# Patient Record
Sex: Female | Born: 1975 | Race: Black or African American | Hispanic: No | Marital: Single | Smoking: Current every day smoker
Health system: Southern US, Community
[De-identification: ages and names within clinical notes are randomized; demographics above are authoritative.]

## PROBLEM LIST (undated history)

## (undated) DIAGNOSIS — L732 Hidradenitis suppurativa: Secondary | ICD-10-CM

## (undated) DIAGNOSIS — M2550 Pain in unspecified joint: Secondary | ICD-10-CM

## (undated) DIAGNOSIS — M069 Rheumatoid arthritis, unspecified: Secondary | ICD-10-CM

## (undated) DIAGNOSIS — L0292 Furuncle, unspecified: Secondary | ICD-10-CM

## (undated) DIAGNOSIS — I10 Essential (primary) hypertension: Secondary | ICD-10-CM

## (undated) HISTORY — DX: Hidradenitis suppurativa: L73.2

## (undated) HISTORY — DX: Rheumatoid arthritis, unspecified: M06.9

## (undated) HISTORY — DX: Furuncle, unspecified: L02.92

## (undated) HISTORY — PX: AXILLARY HIDRADENITIS EXCISION: SUR522

## (undated) HISTORY — PX: OTHER SURGICAL HISTORY: SHX169

## (undated) NOTE — *Deleted (*Deleted)
Medical screening examination/treatment/procedure(s) were conducted as a shared visit with non-physician practitioner(s) and myself.  I personally evaluated the patient during the encounter  

---

## 1998-03-26 ENCOUNTER — Ambulatory Visit (HOSPITAL_COMMUNITY): Admission: RE | Admit: 1998-03-26 | Discharge: 1998-03-26 | Payer: Self-pay | Admitting: Obstetrics & Gynecology

## 1998-07-09 ENCOUNTER — Ambulatory Visit (HOSPITAL_COMMUNITY): Admission: RE | Admit: 1998-07-09 | Discharge: 1998-07-09 | Payer: Self-pay | Admitting: *Deleted

## 1998-08-20 ENCOUNTER — Inpatient Hospital Stay (HOSPITAL_COMMUNITY): Admission: AD | Admit: 1998-08-20 | Discharge: 1998-08-25 | Payer: Self-pay | Admitting: Obstetrics

## 1999-01-08 ENCOUNTER — Emergency Department (HOSPITAL_COMMUNITY): Admission: EM | Admit: 1999-01-08 | Discharge: 1999-01-08 | Payer: Self-pay | Admitting: Emergency Medicine

## 1999-03-31 ENCOUNTER — Emergency Department (HOSPITAL_COMMUNITY): Admission: EM | Admit: 1999-03-31 | Discharge: 1999-04-01 | Payer: Self-pay | Admitting: Emergency Medicine

## 1999-04-01 ENCOUNTER — Emergency Department (HOSPITAL_COMMUNITY): Admission: EM | Admit: 1999-04-01 | Discharge: 1999-04-01 | Payer: Self-pay | Admitting: Emergency Medicine

## 1999-06-03 ENCOUNTER — Emergency Department (HOSPITAL_COMMUNITY): Admission: EM | Admit: 1999-06-03 | Discharge: 1999-06-03 | Payer: Self-pay | Admitting: Emergency Medicine

## 1999-06-04 ENCOUNTER — Other Ambulatory Visit: Admission: RE | Admit: 1999-06-04 | Discharge: 1999-06-04 | Payer: Self-pay | Admitting: Obstetrics

## 1999-06-06 ENCOUNTER — Encounter (INDEPENDENT_AMBULATORY_CARE_PROVIDER_SITE_OTHER): Payer: Self-pay

## 1999-06-06 ENCOUNTER — Other Ambulatory Visit: Admission: RE | Admit: 1999-06-06 | Discharge: 1999-06-06 | Payer: Self-pay | Admitting: Obstetrics

## 2000-01-18 ENCOUNTER — Emergency Department (HOSPITAL_COMMUNITY): Admission: EM | Admit: 2000-01-18 | Discharge: 2000-01-18 | Payer: Self-pay | Admitting: Emergency Medicine

## 2000-02-14 ENCOUNTER — Ambulatory Visit (HOSPITAL_BASED_OUTPATIENT_CLINIC_OR_DEPARTMENT_OTHER): Admission: RE | Admit: 2000-02-14 | Discharge: 2000-02-14 | Payer: Self-pay | Admitting: General Surgery

## 2000-02-14 ENCOUNTER — Encounter (INDEPENDENT_AMBULATORY_CARE_PROVIDER_SITE_OTHER): Payer: Self-pay | Admitting: *Deleted

## 2003-04-03 ENCOUNTER — Ambulatory Visit (HOSPITAL_BASED_OUTPATIENT_CLINIC_OR_DEPARTMENT_OTHER): Admission: RE | Admit: 2003-04-03 | Discharge: 2003-04-03 | Payer: Self-pay | Admitting: Orthopedic Surgery

## 2003-04-03 ENCOUNTER — Encounter (INDEPENDENT_AMBULATORY_CARE_PROVIDER_SITE_OTHER): Payer: Self-pay | Admitting: *Deleted

## 2003-09-30 ENCOUNTER — Encounter: Admission: RE | Admit: 2003-09-30 | Discharge: 2003-09-30 | Payer: Self-pay | Admitting: Family Medicine

## 2004-01-28 ENCOUNTER — Encounter: Admission: RE | Admit: 2004-01-28 | Discharge: 2004-01-28 | Payer: Self-pay | Admitting: Family Medicine

## 2004-01-30 ENCOUNTER — Encounter: Admission: RE | Admit: 2004-01-30 | Discharge: 2004-01-30 | Payer: Self-pay | Admitting: Family Medicine

## 2004-05-06 ENCOUNTER — Encounter: Admission: RE | Admit: 2004-05-06 | Discharge: 2004-05-06 | Payer: Self-pay | Admitting: Sports Medicine

## 2004-07-01 ENCOUNTER — Encounter: Admission: RE | Admit: 2004-07-01 | Discharge: 2004-07-01 | Payer: Self-pay | Admitting: Family Medicine

## 2004-09-22 ENCOUNTER — Encounter (INDEPENDENT_AMBULATORY_CARE_PROVIDER_SITE_OTHER): Payer: Self-pay | Admitting: *Deleted

## 2004-09-22 LAB — CONVERTED CEMR LAB

## 2004-09-23 ENCOUNTER — Other Ambulatory Visit: Admission: RE | Admit: 2004-09-23 | Discharge: 2004-09-23 | Payer: Self-pay | Admitting: Family Medicine

## 2004-09-23 ENCOUNTER — Ambulatory Visit: Payer: Self-pay | Admitting: Family Medicine

## 2004-09-30 ENCOUNTER — Ambulatory Visit: Payer: Self-pay | Admitting: Family Medicine

## 2005-04-29 ENCOUNTER — Emergency Department (HOSPITAL_COMMUNITY): Admission: EM | Admit: 2005-04-29 | Discharge: 2005-04-29 | Payer: Self-pay | Admitting: Family Medicine

## 2006-04-24 ENCOUNTER — Emergency Department (HOSPITAL_COMMUNITY): Admission: EM | Admit: 2006-04-24 | Discharge: 2006-04-24 | Payer: Self-pay | Admitting: Emergency Medicine

## 2006-05-08 ENCOUNTER — Ambulatory Visit: Payer: Self-pay | Admitting: Sports Medicine

## 2006-05-12 ENCOUNTER — Encounter: Admission: RE | Admit: 2006-05-12 | Discharge: 2006-05-12 | Payer: Self-pay | Admitting: Sports Medicine

## 2006-05-12 ENCOUNTER — Ambulatory Visit: Payer: Self-pay | Admitting: Family Medicine

## 2006-06-23 ENCOUNTER — Emergency Department (HOSPITAL_COMMUNITY): Admission: EM | Admit: 2006-06-23 | Discharge: 2006-06-23 | Payer: Self-pay | Admitting: Family Medicine

## 2006-07-10 ENCOUNTER — Ambulatory Visit (HOSPITAL_BASED_OUTPATIENT_CLINIC_OR_DEPARTMENT_OTHER): Admission: RE | Admit: 2006-07-10 | Discharge: 2006-07-11 | Payer: Self-pay | Admitting: Specialist

## 2006-07-10 ENCOUNTER — Encounter (INDEPENDENT_AMBULATORY_CARE_PROVIDER_SITE_OTHER): Payer: Self-pay | Admitting: Specialist

## 2006-07-23 ENCOUNTER — Emergency Department (HOSPITAL_COMMUNITY): Admission: EM | Admit: 2006-07-23 | Discharge: 2006-07-23 | Payer: Self-pay | Admitting: Emergency Medicine

## 2006-08-03 ENCOUNTER — Ambulatory Visit: Payer: Self-pay | Admitting: Family Medicine

## 2006-08-03 ENCOUNTER — Inpatient Hospital Stay (HOSPITAL_COMMUNITY): Admission: EM | Admit: 2006-08-03 | Discharge: 2006-08-06 | Payer: Self-pay | Admitting: Emergency Medicine

## 2006-08-03 ENCOUNTER — Encounter: Payer: Self-pay | Admitting: Vascular Surgery

## 2006-08-16 ENCOUNTER — Ambulatory Visit: Payer: Self-pay | Admitting: Family Medicine

## 2006-09-11 ENCOUNTER — Ambulatory Visit: Payer: Self-pay | Admitting: Sports Medicine

## 2007-02-15 DIAGNOSIS — L732 Hidradenitis suppurativa: Secondary | ICD-10-CM | POA: Insufficient documentation

## 2007-02-16 ENCOUNTER — Encounter (INDEPENDENT_AMBULATORY_CARE_PROVIDER_SITE_OTHER): Payer: Self-pay | Admitting: *Deleted

## 2007-03-01 ENCOUNTER — Telehealth: Payer: Self-pay | Admitting: *Deleted

## 2007-10-11 ENCOUNTER — Encounter (INDEPENDENT_AMBULATORY_CARE_PROVIDER_SITE_OTHER): Payer: Self-pay | Admitting: Family Medicine

## 2007-10-11 ENCOUNTER — Ambulatory Visit: Payer: Self-pay | Admitting: Family Medicine

## 2007-10-12 ENCOUNTER — Telehealth (INDEPENDENT_AMBULATORY_CARE_PROVIDER_SITE_OTHER): Payer: Self-pay | Admitting: Family Medicine

## 2007-10-25 ENCOUNTER — Ambulatory Visit: Payer: Self-pay | Admitting: Family Medicine

## 2007-10-25 LAB — CONVERTED CEMR LAB
Blood Glucose, Fingerstick: 108
Whiff Test: NEGATIVE

## 2007-11-28 ENCOUNTER — Encounter: Payer: Self-pay | Admitting: Family Medicine

## 2007-11-28 ENCOUNTER — Ambulatory Visit: Payer: Self-pay | Admitting: Family Medicine

## 2008-01-23 ENCOUNTER — Encounter (INDEPENDENT_AMBULATORY_CARE_PROVIDER_SITE_OTHER): Payer: Self-pay | Admitting: *Deleted

## 2008-01-23 ENCOUNTER — Ambulatory Visit: Payer: Self-pay | Admitting: Family Medicine

## 2008-01-23 ENCOUNTER — Encounter (INDEPENDENT_AMBULATORY_CARE_PROVIDER_SITE_OTHER): Payer: Self-pay | Admitting: Family Medicine

## 2008-01-28 ENCOUNTER — Telehealth: Payer: Self-pay | Admitting: *Deleted

## 2008-04-16 ENCOUNTER — Encounter (INDEPENDENT_AMBULATORY_CARE_PROVIDER_SITE_OTHER): Payer: Self-pay | Admitting: Family Medicine

## 2008-04-22 ENCOUNTER — Encounter (INDEPENDENT_AMBULATORY_CARE_PROVIDER_SITE_OTHER): Payer: Self-pay | Admitting: Family Medicine

## 2008-07-10 ENCOUNTER — Ambulatory Visit: Payer: Self-pay | Admitting: Sports Medicine

## 2008-07-10 ENCOUNTER — Encounter (INDEPENDENT_AMBULATORY_CARE_PROVIDER_SITE_OTHER): Payer: Self-pay | Admitting: Family Medicine

## 2008-07-10 LAB — CONVERTED CEMR LAB
BUN: 13 mg/dL (ref 6–23)
Chloride: 104 meq/L (ref 96–112)
Glucose, Bld: 103 mg/dL — ABNORMAL HIGH (ref 70–99)
Pap Smear: NORMAL
Potassium: 4.3 meq/L (ref 3.5–5.3)
Sodium: 137 meq/L (ref 135–145)

## 2008-07-16 ENCOUNTER — Encounter (INDEPENDENT_AMBULATORY_CARE_PROVIDER_SITE_OTHER): Payer: Self-pay | Admitting: Family Medicine

## 2008-09-18 ENCOUNTER — Encounter: Payer: Self-pay | Admitting: Family Medicine

## 2008-09-18 ENCOUNTER — Telehealth: Payer: Self-pay | Admitting: *Deleted

## 2008-09-18 ENCOUNTER — Ambulatory Visit: Payer: Self-pay | Admitting: Family Medicine

## 2008-09-18 DIAGNOSIS — F17200 Nicotine dependence, unspecified, uncomplicated: Secondary | ICD-10-CM | POA: Insufficient documentation

## 2008-09-18 LAB — CONVERTED CEMR LAB
HCT: 36.4 % (ref 36.0–46.0)
MCV: 95.5 fL (ref 78.0–100.0)
RBC: 3.81 M/uL — ABNORMAL LOW (ref 3.87–5.11)
TSH: 3.174 microintl units/mL (ref 0.350–4.50)
WBC: 9.4 10*3/uL (ref 4.0–10.5)

## 2008-10-13 ENCOUNTER — Encounter (INDEPENDENT_AMBULATORY_CARE_PROVIDER_SITE_OTHER): Payer: Self-pay | Admitting: Family Medicine

## 2008-11-25 ENCOUNTER — Ambulatory Visit: Payer: Self-pay | Admitting: Family Medicine

## 2008-11-25 LAB — CONVERTED CEMR LAB: Whiff Test: NEGATIVE

## 2009-03-27 ENCOUNTER — Ambulatory Visit: Payer: Self-pay | Admitting: Family Medicine

## 2009-03-27 LAB — CONVERTED CEMR LAB: Beta hcg, urine, semiquantitative: NEGATIVE

## 2009-05-11 ENCOUNTER — Encounter (INDEPENDENT_AMBULATORY_CARE_PROVIDER_SITE_OTHER): Payer: Self-pay | Admitting: Family Medicine

## 2009-08-07 ENCOUNTER — Encounter: Payer: Self-pay | Admitting: Family Medicine

## 2009-08-27 ENCOUNTER — Ambulatory Visit: Payer: Self-pay | Admitting: Family Medicine

## 2009-10-13 ENCOUNTER — Ambulatory Visit: Payer: Self-pay | Admitting: Family Medicine

## 2009-11-05 ENCOUNTER — Ambulatory Visit: Payer: Self-pay | Admitting: Family Medicine

## 2010-02-16 ENCOUNTER — Ambulatory Visit: Payer: Self-pay | Admitting: Family Medicine

## 2010-02-16 LAB — CONVERTED CEMR LAB: Beta hcg, urine, semiquantitative: NEGATIVE

## 2010-03-10 ENCOUNTER — Encounter: Payer: Self-pay | Admitting: *Deleted

## 2010-03-10 ENCOUNTER — Encounter: Payer: Self-pay | Admitting: Family Medicine

## 2010-03-10 ENCOUNTER — Ambulatory Visit: Payer: Self-pay | Admitting: Family Medicine

## 2010-07-02 ENCOUNTER — Emergency Department (HOSPITAL_COMMUNITY): Admission: EM | Admit: 2010-07-02 | Discharge: 2010-07-02 | Payer: Self-pay | Admitting: Family Medicine

## 2010-10-13 ENCOUNTER — Emergency Department (HOSPITAL_COMMUNITY): Admission: EM | Admit: 2010-10-13 | Discharge: 2010-10-13 | Payer: Self-pay | Admitting: Emergency Medicine

## 2010-11-05 ENCOUNTER — Ambulatory Visit: Payer: Self-pay | Admitting: Family Medicine

## 2010-11-05 ENCOUNTER — Encounter: Payer: Self-pay | Admitting: Family Medicine

## 2010-11-05 DIAGNOSIS — L0292 Furuncle, unspecified: Secondary | ICD-10-CM | POA: Insufficient documentation

## 2010-11-05 DIAGNOSIS — T887XXA Unspecified adverse effect of drug or medicament, initial encounter: Secondary | ICD-10-CM | POA: Insufficient documentation

## 2010-11-05 DIAGNOSIS — L0293 Carbuncle, unspecified: Secondary | ICD-10-CM

## 2010-11-08 ENCOUNTER — Ambulatory Visit: Payer: Self-pay | Admitting: Family Medicine

## 2010-11-08 ENCOUNTER — Inpatient Hospital Stay (HOSPITAL_COMMUNITY): Admission: EM | Admit: 2010-11-08 | Discharge: 2010-11-12 | Payer: Self-pay | Admitting: Family Medicine

## 2010-11-10 ENCOUNTER — Telehealth: Payer: Self-pay | Admitting: Family Medicine

## 2010-11-15 ENCOUNTER — Encounter: Payer: Self-pay | Admitting: Family Medicine

## 2011-01-20 NOTE — Assessment & Plan Note (Signed)
Summary: boil on vag,df   Vital Signs:  Patient profile:   35 year old female Height:      64.5 inches Weight:      253 pounds BMI:     42.91 Temp:     98.4 degrees F oral BP sitting:   146 / 86  (right arm) Cuff size:   large  Vitals Entered By: Tessie Fass CMA (November 05, 2010 4:11 PM)  Primary Care Provider:  Magnus Ivan MD  CC:  Boils.  History of Present Illness:    History of multiple boils and hydradenitis bilat axilla, s/p multiple resections of skin for recurrent infections since age 33. presents with 3 days of acute boils in pubic region, groin and buttucks. 1 lesion beneath left breast which has been draining for 1 month, lesion on buttock currently draining. Boils around vagina very pain, tried a benzocaine topical  last pm and had an allergic reaction to the medication and now has redness over entire genital area extending to thighs.  In past has been on multiple antibiotics. Last course of doxyccyline was 1 month ago  Denies fever, chill, vomiting, no dysuria, no vaginal discharge using warm compresses  Current Medications (verified): 1)  Bactrim Ds 800-160 Mg Tabs (Sulfamethoxazole-Trimethoprim) .... 2 Tabs By Mouth Two Times A Day X 14 Days 2)  Loratadine 10 Mg Tabs (Loratadine) .Marland Kitchen.. 1 By Mouth Daily As Needed Itching 3)  Flagyl 500 Mg Tabs (Metronidazole) .Marland Kitchen.. 1 By Mouth Two Times A Day X 14 Days 4)  Vicodin 5-500 Mg Tabs (Hydrocodone-Acetaminophen) .Marland Kitchen.. 1 By Mouth Q 4hrs As Needed Pain 5)  Johnsons Baby Pure Cornstarch  Powd (Corn Starch) .... Apply To Affected Areas Two Times A Day  Large Bottle Equivalant  Allergies (verified): 1)  ! 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 Appears very uncomfortable Vital signs noted  Breasts:  1cm  boil beneath left breast open with minimal drainage, TTP, mild erythema Skin:  multiple small microabscess over mons pubis, bialt groin, over labia majora TTP no draining lesions +induration over entire mons pubis open cracking lesion in groin 1.5cm draining boil over right buttock large area of erythema extending over genital areas to cover 1/3 of bilateral thighs  Inguinal Nodes:  +nodes   Impression & Recommendations:  Problem # 1:  BOILS, RECURRENT (ICD-680.9) Assessment New   Recurrent multiple microabcess wth  few draining lesions, as area very extensive, will not open many different areas in the office. Start Bactrim and Flagyl to cover anaerobes, cornstarch powder, vicodin as needed, pain RTC monday for recheck. Given red flags for weekend for systemic symptoms. If no change by monday will admit for IV antibiotics, may need CT pelvis and surgical consult  Orders: [facility]- Est Level  3 (98119)  Problem # 2:  ADVERSE DRUG REACTION (ICD-995.20) Assessment: New  d/c benzocaine Start antihistamine. No topical treatment secondary to pain and multiple abscess.  Orders: [facility]- Est Level  3 (14782)  Complete Medication List: 1)  Bactrim Ds 800-160 Mg Tabs (Sulfamethoxazole-trimethoprim) .... 2 tabs by mouth two times a day x 14 days 2)  Loratadine 10 Mg Tabs (Loratadine) .Marland Kitchen.. 1 by mouth daily as needed itching 3)  Flagyl 500 Mg Tabs (Metronidazole) .Marland Kitchen.. 1 by mouth two times a day x 14 days 4)  Vicodin 5-500 Mg Tabs (Hydrocodone-acetaminophen) .Marland KitchenMarland KitchenMarland Kitchen  1 by mouth q 4hrs as needed pain 5)  Johnsons Baby Pure Cornstarch Powd (Corn starch) .... Apply to affected areas two times a day  large bottle equivalant  Patient Instructions: 1)  Take the antibiotics 2)  Use the pain medication  3)  For itching and the allergic reaction use the loratadine 4)  If things get worse or you have fever go to the ER 5)  Return for a visit on Monday- [facility] will be  there Prescriptions: JOHNSONS BABY PURE CORNSTARCH  POWD (CORN STARCH) apply to affected areas two times a day  Large bottle Equivalant  #1 x 1   Entered and Authorized by:   Milinda Antis MD   Signed by:   Milinda Antis MD on 11/05/2010   Method used:   Electronically to        [facility]* (retail)       [address]       [address] Shipping/mailing       [place], [place]  [address]       Ph: [phone]       Fax: [phone]   RxID:   [id number] VICODIN 5-500 MG TABS (HYDROCODONE-ACETAMINOPHEN) 1 by mouth q 4hrs as needed pain  #60 x 0   Entered and Authorized by:   Milinda Antis MD   Signed by:   Milinda Antis MD on 11/05/2010   Method used:   Handwritten   RxID:   [id number] FLAGYL 500 MG TABS (METRONIDAZOLE) 1 by mouth two times a day x 14 days  #28 x 0   Entered and Authorized by:   Milinda Antis MD   Signed by:   Milinda Antis MD on 11/05/2010   Method used:   Electronically to        [facility]* (retail)       [address]       [address] Shipping/mailing       [place], [place]  [address]       Ph: [phone]       Fax: [phone]   RxID:   [id number] LORATADINE 10 MG TABS (LORATADINE) 1 by mouth daily as needed itching  #30 x 3   Entered and Authorized by:   Milinda Antis MD   Signed by:   Milinda Antis MD on 11/05/2010   Method used:   Electronically to        [facility]* (retail)       [address]       [address] Shipping/mailing       [place], [place]  [address]       Ph: [phone]       Fax: [phone]   RxID:   [id number] BACTRIM DS 800-160 MG TABS (SULFAMETHOXAZOLE-TRIMETHOPRIM) 2 tabs by mouth two times a day x 14 days  #56 x 0   Entered and Authorized by:   Milinda Antis MD   Signed by:   Milinda Antis MD on 11/05/2010   Method used:   Electronically to        [facility]* (retail)       [address]       [address] Shipping/mail[address]       [place], [place]  [address]       Ph: [phone]       Fax: [phone]   RxID:  [id number]    Orders Added: 1)  [facility]- Est Level  3 [[address]]

## 2011-01-20 NOTE — Assessment & Plan Note (Signed)
Summary: depo inj,tcb  Nurse Visit   Laboratory Results   Urine Tests  Date/Time Received: February 16, 2010 9:17 AM  Date/Time Reported: February 16, 2010 9:25 AM     Urine HCG: negative Comments: ...............test performed by......Marland KitchenBonnie A. [place], MLS (ASCP)cm     Medication Administration  Injection # 1:    Medication: Depo-Provera 150mg     Diagnosis: CONTRACEPTIVE MANAGEMENT (ICD-V25.09)    Route: IM    Site: R deltoid    Exp Date: 04/2012    Lot #: [id number]    Mfr: [email or web address]    Comments: next Depo due May 17 thru May 18, 2010    Patient tolerated injection without complications    Given by: Theresia Lo RN (February 16, 2010 10:05 AM)  Orders Added: 1)  U Preg-[facility] [81025] 2)  Depo-Provera 150mg  [J1055] 3)  Admin of Injection (IM/SQ) [04540]    Medication Administration  Injection # 1:    Medication: Depo-Provera 150mg     Diagnosis: CONTRACEPTIVE MANAGEMENT (ICD-V25.09)    Route: IM    Site: R deltoid    Exp Date: 04/2012    Lot #: [id number]    Mfr: greenstone    Comments: next Depo due May 17 thru May 18, 2010    Patient tolerated injection without complications    Given by: Theresia Lo RN (February 16, 2010 10:05 AM)  Orders Added: 1)  U Preg-[facility] [81025] 2)  Depo-Provera 150mg  [J1055] 3)  Admin of Injection (IM/SQ) [47829]  patient advised to use extra protection with condoms for next 7 days. she reports her last sex was more than one month ago so will not need to repeat upreg in 2 weeks. Theresia Lo RN  February 16, 2010 10:07 AM

## 2011-01-20 NOTE — Assessment & Plan Note (Signed)
Summary: f/u per ta/kh   Vital Signs:  Patient profile:   35 year old female Height:      64.5 inches Weight:      250 pounds BMI:     42.40 Temp:     98.1 degrees F oral BP sitting:   146 / 84  (right arm) Cuff size:   large  Vitals Entered By: Tessie Fass CMA (November 08, 2010 4:33 PM) CC: F/U  Boils Pain Assessment Patient in pain? yes        Primary Care Kendallyn Lippold:  Edd Arbour  CC:  F/U  Boils.  History of Present Illness:    History of multiple boils and hydradenitis bilat axilla, s/p multiple resections of skin for recurrent infections since age 48. Pt presented last week with multiple boils/abscesses in vaginal area, 1 lesion on buttock and 1 draining lesion beneath left breast. Pt also had a allergic reaction to Benzocaine , a topical OTC agent she put on lesions last thursay, given Loratadine, with clearance in erythema. Pt was started on Bactrim and Flagyl / warm compresses and returned for f/u today. Per pt there has been no change in lesions, typically they resolve in 2-3 days, she has been more fatigued lately and not feeling well.   Denies fever, chills, vomiting, no dysuria, no vaginal discharge, no chest pain, no SOB   Current Medications (verified): 1)  Bactrim Ds 800-160 Mg Tabs (Sulfamethoxazole-Trimethoprim) .... 2 Tabs By Mouth Two Times A Day X 14 Days 2)  Loratadine 10 Mg Tabs (Loratadine) .Marland Kitchen.. 1 By Mouth Daily As Needed Itching 3)  Flagyl 500 Mg Tabs (Metronidazole) .Marland Kitchen.. 1 By Mouth Two Times A Day X 14 Days 4)  Vicodin 5-500 Mg Tabs (Hydrocodone-Acetaminophen) .Marland Kitchen.. 1 By Mouth Q 4hrs As Needed Pain 5)  Johnsons Baby Pure Cornstarch  Powd (Corn Starch) .... Apply To Affected Areas Two Times A Day  Large Bottle Equivalant  Allergies (verified): 1)  ! 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 Works for [employer]. 6 ppy h/o smoking.  Smoking 1pack per week.   social ETOH, no drug, sexually active-protected.  (08/27/2009):  pt has stopped smoking for 2 months.  prior to cessation, patient smoked for 8 years, 1 pack/week.  patient also started going to the gym.  patient also drinks on the weekends and will drink 4-5 drinks at a sitting.  no illegal drug use.  Review of Systems       Per HPI  Physical Exam  General:  Obese. Appears very uncomfortable Vital signs noted, BP 146/84  Mouth:  MMM, no oral lesions Breasts:  1cm boil beneath left breast open with minimal drainage, TTP, mild erythema Lungs:  CTAB Heart:  RRR, no murmur Abdomen:  soft, non-tender, normal bowel sounds, and no distention.   Pulses:  radial pulse 2+ Extremities:  no edema Skin:  multiple small microabscess over mons pubis, bialt groin, over labia majora,  +swelling Labia Majora L >  R- worsened since initial exam TTP no draining lesions +induration over entire mons pubis open cracking lesion in groin 1.5cm draining boil over right buttock     Impression & Recommendations:  Problem # 1:  BOILS, RECURRENT (ICD-680.9) Assessment Deteriorated Will admit for failed outpatient treatment of recurrent boils/ hydradentitis suppurative, will need IV antibiotics - Vancomycin/Zosyn to broaden coverage. Obtain CT pelvis, to evaluate abscess depth. Will consult [facility] for recommendations Pain  control MRSA swab, contact precautions No evidence of sepsis at this time, vitals stable for pt to transport self to hospital with family member, check stat labs   Problem # 2:  ADVERSE DRUG REACTION (ICD-995.20) Assessment: Improved reaction to benzocaine over pubic and thigh area has cleared, will continue anti-histamine.   Problem # 3:  FEN/GI by mouth ad lib HLIVF Check lytes and CBC  Prophylaxis Heparin 5000 units subq 8hrs    Dispo- pending clinical improvement, surgical intervention  Complete Medication List: 1)  Bactrim Ds 800-160 Mg Tabs (Sulfamethoxazole-trimethoprim) .... 2 tabs by mouth two times a day x 14 days 2)  Loratadine 10 Mg Tabs (Loratadine) .Marland Kitchen.. 1 by mouth daily as needed itching 3)  Flagyl 500 Mg Tabs (Metronidazole) .Marland Kitchen.. 1 by mouth two times a day x 14 days 4)  Vicodin 5-500 Mg Tabs (Hydrocodone-acetaminophen) .Marland Kitchen.. 1 by mouth q 4hrs as needed pain 5)  Johnsons Baby Pure Cornstarch Powd (Corn starch) .... Apply to affected areas two times a day  large bottle equivalant      Appended Document: Orders Update    Clinical Lists Changes  Orders: Added new Test order of [facility]- Est Level  5 (14782) - Signed

## 2011-01-20 NOTE — Letter (Signed)
Summary: Out of Work  [facility]  [address]   [place], [place] [address]   Phone: [phone]  Fax: [phone]    March 10, 2010   Employee:  Rachel Mckinney    To Whom It May Concern:   For Medical reasons, please excuse the above named employee from work for the following dates:  Start:   March 10, 2010    End:   March 10, 2010  If you need additional information, please feel free to contact our office.         Sincerely,    Loralee Pacas CMA

## 2011-01-20 NOTE — Assessment & Plan Note (Signed)
Summary: boil on buttocks,df   Vital Signs:  Patient profile:   35 year old female Weight:      253.7 pounds Temp:     98.6 degrees F oral Pulse rate:   79 / minute Pulse rhythm:   regular BP sitting:   141 / 84 Cuff size:   large  Vitals Entered By: Loralee Pacas CMA (March 10, 2010 4:08 PM)  Primary Care Provider:  Magnus Ivan MD  CC:  boil.  History of Present Illness: 35 y/o female with h/o significant difficulty with hidradenitis, presents complaining of boil on L buttock. denies fever, chills, N/V. it ruptured this a.m. and has had serous drainage since then. has required I&D and surgical treatment in the past. currently on doxycycline for suppressive therapy.   Current Medications (verified): 1)  Tretinoin 0.05 % Crea (Tretinoin) .... Apply To Affected Areas At Night 2)  Doxycycline Hyclate 100 Mg Solr (Doxycycline Hyclate) .... Take 1 Pill Two Times A Day For Two Weeks, Then Take 1 Pill Once A Day By Mouth 3)  Triamcinolone Acetonide 0.025 % Crea (Triamcinolone Acetonide) .... Apply To Rectal Area At Night  Allergies (verified): No Known Drug Allergies  Physical Exam  General:  NAD, obese vital signs noted Skin:  L buttock with 1x1 cm area of induration. no drainage or surrounding erythema. +TTP. central area of rupture.     Impression & Recommendations:  Problem # 1:  HYDRADENITIS (ICD-705.83) Assessment Deteriorated area on buttock without fluctuance. no I&D necessary. will change to bactrim since no change with doxycyline.  wound culture obtained.   Orders: Culture, Wound -[facility] ([address]) Gram Stain-[facility] ([address]) [facility]- Est Level  3 ([address]) Prescriptions: BACTRIM DS 800-160 MG TABS (SULFAMETHOXAZOLE-TRIMETHOPRIM) two tabs by mouth two times a day x14 days  #56 x 0   Entered and Authorized by:   Lequita Asal  MD   Signed by:   Lequita Asal  MD on 03/10/2010   Method used:   Electronically to        [id number] (retail)  [address]       [place], [place]  [address]       Ph: [phone] or [phone]       Fax: [phone]   RxID:   [id number]

## 2011-01-20 NOTE — Progress Notes (Signed)
Summary: phn msg  Phone Note Call from Patient   Caller: Patient Summary of Call: pt needs documentation that she is in the hospital for her FMLA to start needs to be faxed to HR- Wyman Songster [phone] Initial call taken by: De Nurse,  November 10, 2010 1:52 PM  Follow-up for Phone Call        I created a letter stating her info.  thank you Follow-up by: Edd Arbour,  November 15, 2010 10:26 AM

## 2011-01-20 NOTE — Letter (Signed)
Summary: Generic Letter  [facility]  [address]   [place], [place] [address]   Phone: [phone]  Fax: [phone]    11/15/2010 MRN: [id number]  [address] [place], [place]  [address]  Dear Mrs. Yetta Barre,   DATE OF ADMISSION:  11/08/2010   DATE OF DISCHARGE:  11/12/2010                                  DISCHARGE DIAGNOSES:   1. Labial and perirectal abscesses status post incision and drainage   2. Hidradenitis suppurativa.   3. Obesity.    to be faxed to HR- Wyman Songster - [phone]   Edd Arbour, M.D. [facility]  Appended Document: Generic Letter mailed

## 2011-01-20 NOTE — Letter (Signed)
Summary: Out of Work  [facility]  [address]   [place], [place] [address]   Phone: [phone]  Fax: [phone]    November 05, 2010   Employee:  BRITTNAY PIGMAN    To Whom It May Concern:   For Medical reasons, please excuse the above named employee from work for the following dates:   November 05, 2010  through November 08, 2010.    If you need additional information, please feel free to contact our office.         Sincerely,    Milinda Antis MD

## 2011-01-27 ENCOUNTER — Encounter: Payer: Self-pay | Admitting: *Deleted

## 2011-03-01 LAB — CBC
HCT: 34.5 % — ABNORMAL LOW (ref 36.0–46.0)
HCT: 35.3 % — ABNORMAL LOW (ref 36.0–46.0)
Hemoglobin: 11.6 g/dL — ABNORMAL LOW (ref 12.0–15.0)
Hemoglobin: 11.7 g/dL — ABNORMAL LOW (ref 12.0–15.0)
MCH: 30.9 pg (ref 26.0–34.0)
MCHC: 32.3 g/dL (ref 30.0–36.0)
MCHC: 32.8 g/dL (ref 30.0–36.0)
MCHC: 33.6 g/dL (ref 30.0–36.0)
MCV: 95.4 fL (ref 78.0–100.0)
Platelets: 247 10*3/uL (ref 150–400)
Platelets: 265 10*3/uL (ref 150–400)
Platelets: 278 10*3/uL (ref 150–400)
RBC: 3.5 MIL/uL — ABNORMAL LOW (ref 3.87–5.11)
RBC: 3.68 MIL/uL — ABNORMAL LOW (ref 3.87–5.11)
RDW: 12.6 % (ref 11.5–15.5)
RDW: 12.6 % (ref 11.5–15.5)
RDW: 12.7 % (ref 11.5–15.5)
RDW: 13 % (ref 11.5–15.5)
WBC: 11 10*3/uL — ABNORMAL HIGH (ref 4.0–10.5)
WBC: 14.4 10*3/uL — ABNORMAL HIGH (ref 4.0–10.5)
WBC: 9.4 10*3/uL (ref 4.0–10.5)
WBC: 9.9 10*3/uL (ref 4.0–10.5)

## 2011-03-01 LAB — CULTURE, ROUTINE-ABSCESS

## 2011-03-01 LAB — DIFFERENTIAL
Basophils Absolute: 0 10*3/uL (ref 0.0–0.1)
Basophils Absolute: 0 10*3/uL (ref 0.0–0.1)
Eosinophils Absolute: 0.2 10*3/uL (ref 0.0–0.7)
Eosinophils Relative: 2 % (ref 0–5)
Lymphocytes Relative: 11 % — ABNORMAL LOW (ref 12–46)
Monocytes Absolute: 0.9 10*3/uL (ref 0.1–1.0)
Neutro Abs: 11.9 10*3/uL — ABNORMAL HIGH (ref 1.7–7.7)

## 2011-03-01 LAB — ANAEROBIC CULTURE

## 2011-03-01 LAB — BASIC METABOLIC PANEL
BUN: 12 mg/dL (ref 6–23)
CO2: 23 mEq/L (ref 19–32)
Calcium: 8.8 mg/dL (ref 8.4–10.5)
Calcium: 8.9 mg/dL (ref 8.4–10.5)
Chloride: 105 mEq/L (ref 96–112)
Creatinine, Ser: 0.95 mg/dL (ref 0.4–1.2)
GFR calc Af Amer: >60 mL/min (ref >60)
GFR calc Af Amer: >60 mL/min (ref >60)
GFR calc non Af Amer: >60 mL/min (ref >60)
Glucose, Bld: 82 mg/dL (ref 70–99)
Potassium: 4.5 mEq/L (ref 3.5–5.1)
Sodium: 135 mEq/L (ref 135–145)

## 2011-03-01 LAB — WOUND CULTURE

## 2011-05-06 NOTE — H&P (Signed)
NAME:  Rachel Mckinney, Rachel Mckinney NO.:  [id number]   MEDICAL RECORD NO.:  [id number]          PATIENT TYPE:  INP   LOCATION:  5128                         FACILITY:  [facility]   PHYSICIAN:  Rolm Gala, M.D.    DATE OF BIRTH:  03/17/1976   DATE OF ADMISSION:  08/02/2006  DATE OF DISCHARGE:                                HISTORY & PHYSICAL   The patient is a 35 year old female, with a history of hydradenitis/boils,  who had surgery on July 23 by Dr. Aurelio Jew (Plastics) after being told by  [facility] that they needed to refer.  Since the surgery the  patient's wound have been opening up.  The sutures have been coming undone.  She has had a low-grade fever of 99.9 and has generally been feeling  awful/anxious.  The patient is also complaining of bilateral leg and knee  pain.  She has been on bedrest after recent surgery with new nonproductive  cough.  No chest pain.  Increased shortness of breath getting out of the  shower.  Also new erythema and induration on bilateral legs came up while on  antibiotics.   PROBLEM LIST:  1. Hydradenitis/boils with surgery on July 23.  2. History of anemia.  3. Obesity.   CURRENT MEDICATIONS:  1. Bactrim DS 1 p.o. daily.  2. Depo-Provera every 3 months until IUD.  3. Oxycodone #30 prescribed August 8, and she has only has 3-4 left.   ALLERGIES:  No known drug allergies.   FAMILY HISTORY:  Dad with hypertension and kidney disease.   SOCIAL HISTORY:  The patient lives in [place] with 57-year-old son, but  they are living with her mother for the past month after the surgery.  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  She smoked for 6-7 years, but she  quit about a month ago.  Social alcohol.  No drugs.   PHYSICAL EXAMINATION:  VITAL SIGNS:  Temperature 99.4, pulse 102 which came  down to 98, blood pressure 133/87 which came down to 102/62 and 100% on room  air.  Respirations 18.  GENERAL:  The patient was generally  anxious appearing.  HEENT:  Head is traumatic.  Pupils are equal, round and reactive to light.  TMs clear.  Oropharynx is clear.  The patient had scars on her breasts  secondary to history of hydradenitis.  LUNGS:  Clear to auscultation bilaterally.  HEART:  Regular rate and rhythm.  No murmurs.  ABDOMEN:  Soft and nontender.  Positive bowel sounds.  Stretch marks.  EXTREMITIES:  No clubbing, cyanosis or edema.  __________  to evaluate  calves.  It was the same diameter.  No Homans' sign, but the patient said  she is dosed with  Dilaudid and does not feel much.  Pulses 2+, bilateral  pedal pulses.  SKIN:  Right lower leg with 5-cm-x-5-cm erythematous indurated area.  A  similar area on left lower leg.  Right armpit with open wound.  No foul  smell.  No clear drainage of pus.  It just seemed to be coming out.  In  genital  area the patient has open area in both groin creases with some of  the stitching still intact in her buttock area.  She also has quite a large  open area with serous drainage.   LABORATORY:  Sodium 136, potassium 4.3, chloride 105, bicarb 22, BUN 7,  creatinine 1, glucose 82, calcium 9.3.  Alk phos 172.  Other LFTs within  normal limits.  Blood cultures x2 pending.  Abscess culture pending.  White  blood cells 9.8.  H&H 9.5/28.5.  Platelets 426.  MCV 88.7.  RDW 16%.  ANC  6.6.   In the ER the patient received Unasyn 3 gm, vancomycin 500 mg and Dilaudid 2  mg.   PROBLEM LIST:  1. Hydradenitis, failed outpatient treatment.  We will consult Dr.      Aurelio Jew and his team in the morning.  We will consult Wound Care.      Patient with low-grade fever and increased platelets but normal white      blood cells without an ANC.  Patient on Septra but if MRSA is part of      this the patient is not on treatment dose which is 2 double strength      b.i.d.  I am concerned that the infection she has now may be resistant,      especially since she has had new areas popping up  since she has been on      the antibiotic.  The patient received vanc in the ER.  We will switch      her to doxy 100 b.i.d. in the morning and discuss antibiotics with      teams and surgeon.  The patient is using a lot of pain medicine, and we      will give her Vicodin here but with MiraLax to prevent against      constipation daily.  Blood cultures are pending.  2. Anemia.  We will check ferritin level.  Normal MCV may be secondary to      surgery and with RDW being large, the patient is __________ .  We will      check a count.  3. Leg pain.  My main concern here is for a DVT with surgery and bedrest.      The patient is at high risk for the __________  criteria; however, she      does not have any chest pain.  We will get a D-dimer and Dopplers of      the lower extremities in the morning.  Currently, we will place the      patient on prophylaxis dose of Lovenox given no chest pain and no      change in calf size.  4. Alkaline phosphatase, bone verus liver.  Likely recheck as an      outpatient.  5. Dispo:  When the patient has resources needed for outpatient treatment,      likely home health.      Rolm Gala, M.D.     Bennetta Laos  D:  08/03/2006  T:  08/03/2006  Job:  [id number]

## 2011-05-06 NOTE — Discharge Summary (Signed)
NAME:  Rachel Mckinney, Rachel Mckinney NO.:  [id number]   MEDICAL RECORD NO.:  [id number]          PATIENT TYPE:  INP   LOCATION:  5121                         FACILITY:  [facility]   PHYSICIAN:  Johney Maine, M.D.   DATE OF BIRTH:  [birth year]   DATE OF ADMISSION:  08/02/2006  DATE OF DISCHARGE:  08/06/2006                                 DISCHARGE SUMMARY   DISCHARGE CONDITION:  This patient was discharged in improved and stable  condition on August 06, 2006.   ADMITTING DIAGNOSES:  1. Hydradenitis.  2. Anemia.  3. Leg pain.  4. Elevated alkaline phosphatase.   DISCHARGE DIAGNOSES:  1. Hydradenitis.  2. Erythema nodosum.  3. Anemia.  4. Malnutrition.   DISCHARGE MEDICATIONS:  1. Lortab 5/500 take 1-2 tablets p.o. q. 6 hours p.r.n. pain.  2. MS Contin 50 mg take 1 tablet p.o. b.i.d.  3. Doxycycline 100 mg take 1 pill p.o. b.i.d. for 11 days.  4. MiraLax 17 grams dissolve 1 packet into juice daily if constipated,      p.r.n. constipation.   DISCHARGE INSTRUCTIONS:  The patient will return to see primary care  Ferrah Panagopoulos Dr. Raquel James at [facility].  The patient  will call on Monday, August 07, 2006 for appointment in 1-2 weeks.  Phone  number is [phone].  The patient will also return to see Dr. Shon Hough, of  plastic surgery and she has a scheduled appointment on Wednesday, August 09, 2006.  Will followup as needed with him.  The patient while here was [facility] up  with [facility] to help with her wound dressings.  They will manage  her wound care until they heal.   HOSPITAL COURSE:  This patient is a 35 year old female.  The family brought  the patient, who arrived and was admitted in the emergency room with a  history of hydradenitis.  The patient had her most recent surgery for  hydradenitis on July 10, 2006 by Dr. Shon Hough.  Since then, the wounds that  were sutured together have been opening up.  The patient has had a low grade  fever at  home.  She arrives in the emergency room and was admitted because  we were afraid the wounds were beginning to be infected.   Hydradenitis/wound.  Upon admission, after careful inspection and thorough  inspection of the wound, it appeared that the wound was not severely  infected at this point.  There was no erythema although there was some green  discharge.  We felt that the medication of doxycycline would help this  infection.  We did not feel it was necessary to use vancomycin which  initially was one of the thoughts we had.  The patient remained afebrile  throughout this entire hospital stay and her white blood cell counts  remained relatively normal throughout her stay and she improved.  As for her  wounds, the hydradenitis wounds, we consulted the wound care team visited  the patient and also felt that the wounds were not actively infected  although they were afraid that they could become infected  in the future.  Because of this, they began dressings with calcicare and very careful  management of the wound.  They recommended that the patient shower twice a  day which is also a recommendation of Dr. Shon Hough in the hospital when she  returns home.  She can bathe as previously instructed by Dr. Shon Hough, once  in the morning, once in the evening.  The wound care team was able to help  the patient and her wounds began to feel better by the day of admission.  We  arranged for [facility] to visit her at home and also help  her with her wound care.  She will continue to receive the calcicare for  wound dressings while she is at home.  While she was in the hospital, we  placed the Foley catheter just to prevent additional pain while urinating.  However, we felt that because the patient was young and healthy she would  not need this Foley catheter at home and was not discharged with the Foley  in place.   Leg pain.  During the course of the hospital stay, the patient  developed  interesting physical findings.  She developed a rash on her lower  extremities.  The rash consisted of circular purple lesions that were very  tender to palpation.  There were also areas on her legs that were tender to  palpation that were not discolored.  We diagnosed this leg pain as erythema  nodosum.  We treated this as we would for erythema nodosum with pain  management and discontinuing the agent that would cause it.  It is unknown  exactly why she has erythema nodosum.  However, one reason we suspect was  related to the previous antibiotic of Septra.  She was managed as an  outpatient.  This was discontinued although even after discontinuation, she  continued to have more of these lesions appear.  Also erythema has multiple  etiologies including some autoimmune rheumatologic diseases.  Because of  this we tested an ANA and other autoimmune laboratories which we will  discuss later in this dictation.  Some of the labs are still pending  including the ANA and she will be followed up as an outpatient if any of  these labs become abnormal.  So far, all labs including complement,  heptoglobin, LDH and peripheral blood smear have been normal.  On the day of  discharge, the patient did not have any more lesions and her pain was a  little more well controlled.  During her hospital stay, it was noted that  one day she had gross hematuria in her urine.  We were concerned that the  erythema dose might be related to a more severe allergic reaction.  However,  this hematuria cleared and her creatinine remained normal on day of  discharge and they are not concerned about this.   Anemia.  The patient has a history of anemia.  She believes it is iron  deficiency.  However during her hospital stay, her MCV was normal which made  [place] believe it was not an iron deficiency anemia.  We worked her up for several hemolytic blood diseases, however all findings and laboratories are  normal.  We  will followup as an outpatient.  It is possible that she has an  iron deficiency anemia however because the patient was taking iron at home  it may not have shown up on her laboratories.  It is also possible that she  has an underlying autoimmune rheumatologic disease that is causing anemia,  for example lupus.  The patient will be followed up as an outpatient and  over the course of her hospital stay, her hemoglobin actually improved.  The  patient did not need a blood transfusion and was asymptomatic throughout the  entire time.   Pain management.  The patient's pain was well managed throughout her entire  hospital stay.  In fact, the wounds of the hydradenitis were less painful  than the erythema nodosum lesions.  The patient was controlled with MS  Contin 50 mg p.o. b.i.d. along with Lortab 1-2 tablets q. 4-6 hours p.r.n.  pain.  Because she responded well to this regimen, we discharged her home  with the MS Contin and the Lortab medications without any refills and a  limited supply of these pills.  She will followup in 1-10 days with Dr.  Raquel James.  Dr. Raquel James can decide if she needs to refill any of these pain  medications.  She will also followup with a plastic surgeon, Dr. Shon Hough  on Wednesday, August 09, 2006.  He may have further recommendations for pain  management.  Overall, the patient's pain was well controlled and on  discharge day she felt improved compared to when she came in.   Malnutrition.  This patient was noted to have a very low albumin level.  Some of this was related to the open wounds, however the patient on  interviewing was noted that she does not have a very good understanding of  adequate nutrition.  We consulted nutrition/dieticians in house.  They came  by and had several recommendations including a drink called Genuva to help  with protein.  The patient was instructed to drink milk and eats lots of  meat to help with her protein.  Several other  dietary recommendations and  education was provided to the patient and she was given samples of Genuva.   Overall, this patient was much improved and left in stable condition on  August 06, 2006.  Multiple labs are pending including ANA, a G6TB test and a  total complement test are still pending.  These labs will be followed as an  outpatient and Dr. Raquel James or Dr. Rosann Auerbach will review these with the patient.  If they are abnormal the patient will followup as previously discussed with  Dr. Raquel James and Dr. Shon Hough, the plastic surgeon to go over the future  medical care for hydradenitis.   IMAGES:  He had a Doppler of the lower extremities performed for this  patient when she presented.  It was initially unable to differentiate  whether the pain she was having was calf pain or anterior leg pain.  Dopplers were negative for DVT, Bakers cyst and/or superficial vein thrombosis.  That was performed on August 03, 2006.  No other imaging needed  during this time during this hospital stay.   IMPORTANT LAB VALUES:  Initially this patient on day of admission had a  normal white count of 10.2, hemoglobin 9.3, hematocrit 27.4, platelets 433.  Please note that the hemoglobin was low at 9.3 and the platelets were high  at 433.  Her initial comprehensive metabolic panel was normal except for an  elevated alkaline phosphatase of 127.  Liver enzymes AST and ALT were  normal.  The patient did have an elevated D-dimer on initial admission date  of 0.66.  Over the course of the hospital as mentioned previously we were  concerned about anemia.  We checked a ferritin level which was normal at  222.  We checked a reticulocyte count which was normal at 1.5.  However we  normally would expect it to be higher given the patient did have anemia and  did not appear she is making adequate red blood cells at this point.  As  mentioned previously, we will work this up further.  The patient's white  blood cell count  remained relatively stable throughout her hospital stay.  However on August 05, 2006 it was mildly elevated at 11.9, however the  patient remained afebrile at this time.  Also over the course of hospital  stay, we checked an ESR which was elevated at 121.  C reactive protein which  is elevated at 7.8.  These are acute phase reactants however they can be  seen with an autoimmune underlying rheumatologic diseases which is why we  did further tests.  The patient's albumin was very low at 10.7.  LDH was low  normal at 91.  Peripheral blood smear was within normal limits.  Heptoglobin  was high at 526.  Fecal occult blood negative.  ASO titer normal at 43.  C4  complement normal at 27.  Basic metabolic panel on day of discharge was  completely normal with the exception of albumin which was low at 2.9 and her  alkaline phosphatase was back to being normal.  A urinalysis on August 05, 2006 was positive for large amount of red blood cells.  Negative for  protein.  Small amount of leukocytes.  Negative for nitrates.  Cultures are  pending.  Blood cultures so far are only positive for diphtheria.  No MRSA.   DISCHARGE CONDITION:  This patient was discharged in improved and stable  condition on August 06, 2006.   ADMITTING DIAGNOSES:  1. Hydradenitis.  2. Anemia.  3. Leg pain.  4. Elevated alkaline phosphatase.   DISCHARGE DIAGNOSES:  1. Hydradenitis.  2. Erythema nodosum.  3. Anemia.  4. Malnutrition.   DISCHARGE MEDICATIONS:  1. Lortab 5/500 take 1-2 tablets p.o. q. 6 hours p.r.n. pain.  2. MS Contin 50 mg take 1 tablet p.o. b.i.d.  3. Doxycycline 100 mg take 1 pill p.o. b.i.d. for 11 days.  4. MiraLax 17 grams dissolve 1 packet into juice daily if constipated,      p.r.n. constipation.   DISCHARGE INSTRUCTIONS:  The patient will return to see primary care  Luzmaria Devaux Dr. Raquel James at [facility].  The patient will call on Monday, August 07, 2006 for appointment in  1-2 weeks.  Phone  number is [phone].  The patient will also return to see Dr. Shon Hough, of  plastic surgery and she has a scheduled appointment on Wednesday, August 09, 2006.  Will followup as needed with him.  The patient while here was [facility] up  with [facility] to help with her wound dressings.  They will manage  her wound care until they heal.   HOSPITAL COURSE:  This patient is a 35 year old female.  The family brought  the patient, who arrived and was admitted in the emergency room with a  history of hydradenitis.  The patient had her most recent surgery for  hydradenitis on July 10, 2006 by Dr. Shon Hough.  Since then, the wounds that  were sutured together have been opening up.  The patient has had a low grade  fever at home.  She arrives in the emergency room and was admitted because  we were afraid the wounds were beginning to be infected.   Hydradenitis/wound.  Upon admission, after careful inspection and thorough  inspection of the wound, it appeared that the wound was not severely  infected at this point.  There was no erythema although there was some green  discharge.  We felt that the medication of doxycycline would help this  infection.  We did not feel it was necessary to use vancomycin which  initially was one of the thoughts we had.  The patient remained afebrile  throughout this entire hospital stay and her white blood cell counts  remained relatively normal throughout her stay and she improved.  As for her  wounds, the hydradenitis wounds, we consulted the wound care team visited  the patient and also felt that the wounds were not actively infected  although they were afraid that they could become infected in the future.  Because of this, they began dressings with calcicare and very careful  management of the wound.  They recommended that the patient shower twice a  day which is also a recommendation of Dr. Shon Hough in the hospital when she  returns home.   She can bathe as previously instructed by Dr. Shon Hough, once  in the morning, once in the evening.  The wound care team was able to help  the patient and her wounds began to feel better by the day of admission.  We  arranged for [facility] to visit her at home and also help  her with her wound care.  She will continue to receive the calcicare for  wound dressings while she is at home.  While she was in the hospital, we  placed the Foley catheter just to prevent additional pain while urinating.  However, we felt that because the patient was young and healthy she would  not need this Foley catheter at home and was not discharged with the Foley  in place.   Leg pain.  During the course of the hospital stay, the patient developed  interesting physical findings.  She developed a rash on her lower  extremities.  The rash consisted of circular purple lesions that were very tender to palpation.  There were also areas on her legs that were tender to  palpation that were not discolored.  We diagnosed this leg pain as erythema  nodosum.  We treated this as we would for erythema nodosum with pain  management and discontinuing the agent that would cause it.  It is unknown  exactly why she has erythema nodosum.  However, one reason we suspect was  related to the previous antibiotic of Septra.  She was managed as an  outpatient.  This was discontinued although even after discontinuation, she  continued to have more of these lesions appear.  Also erythema has multiple  etiologies including some autoimmune rheumatologic diseases.  Because of  this we tested an ANA and other autoimmune laboratories which we will  discuss later in this dictation.  Some of the labs are still pending  including the ANA and she will be followed up as an outpatient if any of  these labs become abnormal.  So far, all labs including complement,  heptoglobin, LDH and peripheral blood smear have been normal.  On the  day of  discharge, the patient did not have any more lesions and her pain was a  little more well controlled.  During her hospital stay, it was noted that  one day she had gross  hematuria in her urine.  We were concerned that the  erythema dose might be related to a more severe allergic reaction.  However,  this hematuria cleared and her creatinine remained normal on day of  discharge and they are not concerned about this.   Anemia.  The patient has a history of anemia.  She believes it is iron  deficiency.  However during her hospital stay, her MCV was normal which made  [place] believe it was not an iron deficiency anemia.  We worked her up for  several hemolytic blood diseases, however all findings and laboratories are  normal.  We will followup as an outpatient.  It is possible that she has an  iron deficiency anemia however because the patient was taking iron at home  it may not have shown up on her laboratories.  It is also possible that she  has an underlying autoimmune rheumatologic disease that is causing anemia,  for example lupus.  The patient will be followed up as an outpatient and  over the course of her hospital stay, her hemoglobin actually improved.  The  patient did not need a blood transfusion and was asymptomatic throughout the  entire time.   Pain management.  The patient's pain was well managed throughout her entire  hospital stay.  In fact, the wounds of the hydradenitis were less painful  than the erythema nodosum lesions.  The patient was controlled with MS  Contin 50 mg p.o. b.i.d. along with Lortab 1-2 tablets q. 4-6 hours p.r.n.  pain.  Because she responded well to this regimen, we discharged her home  with the MS Contin and the Lortab medications without any refills and a  limited supply of these pills.  She will followup in 1-10 days with Dr.  Raquel James.  Dr. Raquel James can decide if she needs to refill any of these pain medications.  She will also followup with a  plastic surgeon, Dr. Shon Hough  on Wednesday, August 09, 2006.  He may have further recommendations for pain  management.  Overall, the patient's pain was well controlled and on  discharge day she felt improved compared to when she came in.   Malnutrition.  This patient was noted to have a very low albumin level.  Some of this was related to the open wounds, however the patient on  interviewing was noted that she does not have a very good understanding of  adequate nutrition.  We consulted nutrition/dieticians in house.  They came  by and had several recommendations including a drink called Genuva to help  with protein.  The patient was instructed to drink milk and eats lots of  meat to help with her protein.  Several other dietary recommendations and  education was provided to the patient and she was given samples of Genuva.   Overall, this patient was much improved and left in stable condition on  August 06, 2006.  Multiple labs are pending including ANA, a G6TB test and a  total complement test are still pending.  These labs will be followed as an  outpatient and Dr. Raquel James or Dr. Rosann Auerbach will review these with the patient.  If they are abnormal the patient will followup as previously discussed with  Dr. Raquel James and Dr. Shon Hough, the plastic surgeon to go over the future  medical care for hydradenitis.   IMAGES:  He had a Doppler of the lower extremities performed for this  patient when she presented.  It was initially unable to differentiate  whether the pain she was having was calf pain or anterior leg pain.  Dopplers were negative for DVT, Bakers cyst and/or superficial vein  thrombosis.  That was performed on August 03, 2006.  No other imaging needed  during this time during this hospital stay.   IMPORTANT LAB VALUES:  Initially this patient on day of admission had a  normal white count of 10.2, hemoglobin 9.3, hematocrit 27.4, platelets 433.  Please note that the hemoglobin  was low at 9.3 and the platelets were high  at 433.  Her initial comprehensive metabolic panel was normal except for an  elevated alkaline phosphatase of 127.  Liver enzymes AST and ALT were  normal.  The patient did have an elevated D-dimer on initial admission date  of 0.66.  Over the course of the hospital as mentioned previously we were  concerned about anemia.  We checked a ferritin level which was normal at  222.  We checked a reticulocyte count which was normal at 1.5.  However we  normally would expect it to be higher given the patient did have anemia and  did not appear she is making adequate red blood cells at this point.  As  mentioned previously, we will work this up further.  The patient's white  blood cell count remained relatively stable throughout her hospital stay.  However on August 05, 2006 it was mildly elevated at 11.9, however the  patient remained afebrile at this time.  Also over the course of hospital stay, we checked an ESR which was elevated at 121.  C reactive protein which  is elevated at 7.8.  These are acute phase reactants however they can be  seen with an autoimmune underlying rheumatologic diseases which is why we  did further tests.  The patient's albumin was very low at 10.7.  LDH was low  normal at 91.  Peripheral blood smear was within normal limits.  Heptoglobin  was high at 526.  Fecal occult blood negative.  ASO titer normal at 43.  C4  complement normal at 27.  Basic metabolic panel on day of discharge was  completely normal with the exception of albumin which was low at 2.9 and her  alkaline phosphatase was back to being normal.  A urinalysis on  August 05, 2006 was positive for large amount of red blood cells.  Negative  for protein.  Small amount of leukocytes.  Negative for nitrates.  Cultures  are pending.  Blood cultures so far are only positive for diphtheria.  No  MRSA.           ______________________________  Johney Maine,  M.D.     JT/MEDQ  D:  08/06/2006  T:  08/06/2006  Job:  [id number]

## 2011-05-06 NOTE — Op Note (Signed)
NAME:  Rachel Mckinney, Rachel Mckinney               ACCOUNT NO.:  [id number]   MEDICAL RECORD NO.:  [id number]          PATIENT TYPE:  AMB   LOCATION:  DSC                          FACILITY:  [facility]   PHYSICIAN:  Earvin Hansen L. Truesdale, M.D.DATE OF BIRTH:  1976/03/27   DATE OF PROCEDURE:  DATE OF DISCHARGE:                                 OPERATIVE REPORT   A 35 year old lady has history of severe hidradenitis suppurativa involving  the groin areas, right and left inguinal, perineal areas, across the lower  abdomen and pubic area.  She has had multiple I&Ds of the areas with  drainage and cultures, antibiotics, increased pain on each of these  adventures.  She now has increased hidradenitis on the right and left  groins, perineal area, almost the perianal area on the left side.  Has  increased hidradenitis on the abdomen and pubic area.   OPERATION/PROCEDURE:  1.  Wide excision of each area including the whole groin and over the      buttock areas with plastic reconstruction as well as the area over the      lower abdomen, pubic hair area.  Each of the large areas measure over 12      inches in length, approximately two inches in width.  2.  Wide radical excision, plastic reconstruction of the areas with flap      advancement.   ANESTHESIA:  General.   CONSENT:  Preoperatively the patient underwent total discussion of the  procedure as well as the risks, possible complications including scarring,  infection hematoma formation.  Even with this the patient agreed to surgery  understanding the potential complications thoroughly.   DESCRIPTION OF PROCEDURE:  She underwent general anesthesia, intubated  orally. Preparation was done to the abdomen, right and left groins, perineal  areas, anal area, and thighs using Betadine soap and solution along with  sterile towels and drapes to make a sterile field.  Foley catheter was  placed sterilely.  Xylocaine 0.25% with epinephrine was injected locally  after  the areas were totally drawn out with a marking pen.  Using a Bovie  cutting, I was able to excise all of the areas down to the superficial  fascia.  Each one removed separately down to superficial fascia.  Hemostasis  was maintained with the Bovie unit on coagulation.  Irrigation was done.  The fascia was then freed up and advanced back together using 2-0 Monocryl  x2 layers and then a running subcuticular stitch of 3-0 Monocryl and a 5-0  Monocryl and a running 3-0 nylon outside for security.  Same was done on the  right and left sides.  Same was done at the pubic hair area separately in an  area measuring approximately 2 x 4 cm and an area over the lower abdomen  measuring approximately the same. They were closed likewise.   The wounds were cleansed.  Sterile dressings were applied including  Xeroform, 4 x 4's, ABDs, Hypafix tape and a diaper-type cover dressing.  She  tolerated all the procedures very well.  Estimated blood loss was less than  150 mL.  No complications.  She will be admitted overnight stay for  examination and treatment.     Yaakov Guthrie. Shon Hough, M.D.  Electronically Signed    GLT/MEDQ  D:  07/10/2006  T:  07/10/2006  Job:  [id number]

## 2011-05-06 NOTE — Op Note (Signed)
NAME:  Rachel Mckinney, Rachel Mckinney                         ACCOUNT NO.:  [id number]   MEDICAL RECORD NO.:  [id number]                   PATIENT TYPE:  AMB   LOCATION:  DSC                                  FACILITY:  [facility]   PHYSICIAN:  Cindee Salt, M.D.                    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   OPERATIONS:  Excision of cyst, subcutaneous, to carpal boss, debridement of  carpal boss, and excision of interosseous cyst, left wrist.   SURGEON:  Cindee Salt, M.D.   ASSISTANTCarolyne Fiscal, R.N.   ANESTHESIA:  IV regional.   HISTORY:  The patient is a 35 year old female with a history of a mass in  the dorsal aspect of her left wrist.  X-rays revealed a carpal boss present.   DESCRIPTION OF PROCEDURE:  The patient was brought to the operating room  where an IV regional anesthetic was carried out without difficulty.  She was  prepped and draped using Duraprep in the supine position with the left arm  free.  A transverse incision was made directly over the mass and carried  down through subcutaneous tissue.  The dorsal radial nerves were identified  and protected.  The retractor was placed.  The dissection was carried down  to a cystic mass.  This was found to arise from the carpometacarpal joint of  the middle finger.  This was excised and sent to pathology.  The capsule was  opened.  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  This was  rongeured down to normal cartilage.  A cyst was then apparent in the base of  the metacarpal in the middle finger.  With a rongeur this was removed and  sent separately to pathology.  The wound was irrigated.  The periosteum was  then closed with figure-of-eight 4-0 Vicryl sutures, the subcutaneous tissue  with  interrupted 4-0 Vicryl, and the skin with a subcuticular 4-0 Monocryl  suture.  Steri-Strips were applied.  A sterile compressive dressing and  splint were applied.  The patient tolerated the procedure well and was taken  to the recovery room for observation in satisfactory condition.  She is  discharged home to return to the [facility] [place] in one week on  Vicodin and Keflex.                                               Cindee Salt, M.D.    GK/MEDQ  D:  04/03/2003  T:  04/03/2003  Job:  [id number]

## 2011-05-06 NOTE — Op Note (Signed)
[place]  Patient:    Rachel Mckinney, Rachel Mckinney                      MRN: [id number] Proc. Date: 02/14/00 Adm. Date:  [id number] Attending:  Fortino Sic                           Operative Report  PREOPERATIVE DIAGNOSIS:  Extensive hidradenitis suppurativa, both breasts and left axilla.  POSTOPERATIVE DIAGNOSIS:  Extensive hidradenitis suppurativa, both breasts and eft axilla.  OPERATION PERFORMED:  Excision hidradenitis.  SURGEON:  Marnee Spring. Wiliam Ke, M.D.  ASSISTANT:  None.  ANESTHESIA:  Endotracheal by hospital.  DESCRIPTION OF PROCEDURE:  Under good endotracheal anesthesia, the skin of both  breasts and left axilla was prepped and draped in the usual manner.  The right breast and the left breast and the left axilla were done in that order.  They were all done essentially the same way.  All of the abnormal skin and subcutaneous tissue was excised as a large ellipse.  Hemostasis was obtained with electrocautery current.  The wounds were closed with some subcutaneous 3-0 Vicryl, a few subcuticular 4-0 Dexon and then with stainless steel staples.  The right breast  wound was probably 10 cm.  The left breast wound, approximately 8 cm.  The axillary wound approximately 8 cm.  Hemostasis was good.  Dressings were applied. Estimated blood loss for this procedure was 50 cc.  The patient received no blood and left the operating room in stable condition after sponge and needle counts were verified. DD:  02/14/00 TD:  02/14/00 Job: [id number]

## 2011-06-13 ENCOUNTER — Emergency Department (HOSPITAL_COMMUNITY)
Admission: EM | Admit: 2011-06-13 | Discharge: 2011-06-13 | Discharge status: Against Medical Advice | Payer: 59 | Attending: Emergency Medicine | Admitting: Emergency Medicine

## 2011-06-13 ENCOUNTER — Inpatient Hospital Stay (INDEPENDENT_AMBULATORY_CARE_PROVIDER_SITE_OTHER)
Admission: RE | Admit: 2011-06-13 | Discharge: 2011-06-13 | Disposition: A | Discharge status: Home / Self Care | Payer: 59 | Source: Ambulatory Visit | Attending: Family Medicine | Admitting: Family Medicine

## 2011-06-13 ENCOUNTER — Ambulatory Visit (INDEPENDENT_AMBULATORY_CARE_PROVIDER_SITE_OTHER): Payer: 59 | Admitting: Family Medicine

## 2011-06-13 VITALS — BP 127/81 | HR 88 | Temp 99.0°F | Ht 64.0 in | Wt 253.4 lb

## 2011-06-13 DIAGNOSIS — Z0389 Encounter for observation for other suspected diseases and conditions ruled out: Secondary | ICD-10-CM | POA: Insufficient documentation

## 2011-06-13 DIAGNOSIS — L0291 Cutaneous abscess, unspecified: Secondary | ICD-10-CM

## 2011-06-13 DIAGNOSIS — L03319 Cellulitis of trunk, unspecified: Secondary | ICD-10-CM

## 2011-06-13 DIAGNOSIS — L02219 Cutaneous abscess of trunk, unspecified: Secondary | ICD-10-CM

## 2011-06-13 DIAGNOSIS — R7309 Other abnormal glucose: Secondary | ICD-10-CM

## 2011-06-13 DIAGNOSIS — R739 Hyperglycemia, unspecified: Secondary | ICD-10-CM

## 2011-06-13 DIAGNOSIS — L02211 Cutaneous abscess of abdominal wall: Secondary | ICD-10-CM

## 2011-06-13 LAB — POCT GLYCOSYLATED HEMOGLOBIN (HGB A1C): Hemoglobin A1C: 5.5

## 2011-06-13 MED ORDER — HYDROCODONE-ACETAMINOPHEN 5-500 MG PO TABS: 1.0000 | ORAL_TABLET | ORAL | Status: DC | PRN

## 2011-06-13 MED ORDER — SULFAMETHOXAZOLE-TRIMETHOPRIM 800-160 MG PO TABS: 2.0000 | ORAL_TABLET | Freq: Two times a day (BID) | ORAL | Status: DC

## 2011-06-13 NOTE — Patient Instructions (Signed)
Incision and Drainage of Abscess (Furuncle, Boil) An abscess (boil or furuncle) is an area infected by germs that contains a collection of pus. Signs and problems (symptoms) of an abscess include pain, tenderness, redness, or hardness. You may feel a moveable, soft area under your skin. An abscess can occur anywhere in the body. Occasionally, this may spread to surrounding tissues causing cellulitis. Sometimes, a surgeon may make a cut (incision) over your abscess. The pus is drained. Gauze may be packed into the space to provide a drain. Keeping a drain or piece of gauze in the incision keeps the skin from healing first. This helps stop the abscess from forming again. The area may be painful for 5 to 7 days. Most people with an abscess do not have high fevers. If seen early, your abscess may not have localized and may not be cut. If it does not get better on its own or with medicines, you may require another appointment. HOME CARE INSTRUCTIONS  Only take over-the-counter or prescription medicines for pain, discomfort, or fever as directed by your caregiver. Use these only if your caregiver has not given medicines that would interfere.   When you bathe, remove the gauze drain after soaking. You may then wash the wound gently with mild, soapy water. Repack with gauze as your caregiver directs.   See your caregiver in 3-5 days for a recheck.   If antibiotics were prescribed, take them as directed.  SEEK MEDICAL CARE IF:  You develop increased pain, swelling, redness, drainage, or bleeding in the wound site.   You develop signs of generalized infection, including muscle aches, chills, or a general ill feeling.   You or your child has an oral temperature above 101.  MAKE SURE YOU:  Understand these instructions.   Will watch your condition.   Will get help right away if you are not doing well or get worse.  Document Released: 05/31/2001 Document Re-Released: 03/01/2010 [employer] Patient  Information 2011 [employer]. 

## 2011-06-14 DIAGNOSIS — L02211 Cutaneous abscess of abdominal wall: Secondary | ICD-10-CM | POA: Insufficient documentation

## 2011-06-14 NOTE — Assessment & Plan Note (Addendum)
Informed consent obtained and signed copy in chart. Appropriate time out taken. Area prepped and draped in usual sterile fashion, iodine x 3 with alcohol swab to clean away iodine. Local anaesthesia with 2% lidocaine with epi (8 cc). Small 1.5 cm incision made horizontally across center of area of fluctuance.  Purulent material expressed immediately and wiped clean with sterile gauze.  Culture sent.  Sterile Hemostat used to break adhesions within abscess.  Blunt end of hemostats used to probe to base of wound, about 1 cm deep.  After purulent material expressed, sterile saline used to irrigate with 20 cc syringe.  Wound packed with iodoform packing and sterile gauze applied. No complications. EBL < 5 cc.  She is to return for wound check in 2-3 days for packing removal and possible replacement.  At that time may need to be instructed on removal of small amount of packing each day if packing is replaced.  Likely MRSA infection with overlying cellulitis, 10 day course of BActrim prescribed along with hydrocodone for pain relief.

## 2011-06-14 NOTE — Progress Notes (Signed)
  Subjective:    Patient ID: Rachel Mckinney, female    DOB: [birth year], 35 y.o.   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  Went to Urgent Care today, told it was too big to drain, sent to ED.  ED wait was too long so she came here as work-in.  Has much pain and redness on abdomen.  Did not want to come in because I&D is painful but she could not take pain any longer.  No nausea, vomiting, fevers, chills.     Review of Systems See HPI above for review of systems.       Objective:   Physical Exam Gen:  Alert, cooperative patient who appears stated age in no acute distress.  Vital signs reviewed.   Skin:  4 x 5 cm area of redness with some mild induration present on Left side of abdomen about 5 cm lateral from umbilicus.  1 x 1.5 cm area of fluctuance noted at point of maximal tenderness.  Spontaneous drainage noted with only minimal pressure.  No other lesions noted elsewhere on body.           Assessment & Plan:

## 2011-06-15 ENCOUNTER — Ambulatory Visit: Payer: 59 | Admitting: Family Medicine

## 2011-06-15 ENCOUNTER — Ambulatory Visit (INDEPENDENT_AMBULATORY_CARE_PROVIDER_SITE_OTHER): Payer: 59 | Admitting: Family Medicine

## 2011-06-15 VITALS — BP 138/84 | HR 81 | Temp 98.6°F | Ht 64.5 in | Wt 253.0 lb

## 2011-06-15 DIAGNOSIS — L0390 Cellulitis, unspecified: Secondary | ICD-10-CM

## 2011-06-15 DIAGNOSIS — L02211 Cutaneous abscess of abdominal wall: Secondary | ICD-10-CM

## 2011-06-15 DIAGNOSIS — L02219 Cutaneous abscess of trunk, unspecified: Secondary | ICD-10-CM

## 2011-06-15 DIAGNOSIS — L0291 Cutaneous abscess, unspecified: Secondary | ICD-10-CM

## 2011-06-15 MED ORDER — SULFAMETHOXAZOLE-TRIMETHOPRIM 800-160 MG PO TABS: 2.0000 | ORAL_TABLET | Freq: Two times a day (BID) | ORAL | Status: DC

## 2011-06-15 NOTE — Patient Instructions (Signed)
Incision and Drainage of Abscess (Furuncle, Boil) An abscess (boil or furuncle) is an area infected by germs that contains a collection of pus. Signs and problems (symptoms) of an abscess include pain, tenderness, redness, or hardness. You may feel a moveable, soft area under your skin. An abscess can occur anywhere in the body. Occasionally, this may spread to surrounding tissues causing cellulitis. Sometimes, a surgeon may make a cut (incision) over your abscess. The pus is drained. Gauze may be packed into the space to provide a drain. Keeping a drain or piece of gauze in the incision keeps the skin from healing first. This helps stop the abscess from forming again. The area may be painful for 5 to 7 days. Most people with an abscess do not have high fevers. If seen early, your abscess may not have localized and may not be cut. If it does not get better on its own or with medicines, you may require another appointment. HOME CARE INSTRUCTIONS  Only take over-the-counter or prescription medicines for pain, discomfort, or fever as directed by your caregiver. Use these only if your caregiver has not given medicines that would interfere.   When you bathe, remove the gauze drain after soaking. You may then wash the wound gently with mild, soapy water. Repack with gauze as your caregiver directs.   See your caregiver in 3-5 days for a recheck.   If antibiotics were prescribed, take them as directed.  SEEK MEDICAL CARE IF:  You develop increased pain, swelling, redness, drainage, or bleeding in the wound site.   You develop signs of generalized infection, including muscle aches, chills, or a general ill feeling.   You or your child has an oral temperature above 101.  MAKE SURE YOU:  Understand these instructions.   Will watch your condition.   Will get help right away if you are not doing well or get worse.  Document Released: 05/31/2001 Document Re-Released: 03/01/2010 [facility] Patient  Information 2011 [place], [place].

## 2011-06-16 ENCOUNTER — Encounter: Payer: Self-pay | Admitting: Family Medicine

## 2011-06-16 LAB — WOUND CULTURE

## 2011-06-16 NOTE — Assessment & Plan Note (Signed)
No red flags today. Gave patient hard copy of Bactrim Rx. Follow up next week to re-eval if not improving.

## 2011-06-16 NOTE — Progress Notes (Signed)
  Subjective:    Patient ID: Rachel Mckinney, female    DOB: 23-Feb-1976, 35 y.o.   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  I&D by Dr. Gwendolyn Grant with Rx Bactrim, but Rx did not get to pharmacy. No nausea, vomiting, fevers, chills.    Review of Systems  See HPI above for review of systems.       Objective:   Physical Exam  Gen:  Alert, cooperative patient who appears stated age in no acute distress.  Vital signs reviewed.   Skin:  4 x 5 cm area of redness with some mild induration present on Left side of abdomen about 5 cm lateral from umbilicus.  S/P I&D with some serous drainage. No other lesions noted elsewhere on body.  Wick fell out.    Assessment & Plan:

## 2011-06-20 ENCOUNTER — Other Ambulatory Visit (HOSPITAL_COMMUNITY)
Admission: RE | Admit: 2011-06-20 | Discharge: 2011-06-20 | Disposition: A | Discharge status: Home / Self Care | Payer: 59 | Source: Ambulatory Visit | Attending: Family Medicine | Admitting: Family Medicine

## 2011-06-20 ENCOUNTER — Ambulatory Visit (INDEPENDENT_AMBULATORY_CARE_PROVIDER_SITE_OTHER): Payer: 59 | Admitting: Family Medicine

## 2011-06-20 VITALS — BP 133/94 | HR 87 | Temp 98.8°F

## 2011-06-20 DIAGNOSIS — Z124 Encounter for screening for malignant neoplasm of cervix: Secondary | ICD-10-CM

## 2011-06-20 DIAGNOSIS — L732 Hidradenitis suppurativa: Secondary | ICD-10-CM

## 2011-06-20 DIAGNOSIS — Z309 Encounter for contraceptive management, unspecified: Secondary | ICD-10-CM

## 2011-06-20 DIAGNOSIS — Z01419 Encounter for gynecological examination (general) (routine) without abnormal findings: Secondary | ICD-10-CM | POA: Insufficient documentation

## 2011-06-20 DIAGNOSIS — N760 Acute vaginitis: Secondary | ICD-10-CM

## 2011-06-20 LAB — POCT WET PREP (WET MOUNT): Clue Cells Wet Prep HPF POC: NEGATIVE

## 2011-06-20 MED ORDER — MEDROXYPROGESTERONE ACETATE 150 MG/ML IM SUSP
150.0000 mg | Freq: Once | INTRAMUSCULAR | Status: AC
  Administered 2011-06-20: 150 mg via INTRAMUSCULAR

## 2011-06-20 MED ORDER — CLINDAMYCIN PHOSPHATE 1 % EX GEL: CUTANEOUS | Status: DC

## 2011-06-20 NOTE — Progress Notes (Signed)
  Subjective:    Patient ID: Rachel Mckinney, female    DOB: 11/06/1976, 35 y.o.   MRN: [id number]  HPI 1. Screening for Cervical Cancer Performed the PAP, GC/Chl, wet prep - at patients request, no evidence or symptoms of VD. No mass or pain on bimanual.  2. Hidradenitis suppurativa Patient has an extensive history of surgeries under her axilla for recurrent flare ups. She currently has no fever but has two small abscesses, one on her abdomen that has been drained and is healing, the other on her labia, which does not appear inflamed and is without fluctuation. No drainage required at this time. She has done well with Bactrim, which reduces the length of healing and extent of infection.   Review of Systems  Constitutional: Negative for fever.  Gastrointestinal: Negative for abdominal pain.  Genitourinary: Negative for vaginal bleeding, vaginal discharge and vaginal pain.  Skin: Positive for wound. Negative for rash.  Hematological: Negative for adenopathy.       Objective:   Physical Exam  Constitutional: Vital signs are normal. She appears well-developed and well-nourished.       obese  Abdominal: Soft. She exhibits no distension. There is no tenderness.         Healing wound from I+D of abscess  Genitourinary: Vagina normal and uterus normal. Cervix exhibits no motion tenderness, no discharge and no friability. No erythema, tenderness or bleeding around the vagina. No vaginal discharge found.       Small 1 cm x 1cm left labial abscess  Lymphadenopathy:       Right: No inguinal adenopathy present.       Left: No inguinal adenopathy present.        Assessment & Plan:  1. Screening for Cervical Cancer Performed the PAP, GC/Chl, wet prep   2. Hidradenitis suppurativa - frequent recurrance of abscess Clindamycin 1% lotion BID OTC anti MRSA soap

## 2011-06-21 ENCOUNTER — Telehealth: Payer: Self-pay | Admitting: Family Medicine

## 2011-06-21 ENCOUNTER — Encounter: Payer: Self-pay | Admitting: Family Medicine

## 2011-06-21 LAB — GC/CHLAMYDIA PROBE AMP, GENITAL: GC Probe Amp, Genital: NEGATIVE

## 2011-06-21 NOTE — Telephone Encounter (Signed)
Patient dropped off FMLA forms to be filled out.  Please call her when completed. °

## 2011-06-21 NOTE — Telephone Encounter (Signed)
Paperwork place din Dr. Rolene Arbour box.  Patient was just seen by him yesterday.

## 2011-06-23 ENCOUNTER — Telehealth: Payer: Self-pay | Admitting: Family Medicine

## 2011-06-23 NOTE — Telephone Encounter (Signed)
I have Mrs. Boxley paperwork. I cannot fill it out without a reason for LOA from work. I directed her via voice-mail to call the clinic and leave me a message stating the reason she needs to be out of work.

## 2011-06-23 NOTE — Telephone Encounter (Signed)
Request is related to her [employer].  Discussed with Dr. Rivka Safer.  This does not qualify for FMLA.  Dr. Rivka Safer will discuss with patient.  Paperwork returned to me.

## 2011-06-23 NOTE — Telephone Encounter (Signed)
Called patient and left message for her to explain the reason for FMLA. I have her forms, but don't know the reason for LOA.

## 2011-08-10 ENCOUNTER — Encounter (INDEPENDENT_AMBULATORY_CARE_PROVIDER_SITE_OTHER): Payer: Self-pay | Admitting: General Surgery

## 2011-08-10 ENCOUNTER — Ambulatory Visit (INDEPENDENT_AMBULATORY_CARE_PROVIDER_SITE_OTHER): Payer: Commercial Managed Care - PPO | Admitting: General Surgery

## 2011-08-10 VITALS — BP 156/98 | HR 70 | Temp 98.0°F | Ht 65.0 in | Wt 248.4 lb

## 2011-08-10 DIAGNOSIS — N61 Mastitis without abscess: Secondary | ICD-10-CM

## 2011-08-10 DIAGNOSIS — L732 Hidradenitis suppurativa: Secondary | ICD-10-CM

## 2011-08-10 DIAGNOSIS — N611 Abscess of the breast and nipple: Secondary | ICD-10-CM | POA: Insufficient documentation

## 2011-08-10 MED ORDER — DOXYCYCLINE HYCLATE 100 MG PO TABS: 100.0000 mg | ORAL_TABLET | Freq: Two times a day (BID) | ORAL | Status: AC

## 2011-08-10 NOTE — Progress Notes (Signed)
Subjective:     Patient ID: Rachel Mckinney, female   DOB: 22-Sep-1976, 35 y.o.   MRN: [id number]  HPI This is a 36 year old female with a long history of hidradenitis as well as multiple abscess he is to appear in the associated with MRSA. She comes in with a recent several day history of a left breast skin lesion that has been draining somewhat as well as an area in her perineum that is she notes a lump as well as some skin irritation. She denies any fevers or any other symptoms associated with these.  Review of Systems     Objective:   Physical Exam Left breast with skin lesion c/w likely mrsa abscess, minimal drainage, no real area to drain Perineum with chronic changes but no active infection    Assessment:     Multiple skin lesions, old and new, c/w hidradenitis or mrsa infection    Plan:     No indication for drainage right now. Will place on doxycycline for 2 weeks.  Return if no better or continued problems.  She needs to quit smoking.

## 2011-08-18 ENCOUNTER — Encounter (INDEPENDENT_AMBULATORY_CARE_PROVIDER_SITE_OTHER): Payer: Self-pay | Admitting: General Surgery

## 2011-09-20 ENCOUNTER — Encounter: Payer: Self-pay | Admitting: Family Medicine

## 2011-09-20 ENCOUNTER — Ambulatory Visit (INDEPENDENT_AMBULATORY_CARE_PROVIDER_SITE_OTHER): Payer: 59 | Admitting: Family Medicine

## 2011-09-20 VITALS — BP 147/97 | HR 99 | Temp 99.2°F | Ht 65.0 in | Wt 244.5 lb

## 2011-09-20 DIAGNOSIS — N760 Acute vaginitis: Secondary | ICD-10-CM | POA: Insufficient documentation

## 2011-09-20 DIAGNOSIS — L0390 Cellulitis, unspecified: Secondary | ICD-10-CM

## 2011-09-20 DIAGNOSIS — L0293 Carbuncle, unspecified: Secondary | ICD-10-CM

## 2011-09-20 DIAGNOSIS — J029 Acute pharyngitis, unspecified: Secondary | ICD-10-CM

## 2011-09-20 DIAGNOSIS — L0291 Cutaneous abscess, unspecified: Secondary | ICD-10-CM

## 2011-09-20 DIAGNOSIS — L0292 Furuncle, unspecified: Secondary | ICD-10-CM

## 2011-09-20 LAB — POCT WET PREP (WET MOUNT): Trichomonas Wet Prep HPF POC: NEGATIVE

## 2011-09-20 LAB — POCT RAPID STREP A (OFFICE): Rapid Strep A Screen: NEGATIVE

## 2011-09-20 MED ORDER — SULFAMETHOXAZOLE-TRIMETHOPRIM 800-160 MG PO TABS: 2.0000 | ORAL_TABLET | Freq: Two times a day (BID) | ORAL | Status: DC

## 2011-09-20 MED ORDER — NYSTATIN 100000 UNIT/GM EX POWD: CUTANEOUS | Status: DC

## 2011-09-20 NOTE — Assessment & Plan Note (Signed)
These appear to be herpetic lesions on the right side of her labia. Left-sided labia does have abscess as noted. Obtained viral swab; however as these have been present for about 14 days I'm unsure if will get anything back on her viral swab. Patient states she's never had this before and we discussed that usual course of herpes. Also explained that usually is the primary outbreak that causes the most discomfort. Did discuss recurrence and possible medication use. Patient states she believes is probably not herpes she's never had this before in the past. I will have her follow up will further discuss this at followup visit in 2 weeks. We'll have viral culture at that time.

## 2011-09-20 NOTE — Progress Notes (Signed)
  Subjective:    Patient ID: Rachel Mckinney, female    DOB: Nov 15, 1976, 35 y.o.   MRN: [id number]  HPI 1.  ABscess:  Patient has concern for 3 new abscesses. One her left breast, one left side, one left labia. States these all came upper on the same period of time in the past 2-3 weeks. Has not had any antibiotics since being seen by Dr. Daphine Deutscher of [facility] surgery in August. Has not tried to drain these on her but feels that the one on her breast and her left side has "popped" and has started draining on its own. No fevers, chills, abdominal pain, nausea vomiting.  2.  Labial discomfort:  Has had some burning sensation in her genital area for past two weeks. Wonders if this is from the abscess on her left labia. Is concerned because she feels it on her right side as well. Last sexual contact was over a month ago and she states she used protection. Has not had any increased vaginal discharge but does have a concern for vaginal odor.  3.  Red bumps:  Has noticed some red bumps scattered across her trunk, stomach, legs, under her breasts. She has been using a new powder under her breasts and noticed red bumps that appeared after then. She says that itch sometimes.   Review of Systems See HPI above for review of systems.       Objective:   Physical Exam Gen:  Alert, cooperative patient who appears stated age in no acute distress.  Vital signs reviewed.  Obese Cardiac:  Regular rate and rhythm without murmur auscultated.  Good S1/S2. Pulm:  Clear to auscultation bilaterally with good air movement.  No wheezes or rales noted.   Skin:  0.5 cm abscess with induration and no clear area of fluctuance noted left mid axillary line on her abdomen.  1 cm abscess also without clear area of fluctuance and minimal induration noted under left breast. Scattered pinpoint papules across the trunk under bilateral breasts and on her thighs. No areas of excoriation noted. GYN:  External genitalia revealed left  labia with 1 cm abscess. Mild induration noted but no areas of fluctuance. Right labia with 3 lesions that appear to be herpetic in nature. Some mild discharge noted all 3 lesions are about 3-4 mm in size 2 on the inner portion of her right labia and one on the outside. These were swabbed with a viral culture.  Due to the pain from both right-sided lesions and left-sided abscess unable to speculum. Did do blind swab wet prep.       Assessment & Plan:

## 2011-09-20 NOTE — Patient Instructions (Addendum)
I will let you know what the various swabs showed. Make sure to keep the area under your breasts dry. I will send in 10 days of the antibiotic.  It should help under your breast, your genital area, and your side. Come back and see me after the antibiotics if its no better.   It was good to see you today.  Watch for any changes in your skin or in your mouth being on the Bactrim like we talked about.

## 2011-09-20 NOTE — Assessment & Plan Note (Signed)
These have returned. No clear area of fluctuance ready for drainage today. Plan start her on high-dose Bactrim which has helped her in the past. She is to followup after finishing her Bactrim course. The patient was warned about possible side effects of Bactrim and to return to clinic or go to the ER immediately if she notices any sloughing of skin, dry skin, changes in her skin or mucous membranes. She is to followup sooner if she feels that these areas that started to become fluctuant, drain, or just are not getting better on antibiotics.

## 2011-09-26 ENCOUNTER — Telehealth: Payer: Self-pay | Admitting: Family Medicine

## 2011-09-26 NOTE — Telephone Encounter (Signed)
Called to discuss lab results and positive herpes viral culture.  Had to leave message. Will review results with her when she calls back.

## 2011-10-04 ENCOUNTER — Ambulatory Visit (INDEPENDENT_AMBULATORY_CARE_PROVIDER_SITE_OTHER): Payer: 59 | Admitting: Family Medicine

## 2011-10-04 ENCOUNTER — Ambulatory Visit: Payer: 59 | Admitting: Family Medicine

## 2011-10-04 DIAGNOSIS — L0292 Furuncle, unspecified: Secondary | ICD-10-CM

## 2011-10-04 DIAGNOSIS — B86 Scabies: Secondary | ICD-10-CM

## 2011-10-04 DIAGNOSIS — L0293 Carbuncle, unspecified: Secondary | ICD-10-CM

## 2011-10-04 MED ORDER — PERMETHRIN 5 % EX CREA: TOPICAL_CREAM | CUTANEOUS | Status: DC

## 2011-10-05 NOTE — Assessment & Plan Note (Signed)
Resolving well. Discussed red flags and reasons to return to clinic.

## 2011-10-05 NOTE — Assessment & Plan Note (Signed)
Permethrin to treat

## 2011-10-05 NOTE — Progress Notes (Signed)
  Subjective:    Patient ID: Rachel Mckinney, female    DOB: 1976/08/29, 35 y.o.   MRN: [id number]  HPI 1.  Red bumps:  Persist. Patient states that now spreading. Become more itchy. She's been using Benadryl which caused itching for a couple hours but then it comes back. Her son is now starting to have very similar symptoms and red bumps. No other contacts. No fevers or chills. No recent illnesses.  2.  Recurrent boils:  Much improved. Has been taking Bactrim as prescribed. All her boils almost totally resolved. No fevers or chills. No abdominal pain, nausea, vomiting.   Review of Systems See HPI above for review of systems.       Objective:   Physical Exam Gen:  Alert, cooperative patient who appears stated age in no acute distress.  Vital signs reviewed. Skin:  Multiple pinpoint papules scattered left labia. abdomen chest bilateral arms and thighs. Excoriations noted as well. Some burrows present. Previous abscess sites healing well. Only minimal erythema and scab noted. 3 abscess sites were left mid axillary line on her trunk, under left breast, left labia. Did not examine breast or labia due to patient preference.       Assessment & Plan:

## 2011-10-07 ENCOUNTER — Other Ambulatory Visit: Payer: Self-pay | Admitting: Family Medicine

## 2011-10-07 DIAGNOSIS — R21 Rash and other nonspecific skin eruption: Secondary | ICD-10-CM

## 2011-10-07 MED ORDER — CLOTRIMAZOLE-BETAMETHASONE 1-0.05 % EX CREA: TOPICAL_CREAM | CUTANEOUS | Status: DC

## 2011-11-15 ENCOUNTER — Encounter: Payer: Self-pay | Admitting: Family Medicine

## 2011-11-15 ENCOUNTER — Ambulatory Visit (INDEPENDENT_AMBULATORY_CARE_PROVIDER_SITE_OTHER): Payer: 59 | Admitting: Family Medicine

## 2011-11-15 DIAGNOSIS — L0292 Furuncle, unspecified: Secondary | ICD-10-CM

## 2011-11-15 DIAGNOSIS — F3289 Other specified depressive episodes: Secondary | ICD-10-CM

## 2011-11-15 DIAGNOSIS — L0291 Cutaneous abscess, unspecified: Secondary | ICD-10-CM

## 2011-11-15 DIAGNOSIS — F32A Depression, unspecified: Secondary | ICD-10-CM

## 2011-11-15 DIAGNOSIS — F17200 Nicotine dependence, unspecified, uncomplicated: Secondary | ICD-10-CM

## 2011-11-15 DIAGNOSIS — L0293 Carbuncle, unspecified: Secondary | ICD-10-CM

## 2011-11-15 DIAGNOSIS — R21 Rash and other nonspecific skin eruption: Secondary | ICD-10-CM

## 2011-11-15 DIAGNOSIS — F329 Major depressive disorder, single episode, unspecified: Secondary | ICD-10-CM

## 2011-11-15 MED ORDER — SULFAMETHOXAZOLE-TRIMETHOPRIM 800-160 MG PO TABS: 2.0000 | ORAL_TABLET | Freq: Two times a day (BID) | ORAL | Status: DC

## 2011-11-15 NOTE — Patient Instructions (Signed)
I'm going to look at [facility] for you. I will send in referral for Derm and Surgery. I have sent in the antibiotics.

## 2011-11-16 ENCOUNTER — Encounter: Payer: Self-pay | Admitting: Family Medicine

## 2011-11-16 DIAGNOSIS — F32A Depression, unspecified: Secondary | ICD-10-CM | POA: Insufficient documentation

## 2011-11-16 DIAGNOSIS — F329 Major depressive disorder, single episode, unspecified: Secondary | ICD-10-CM | POA: Insufficient documentation

## 2011-11-16 DIAGNOSIS — R21 Rash and other nonspecific skin eruption: Secondary | ICD-10-CM | POA: Insufficient documentation

## 2011-11-16 NOTE — Assessment & Plan Note (Signed)
Plan to refer for bariatric. Patient states she has tried multiple eating and exercise programs. She is ready for something "more." Feel that she she is motivated.

## 2011-11-16 NOTE — Progress Notes (Signed)
  Subjective:    Patient ID: Rachel Mckinney, female    DOB: 1976-03-25, 35 y.o.   MRN: [id number]  HPI 1.  Boils:  Persists. Patient states that previous abscesses resolved and then returned about 6 days after stopping antibiotics. Currently complaining of abscess located under her left breast, left inguinal area, buttocks. States these are spontaneously draining. Denies any fevers or chills. No abdominal pain. Going frustrated because she's been having this problem for most of her life.  2.  Rash: Rash has continued to spread of her body. Papules have resolved.  However she states the rash is now bigger. She has tried with permethrin and withdraws all/betamethasone cream without relief. She wonders what to do next. States the rash does itch    3.  Depression:  Admits to depression. No other joint medications yet. States that depression is coming from hidradenitis and that this can be treated she believes that depression will resolve. Denies any suicidal or homicidal ideation  I have reviewed the patient's pertinent past medical, surgical, family, and social history, including patient's smoking status.  Have also reviewed prior office notes regarding treatment for prior hidradenitis   Review of Systems See HPI above for review of systems.       Objective:   Physical Exam  Gen:  Alert, cooperative patient who appears stated age in no acute distress.  Vital signs reviewed. Skin: 0.5 cm abscess spontaneously draining without overlying erythema under her left breast. Spontaneously draining abscess also 0.5 cm noted left labia majora. 2 abscesses located gluteal cleft and both are spontaneously drained. One of them is 0.3 cm in size and the other 0.5 cm in size.  Both of these are also spontaneously draining without overlying erythema. Patient does have scattered plaques the largest being about 1 cm in size but mostly to 3 mm in size across the trunk, chest, back. These plaques are scaly. No real  erythema noted. Excoriation noted Psych: Patient does appear depressed. Not anxious appearing Abd:  Soft/nondistended/nontender.  Good bowel sounds throughout all four quadrants.  No masses noted.         Assessment & Plan:

## 2011-11-16 NOTE — Assessment & Plan Note (Signed)
Discussed and counseled.

## 2011-11-16 NOTE — Assessment & Plan Note (Signed)
Plan referral to dermatology. Dermatology is more for her rash but hopeful that they may have some recommendations for hidradenitis as well. Please see rash above. Bactrim double strength as per previous prescriptions

## 2011-11-16 NOTE — Assessment & Plan Note (Signed)
Unclear exactly what rashes. As prior steroid cream and Permethrin has not helped, plan for referral to dermatology.

## 2011-11-17 ENCOUNTER — Telehealth: Payer: Self-pay | Admitting: *Deleted

## 2011-11-17 NOTE — Telephone Encounter (Signed)
LMOVM for pt to callback about appt.  It is with Dr. Terri Piedra for 12.3.12 @ 11:25am.  Please inform when she calls back.. Will also mail a reminder. Aramis Weil, Maryjo [place]

## 2011-11-17 NOTE — Telephone Encounter (Signed)
Pt informed and agreeable. Rachel Mckinney  

## 2011-11-30 ENCOUNTER — Ambulatory Visit (INDEPENDENT_AMBULATORY_CARE_PROVIDER_SITE_OTHER): Payer: 59 | Admitting: Family Medicine

## 2011-11-30 DIAGNOSIS — L0291 Cutaneous abscess, unspecified: Secondary | ICD-10-CM

## 2011-11-30 DIAGNOSIS — L0292 Furuncle, unspecified: Secondary | ICD-10-CM

## 2011-11-30 DIAGNOSIS — L732 Hidradenitis suppurativa: Secondary | ICD-10-CM

## 2011-11-30 MED ORDER — HYDROCODONE-ACETAMINOPHEN 5-500 MG PO TABS: 1.0000 | ORAL_TABLET | ORAL | Status: DC | PRN

## 2011-11-30 MED ORDER — SULFAMETHOXAZOLE-TRIMETHOPRIM 800-160 MG PO TABS: 2.0000 | ORAL_TABLET | Freq: Two times a day (BID) | ORAL | Status: DC

## 2011-12-01 NOTE — Assessment & Plan Note (Signed)
I am concerned that the ulcer in her inguinal crease might be a herpetic lesion. We discussed this in some detail. Patient very resistant to this and "does not want to hear that." I discussed possible treatment with antiviral medication the patient states she like to try oral antibiotic first. Discuss that if this really is a herpetic lesion oral antibiotic would treat any overlying superinfection but would not help the ulcer heal.  The abscess located in her gluteal cleft is very indurated. I am referring her to surgery for further evaluation of this. I be nervous and hasn't drained [place] in the offic especially as I cannot find any areas of fluctuance, exame

## 2011-12-01 NOTE — Progress Notes (Signed)
  Subjective:    Patient ID: Rachel Mckinney, female    DOB: 1976/06/18, 35 y.o.   MRN: [id number]  HPI 1.  Boils:  Patient here for FU for recurrent boils.  Still with drainage, feels the one in her groin is worsening and causing her the most pain/problems.  Still using towels and gauze pads to prevent drainage.  No nausea/vomiting, fevers, chills, abdominal pain.   Review of Systems See HPI above for review of systems.       Objective:   Physical Exam Gen:  Alert, cooperative patient who appears stated age in no acute distress.  Vital signs reviewed. Skin:  2 cm shallow ulcer located mid Left inguinal crease.  Tender to palpation. Some overlying drainage noted, there is also a 2 cm indurated, non-fluctuant abscess noted directly opposite inguinal crease ulcer.  Gluteal cleft demonstrates 2 abscess, 1 is 1 cm in size and the other is about 3 cm in size.  1 cm ulcer spontaneously draining.  3 cm abscess indurated and erythematous, but no fluctuance noted.         Assessment & Plan:

## 2011-12-01 NOTE — Assessment & Plan Note (Signed)
Patient has Dermatology appt on Dec 19.   She is to keep this appt for any further recommendations regarding long-standing history of this condition.

## 2011-12-05 ENCOUNTER — Encounter (INDEPENDENT_AMBULATORY_CARE_PROVIDER_SITE_OTHER): Payer: Self-pay | Admitting: General Surgery

## 2011-12-07 ENCOUNTER — Ambulatory Visit (INDEPENDENT_AMBULATORY_CARE_PROVIDER_SITE_OTHER): Payer: Commercial Managed Care - PPO | Admitting: General Surgery

## 2011-12-07 ENCOUNTER — Encounter (INDEPENDENT_AMBULATORY_CARE_PROVIDER_SITE_OTHER): Payer: Self-pay | Admitting: General Surgery

## 2011-12-07 VITALS — BP 142/90 | HR 76 | Temp 97.6°F | Resp 16 | Ht 65.0 in | Wt 248.0 lb

## 2011-12-07 DIAGNOSIS — T148XXA Other injury of unspecified body region, initial encounter: Secondary | ICD-10-CM

## 2011-12-07 NOTE — Progress Notes (Signed)
Subjective:     Patient ID: DANYELLE BROOKOVER, female   DOB: 1976-03-19, 35 y.o.   MRN: [id number]  HPI This is a 35 yo female has recurrent abscess use and has been operated on by Dr. Lindie Spruce for hidradenitis previously. I've also seen her for a breast abscess. For over a month she has had a chronic wound in her left groin as well as on her left buttock. She is not able to really see these areas well. These areas are painful and are continuing to have drainage at this time. She does not have any fevers. She has been on 2 courses of antibiotics with both Bactrim and doxycycline without any help. She is concerned that these areas are also foul smelling. She comes in today to discuss what to do about these.  Review of Systems     Objective:   Physical Exam  Constitutional: She appears well-developed and well-nourished.  Genitourinary:     Musculoskeletal:       Legs:     Assessment:  Chronic wounds    Plan:     I don't think there were any undrained collections present at all today. I told her again that I think she needs to stop smoking. I think the best plan for repeat reevaluate the wound care center for really right now appear like chronic wounds. There are some more definitive procedures potentially she would have to stop smoking before ever consider doing any of that.   Can follow up with Dr. Lindie Spruce who did her previous surgeries.

## 2011-12-16 ENCOUNTER — Encounter (HOSPITAL_BASED_OUTPATIENT_CLINIC_OR_DEPARTMENT_OTHER): Payer: 59 | Attending: General Surgery

## 2011-12-16 DIAGNOSIS — L02219 Cutaneous abscess of trunk, unspecified: Secondary | ICD-10-CM | POA: Insufficient documentation

## 2011-12-16 DIAGNOSIS — L732 Hidradenitis suppurativa: Secondary | ICD-10-CM | POA: Insufficient documentation

## 2011-12-16 DIAGNOSIS — Z8614 Personal history of Methicillin resistant Staphylococcus aureus infection: Secondary | ICD-10-CM | POA: Insufficient documentation

## 2011-12-16 DIAGNOSIS — L03319 Cellulitis of trunk, unspecified: Secondary | ICD-10-CM | POA: Insufficient documentation

## 2011-12-16 NOTE — Progress Notes (Signed)
[facility]  NAME:  Rachel Mckinney, Rachel Mckinney NO.:  [id number]  MEDICAL RECORD NO.:  [id number]      DATE OF BIRTH:  1976/12/10  PHYSICIAN:  Ardath Sax, M.D.           VISIT DATE:                                  OFFICE VISIT   This is a 35 year old African American female who comes to the wound clinic for the 1st time.  She has a long history of hidradenitis involving both axilla, both groin and her buttocks, also on her vulva. She has had multiple surgeries that have been somewhat successful.  She has had a wide excision of the hidradenitis of both axilla which apparently has stopped her from having recurrences in these areas.  She also had a successful excision of the areas of hidradenitis in her right groin.  However at this time her left groin has multiple draining abscesses extending posterior to anterior for a distance of about 6 inches.  This lady other than being morbidly obese is reasonably healthy.  She is not a diabetic.  She takes no medicine other than Vicodin.  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  After evaluating her with these draining infected areas of hidradenitis, I felt it would be good to start her back on the doxycycline.  We did get some cultures and I advised her to make an appointment with the surgeon again for further excisions of these areas of hidradenitis.  So she is going to do this and she will return here p.r.n.     Ardath Sax, M.D.     PP/MEDQ  D:  12/16/2011  T:  12/16/2011  Job:  [id number]

## 2011-12-23 ENCOUNTER — Encounter (HOSPITAL_BASED_OUTPATIENT_CLINIC_OR_DEPARTMENT_OTHER): Payer: 59

## 2012-01-06 ENCOUNTER — Encounter (INDEPENDENT_AMBULATORY_CARE_PROVIDER_SITE_OTHER): Payer: Commercial Managed Care - PPO | Admitting: Surgery

## 2012-01-13 ENCOUNTER — Encounter (HOSPITAL_BASED_OUTPATIENT_CLINIC_OR_DEPARTMENT_OTHER): Payer: 59

## 2012-01-18 ENCOUNTER — Other Ambulatory Visit: Payer: Self-pay | Admitting: Family Medicine

## 2012-02-10 ENCOUNTER — Encounter (INDEPENDENT_AMBULATORY_CARE_PROVIDER_SITE_OTHER): Payer: Self-pay | Admitting: Surgery

## 2012-02-10 ENCOUNTER — Ambulatory Visit (INDEPENDENT_AMBULATORY_CARE_PROVIDER_SITE_OTHER): Payer: Commercial Managed Care - PPO | Admitting: Surgery

## 2012-02-10 VITALS — BP 128/84 | HR 72 | Temp 97.4°F | Resp 18 | Ht 65.0 in | Wt 252.8 lb

## 2012-02-10 DIAGNOSIS — L732 Hidradenitis suppurativa: Secondary | ICD-10-CM | POA: Insufficient documentation

## 2012-02-10 NOTE — Progress Notes (Signed)
Rachel Mckinney 36 y.o.  Body mass index is 42.07 kg/(m^2).  Patient Active Problem List  Diagnoses  . OBESITY, NOS  . TOBACCO ABUSE  . BOILS, RECURRENT  . HYDRADENITIS  . Breast abscess of female  . Depression  . Rash  . Morbid obesity    No Known Allergies  Past Surgical History  Procedure Date  . Multiple abscess drainages    WALDEN,JEFF, MD, MD No diagnosis found.  Rachel Mckinney comes in today with recurrent hidradenitis in continuous drainage worse from her right buttock region. She has a focus of hidradenitis about 3 fingerbreadths from the areolar margin in the left breast just above where she had hidradenitis excised in past years. Dr. Lindie Spruce excised an area in her left labia which is now feels indurated and has intermittent drainage although no purulence. On the right buttock cleft there is an area of drainage with serous material.  She has had multiple operations in this problem just continues to fester. Dr. Gennette Pac saw her at the wound center and declined to do anything at that facility with the multiple areas involved. I spoke with her about Accutane treatment. I am not certified to prescribe Accutane. I spoke with Dr. Venancio Poisson at [facility] about seeing Rachel Mckinney and considering Accutane therapy. She indicated that they would be glad to see her and I will make that referral.  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 Plan consider aggressive medical therapy. I explained to the patient that Accutane had some risk of suicide and severe birth defects is taken during pregnancy. She is aware of these risks and considering medical therapy. Matt B. Daphine Deutscher, MD, [facility] [phone] beeper [phone]  02/10/2012 10:40 AM

## 2012-03-05 ENCOUNTER — Telehealth: Payer: Self-pay | Admitting: Family Medicine

## 2012-03-05 NOTE — Telephone Encounter (Signed)
Patient calling to ask for the medicine she did not ask for originally when she was seen in January for the vaginal irritation.  Please call her back if there is any question.  [facility] [address]

## 2012-03-07 MED ORDER — VALACYCLOVIR HCL 1 G PO TABS: 1000.0000 mg | ORAL_TABLET | Freq: Two times a day (BID) | ORAL | Status: DC

## 2012-03-07 NOTE — Telephone Encounter (Signed)
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  Will treat as initial lesion as this was her first diagnosis.

## 2012-03-09 ENCOUNTER — Telehealth: Payer: Self-pay | Admitting: Family Medicine

## 2012-03-09 MED ORDER — VALACYCLOVIR HCL 1 G PO TABS: 1000.0000 mg | ORAL_TABLET | Freq: Two times a day (BID) | ORAL | Status: DC

## 2012-03-09 NOTE — Telephone Encounter (Signed)
Patient is calling because the medication Dr. Gwendolyn Grant sent to [facility], she needs sent to [facility] on [address].  She is a [employer] employee and wants to keep this discreet.  I told her I would forward this to Triage, to give it an hour and then call the Pharmacy to make sure it is there before going to pick it up.

## 2012-03-09 NOTE — Telephone Encounter (Signed)
Rs cancelled at [facility] and sent electronically  to [facility] [address] as requested by patient

## 2012-05-22 ENCOUNTER — Ambulatory Visit (INDEPENDENT_AMBULATORY_CARE_PROVIDER_SITE_OTHER): Payer: 59 | Admitting: *Deleted

## 2012-05-22 DIAGNOSIS — Z309 Encounter for contraceptive management, unspecified: Secondary | ICD-10-CM

## 2012-05-22 LAB — POCT URINE PREGNANCY: Preg Test, Ur: NEGATIVE

## 2012-05-22 MED ORDER — MEDROXYPROGESTERONE ACETATE 150 MG/ML IM SUSP
150.0000 mg | Freq: Once | INTRAMUSCULAR | Status: AC
  Administered 2012-05-22: 150 mg via INTRAMUSCULAR

## 2012-05-22 NOTE — Progress Notes (Signed)
Patient is in office today with her son and ask about restarting Depo.  Her last injection was 06/20/2011. States she did not continue because she was trying to decide if she wanted to get pregnant again . Consulted with Dr. [place] and she advises may give today after negative pregnancy test and have patient return for 2nd preg test in 2 weeks. She will be due for annual CPE 06/19/2012 and she will schedule .

## 2012-05-27 ENCOUNTER — Inpatient Hospital Stay (HOSPITAL_COMMUNITY)
Admission: AD | Admit: 2012-05-27 | Discharge: 2012-05-27 | Discharge status: Against Medical Advice | Payer: 59 | Attending: Obstetrics & Gynecology | Admitting: Obstetrics & Gynecology

## 2012-06-05 ENCOUNTER — Other Ambulatory Visit: Payer: 59

## 2012-08-16 ENCOUNTER — Encounter: Payer: 59 | Admitting: Family Medicine

## 2012-09-03 ENCOUNTER — Ambulatory Visit (INDEPENDENT_AMBULATORY_CARE_PROVIDER_SITE_OTHER): Payer: 59 | Admitting: Family Medicine

## 2012-09-03 ENCOUNTER — Encounter: Payer: Self-pay | Admitting: Family Medicine

## 2012-09-03 VITALS — BP 131/84 | HR 80 | Temp 98.3°F | Ht 65.0 in | Wt 250.2 lb

## 2012-09-03 DIAGNOSIS — Z309 Encounter for contraceptive management, unspecified: Secondary | ICD-10-CM

## 2012-09-03 DIAGNOSIS — Z23 Encounter for immunization: Secondary | ICD-10-CM

## 2012-09-03 DIAGNOSIS — Z3009 Encounter for other general counseling and advice on contraception: Secondary | ICD-10-CM

## 2012-09-03 DIAGNOSIS — L732 Hidradenitis suppurativa: Secondary | ICD-10-CM

## 2012-09-03 DIAGNOSIS — Z0000 Encounter for general adult medical examination without abnormal findings: Secondary | ICD-10-CM

## 2012-09-03 MED ORDER — MEDROXYPROGESTERONE ACETATE 150 MG/ML IM SUSP
150.0000 mg | Freq: Once | INTRAMUSCULAR | Status: AC
  Administered 2012-09-03: 150 mg via INTRAMUSCULAR

## 2012-09-03 NOTE — Patient Instructions (Addendum)
It was nice meeting you today.  You can contact [facility] and schedule an appointment with surgery or dermatology.    I will fill out your FMLA form and place it at the front desk for you to pick up.  Follow up: 1 year for annual exam, Depo every 3 months (12 weeks).

## 2012-09-03 NOTE — Assessment & Plan Note (Signed)
Patient restarted Depo Provera injections today following negative pregnancy test.  Next injection in 12 weeks.

## 2012-09-03 NOTE — Assessment & Plan Note (Signed)
Patient received Flu vaccine today. Pap smear not needed until 2015 (last one was 2012).

## 2012-09-03 NOTE — Assessment & Plan Note (Signed)
Patient would like to see [facility] surgery regarding management.  Patient does not need referral as she has [facility].  Patient informed that she may have to pay higher copays due to being out of network.

## 2012-09-03 NOTE — Progress Notes (Signed)
Subjective:     Patient ID: Rachel Mckinney, female   DOB: 1976-11-18, 36 y.o.   MRN: [id number]  HPI Rachel Mckinney is a 36 year old female here today for her annual exam.  She also would like to discuss treatment of Hidradenitis suppurativa as well as contraception management.  1)  Hidradenitis suppurativa  - She has a long standing history of this condition.  - She continues to have chronic abscesses intermittently, primarily around the labia, buttocks, and breast. - Recently seen by general surgery on 01/2012.  They recommended treatment with Accutane. Due to side effect profile of Accutane, patient refused. - She would like referral to [facility] for surgery consult.  2) Contraception - Patient wants to restart Depo Provera today.  3) Preventative Care - Patient is due for Flu shot today - Last Pap smear was in 2012.  Does not need Pap smear until 2015 per guidelines.  Review of Systems Reports recurrent abscesses with draining.  No fever or chills.      Objective:   Physical Exam General:  Pleasant 36 year old who appears stated age. NAD. Heart:  RRR. No murmurs, rubs, or gallops. Lungs: CTAB. No rales, rhonchi or wheeze. Skin:  Raised, erythematous region around intergluteal cleft.  2x2 cm draining lesion noted in between buttocks.    Assessment:         Plan:

## 2012-09-11 ENCOUNTER — Ambulatory Visit: Payer: 59 | Admitting: Family Medicine

## 2013-02-21 ENCOUNTER — Emergency Department (HOSPITAL_COMMUNITY)
Admission: EM | Admit: 2013-02-21 | Discharge: 2013-02-22 | Disposition: A | Discharge status: Home / Self Care | Payer: Self-pay | Attending: Emergency Medicine | Admitting: Emergency Medicine

## 2013-02-21 ENCOUNTER — Encounter (HOSPITAL_COMMUNITY): Payer: Self-pay

## 2013-02-21 ENCOUNTER — Encounter (HOSPITAL_COMMUNITY): Payer: Self-pay | Admitting: *Deleted

## 2013-02-21 ENCOUNTER — Emergency Department (HOSPITAL_COMMUNITY)
Admission: EM | Admit: 2013-02-21 | Discharge: 2013-02-21 | Disposition: A | Discharge status: Home / Self Care | Payer: Self-pay | Attending: Emergency Medicine | Admitting: Emergency Medicine

## 2013-02-21 DIAGNOSIS — Z8659 Personal history of other mental and behavioral disorders: Secondary | ICD-10-CM | POA: Insufficient documentation

## 2013-02-21 DIAGNOSIS — F17200 Nicotine dependence, unspecified, uncomplicated: Secondary | ICD-10-CM | POA: Insufficient documentation

## 2013-02-21 DIAGNOSIS — R112 Nausea with vomiting, unspecified: Secondary | ICD-10-CM | POA: Insufficient documentation

## 2013-02-21 DIAGNOSIS — Z872 Personal history of diseases of the skin and subcutaneous tissue: Secondary | ICD-10-CM | POA: Insufficient documentation

## 2013-02-21 DIAGNOSIS — I10 Essential (primary) hypertension: Secondary | ICD-10-CM | POA: Insufficient documentation

## 2013-02-21 DIAGNOSIS — K047 Periapical abscess without sinus: Secondary | ICD-10-CM

## 2013-02-21 DIAGNOSIS — K089 Disorder of teeth and supporting structures, unspecified: Secondary | ICD-10-CM | POA: Insufficient documentation

## 2013-02-21 DIAGNOSIS — K0889 Other specified disorders of teeth and supporting structures: Secondary | ICD-10-CM

## 2013-02-21 DIAGNOSIS — Z79899 Other long term (current) drug therapy: Secondary | ICD-10-CM | POA: Insufficient documentation

## 2013-02-21 DIAGNOSIS — K044 Acute apical periodontitis of pulpal origin: Secondary | ICD-10-CM | POA: Insufficient documentation

## 2013-02-21 DIAGNOSIS — R0602 Shortness of breath: Secondary | ICD-10-CM | POA: Insufficient documentation

## 2013-02-21 DIAGNOSIS — R42 Dizziness and giddiness: Secondary | ICD-10-CM | POA: Insufficient documentation

## 2013-02-21 MED ORDER — CLINDAMYCIN HCL 300 MG PO CAPS: 300.0000 mg | ORAL_CAPSULE | Freq: Four times a day (QID) | ORAL | Status: DC

## 2013-02-21 MED ORDER — HYDROMORPHONE HCL PF 1 MG/ML IJ SOLN
1.0000 mg | Freq: Once | INTRAMUSCULAR | Status: AC
  Administered 2013-02-22: 1 mg via INTRAVENOUS
  Filled 2013-02-21: qty 1

## 2013-02-21 MED ORDER — ONDANSETRON 4 MG PO TBDP
8.0000 mg | ORAL_TABLET | Freq: Once | ORAL | Status: AC
  Administered 2013-02-21: 8 mg via ORAL
  Filled 2013-02-21: qty 2

## 2013-02-21 MED ORDER — SODIUM CHLORIDE 0.9 % IV SOLN
Freq: Once | INTRAVENOUS | Status: AC
  Administered 2013-02-22: via INTRAVENOUS

## 2013-02-21 MED ORDER — ONDANSETRON HCL 4 MG/2ML IJ SOLN: 4.0000 mg | Freq: Once | INTRAMUSCULAR | Status: DC

## 2013-02-21 MED ORDER — OXYCODONE-ACETAMINOPHEN 5-325 MG PO TABS: 1.0000 | ORAL_TABLET | Freq: Four times a day (QID) | ORAL | Status: DC | PRN

## 2013-02-21 MED ORDER — CLINDAMYCIN PHOSPHATE 900 MG/50ML IV SOLN
900.0000 mg | Freq: Once | INTRAVENOUS | Status: AC
  Administered 2013-02-22: 900 mg via INTRAVENOUS
  Filled 2013-02-21: qty 50

## 2013-02-21 NOTE — ED Notes (Signed)
Patient presents with left sided facial swelling since this AM.  Patient has chipped left upper molar for 6 months and pain since last night.  Airway clear, no neck swelling.  Patient speaking in complete sentences.

## 2013-02-21 NOTE — ED Notes (Signed)
Pt states she was seen here yesterday for the same but the left facial swelling has increased since earlier this morning.  D/c with dx of dental abcess.  States has started taking abx at home.  Also c/o n/v, and fevers.

## 2013-02-21 NOTE — ED Provider Notes (Signed)
History     CSN: [id number]  Arrival date & time 02/21/13  0945   First MD Initiated Contact with Patient 02/21/13 1003      Chief Complaint  Patient presents with  . Facial Swelling    (Consider location/radiation/quality/duration/timing/severity/associated sxs/prior treatment) HPI Comments: 37 year old female presents to the emergency department complaining of left-sided facial swelling x1 day. States she woke up this morning in the left side of her face was swollen. Last night when she went to bed she had a toothache on the left side in her upper molars. She has had a chipped left upper molar for the past 6 months, however has not had a problem with it. She describes the pain as sharp, rated "over 10 out of 10" radiating to her ear. She has tried taking Goody powders without any relief. Denies fever, chills, difficulty swallowing.  The history is provided by the patient.    Past Medical History  Diagnosis Date  . Boils     under breast and inner thigh  . Hidradenitis suppurativa     Chronic problem for patient, multiple admissions for surgical drainage  . Depression     Past Surgical History  Procedure Laterality Date  . Multiple abscess drainages      Family History  Problem Relation Age of Onset  . Liver disease Father   . Hypertension Father   . Hypertension Sister     History  Substance Use Topics  . Smoking status: Current Every Day Smoker -- 0.30 packs/day    Types: Cigarettes  . Smokeless tobacco: Not on file  . Alcohol Use: Yes     Comment: on weekends    OB History   Grav Para Term Preterm Abortions TAB SAB Ect Mult Living                  Review of Systems  Constitutional: Negative for fever and chills.  HENT: Positive for facial swelling and dental problem. Negative for trouble swallowing, neck pain, neck stiffness and voice change.   Respiratory: Negative for stridor.   All other systems reviewed and are negative.    Allergies  Review of  patient's allergies indicates no known allergies.  Home Medications   Current Outpatient Rx  Name  Route  Sig  Dispense  Refill  . Aspirin-Acetaminophen-Caffeine (GOODY HEADACHE PO)   Oral   Take 1 packet by mouth daily as needed (for pain.).         Marland Kitchen clindamycin (CLEOCIN) 300 MG capsule   Oral   Take 1 capsule (300 mg total) by mouth 4 (four) times daily. X 7 days   28 capsule   0   . oxyCODONE-acetaminophen (PERCOCET) 5-325 MG per tablet   Oral   Take 1-2 tablets by mouth every 6 (six) hours as needed for pain.   8 tablet   0     BP 173/90  Pulse 70  Temp(Src) 97.5 F (36.4 C) (Oral)  SpO2 97%  Physical Exam  Nursing note and vitals reviewed. Constitutional: She is oriented to person, place, and time. She appears well-developed and well-nourished. No distress.  HENT:  Head: Normocephalic and atraumatic. No trismus in the jaw.    Mouth/Throat: Uvula is midline, oropharynx is clear and moist and mucous membranes are normal. Abnormal dentition.    Eyes: Conjunctivae are normal.  Neck: Normal range of motion. Neck supple. No tracheal deviation present.  Cardiovascular: Normal rate, regular rhythm and normal heart sounds.   Pulmonary/Chest: Effort  normal and breath sounds normal. No respiratory distress.  Musculoskeletal: Normal range of motion. She exhibits no edema.  Lymphadenopathy:       Head (left side): Submandibular adenopathy present.  Neurological: She is alert and oriented to person, place, and time.  Skin: Skin is warm and dry. No erythema.  Psychiatric: She has a normal mood and affect. Her behavior is normal.    ED Course  Procedures (including critical care time)  Labs Reviewed - No data to display No results found.   1. Dental infection   2. Pain, dental       MDM   Dental pain associated with dental infection. No evidence of dental abscess. Patient is afebrile, non toxic appearing and swallowing secretions well. I gave patient referral  to dentist and stressed the importance of dental follow up for ultimate management of dental pain. I will also give clindamycin and pain control. Patient voices understanding and is agreeable to plan.         Trevor Mace, PA-C 02/21/13 1057

## 2013-02-22 ENCOUNTER — Encounter (HOSPITAL_COMMUNITY): Payer: Self-pay | Admitting: Radiology

## 2013-02-22 ENCOUNTER — Emergency Department (HOSPITAL_COMMUNITY): Payer: Self-pay

## 2013-02-22 LAB — POCT I-STAT, CHEM 8
BUN: 6 mg/dL (ref 6–23)
Chloride: 103 mEq/L (ref 96–112)
Potassium: 3.9 mEq/L (ref 3.5–5.1)
Sodium: 139 mEq/L (ref 135–145)

## 2013-02-22 LAB — CBC
Platelets: 265 10*3/uL (ref 150–400)
RBC: 3.77 MIL/uL — ABNORMAL LOW (ref 3.87–5.11)
WBC: 11.7 10*3/uL — ABNORMAL HIGH (ref 4.0–10.5)

## 2013-02-22 MED ORDER — OXYCODONE-ACETAMINOPHEN 5-325 MG PO TABS: 1.0000 | ORAL_TABLET | ORAL | Status: DC | PRN

## 2013-02-22 MED ORDER — OXYCODONE-ACETAMINOPHEN 5-325 MG PO TABS
1.0000 | ORAL_TABLET | Freq: Once | ORAL | Status: AC
  Administered 2013-02-22: 1 via ORAL
  Filled 2013-02-22: qty 1

## 2013-02-22 MED ORDER — AMLODIPINE BESYLATE 5 MG PO TABS
5.0000 mg | ORAL_TABLET | Freq: Once | ORAL | Status: AC
  Administered 2013-02-22: 5 mg via ORAL
  Filled 2013-02-22: qty 1

## 2013-02-22 MED ORDER — AMLODIPINE BESYLATE 5 MG PO TABS: 5.0000 mg | ORAL_TABLET | Freq: Once | ORAL | Status: DC

## 2013-02-22 NOTE — ED Provider Notes (Signed)
History     CSN: [id number]  Arrival date & time 02/21/13  2118   First MD Initiated Contact with Patient 02/21/13 2346      Chief Complaint  Patient presents with  . Oral Swelling    (Consider location/radiation/quality/duration/timing/severity/associated sxs/prior treatment) HPI Comments: Patient was seen in this emergency room less than 24 hours ago, diagnosed with dental abscess, started on clindamycin by mouth as well as pain control.  Patient was refer to dentist.  She has an appointment on Monday.  She has taken 3 doses in but has had increased facial swelling, pain, new onset of nausea and vomiting.  She also notes, that she's had elevated blood pressure, for the last week, says a family history of elevated blood pressure.  She does have care physician.  She reports, that she's been having headaches, right arm heaviness, and some slight short of breath on exertion.   The history is provided by the patient.    Past Medical History  Diagnosis Date  . Boils     under breast and inner thigh  . Hidradenitis suppurativa     Chronic problem for patient, multiple admissions for surgical drainage  . Depression     Past Surgical History  Procedure Laterality Date  . Multiple abscess drainages      Family History  Problem Relation Age of Onset  . Liver disease Father   . Hypertension Father   . Hypertension Sister     History  Substance Use Topics  . Smoking status: Current Every Day Smoker -- 0.30 packs/day    Types: Cigarettes  . Smokeless tobacco: Not on file  . Alcohol Use: Yes     Comment: on weekends    OB History   Grav Para Term Preterm Abortions TAB SAB Ect Mult Living                  Review of Systems  Constitutional: Negative for fever and chills.  HENT: Positive for dental problem. Negative for drooling and trouble swallowing.   Respiratory: Positive for shortness of breath. Negative for cough.   Cardiovascular: Negative for chest pain and leg  swelling.  Gastrointestinal: Positive for nausea and vomiting.  Skin: Negative for pallor.  Neurological: Positive for headaches. Negative for dizziness, speech difficulty, weakness and numbness.  All other systems reviewed and are negative.    Allergies  Review of patient's allergies indicates no known allergies.  Home Medications   Current Outpatient Rx  Name  Route  Sig  Dispense  Refill  . Aspirin-Acetaminophen-Caffeine (GOODY HEADACHE PO)   Oral   Take 1 packet by mouth daily as needed (for pain.).         Marland Kitchen clindamycin (CLEOCIN) 300 MG capsule   Oral   Take 1 capsule (300 mg total) by mouth 4 (four) times daily. X 7 days   28 capsule   0   . oxyCODONE-acetaminophen (PERCOCET) 5-325 MG per tablet   Oral   Take 1-2 tablets by mouth every 6 (six) hours as needed for pain.   8 tablet   0   . amLODipine (NORVASC) 5 MG tablet   Oral   Take 1 tablet (5 mg total) by mouth once.   30 tablet   3     Take at night   . oxyCODONE-acetaminophen (PERCOCET/ROXICET) 5-325 MG per tablet   Oral   Take 1 tablet by mouth every 4 (four) hours as needed for pain.   20 tablet  0     1-2 tabs every 4-6 hours     BP 180/95  Pulse 79  Temp(Src) 99 F (37.2 C) (Oral)  Resp 16  SpO2 99%  Physical Exam  Constitutional: She is oriented to person, place, and time. She appears well-developed and well-nourished.  Morbidly obese  HENT:  Head: Normocephalic and atraumatic.    Right Ear: External ear normal.  Left Ear: External ear normal.  Mouth/Throat: Dental abscesses present.    Left-sided facial swelling, up to and including the lower eyelid, tearing of the left eye, as well as scleral injection  Eyes: Pupils are equal, round, and reactive to light. Left conjunctiva is injected. Left eye exhibits normal extraocular motion.  Neck: Normal range of motion.  Cardiovascular: Normal rate.   Lymphadenopathy:    She has cervical adenopathy.  Neurological: She is alert and  oriented to person, place, and time.  Skin: Skin is warm. There is erythema.    ED Course  Procedures (including critical care time)  Labs Reviewed  CBC - Abnormal; Notable for the following:    WBC 11.7 (*)    RBC 3.77 (*)    HCT 35.5 (*)    All other components within normal limits  POCT I-STAT, CHEM 8 - Abnormal; Notable for the following:    Glucose, Bld 103 (*)    All other components within normal limits   Ct Head Wo Contrast  02/22/2013  *RADIOLOGY REPORT*  Clinical Data:  Left-sided facial swelling for 1 day.  Recent toothache.  CT HEAD WITHOUT CONTRAST CT MAXILLOFACIAL WITHOUT CONTRAST  Technique:  Multidetector CT imaging of the head and maxillofacial structures were performed using the standard protocol without intravenous contrast. Multiplanar CT image reconstructions of the maxillofacial structures were also generated.  Comparison:   None.  CT HEAD  Findings: There is no evidence of acute infarction, mass lesion, or intra- or extra-axial hemorrhage on CT.  The posterior fossa, including the cerebellum, brainstem and fourth ventricle, is within normal limits.  The third and lateral ventricles, and basal ganglia are unremarkable in appearance.  The cerebral hemispheres are symmetric in appearance, with normal gray- white differentiation.  No mass effect or midline shift is seen.  There is no evidence of fracture; visualized osseous structures are unremarkable in appearance.  The orbits are within normal limits. There is partial opacification of the sphenoid sinus; the remaining paranasal sinuses and mastoid air cells are well-aerated.  Mild soft tissue swelling is noted overlying the left maxilla.  IMPRESSION:  1.  No acute intracranial pathology seen on CT. 2.  Mild soft tissue swelling overlying the left maxilla. 3.  Partial opacification of the sphenoid sinus.  CT MAXILLOFACIAL  Findings:   There is no evidence of fracture or dislocation.  The maxilla and mandible appear intact.  The  nasal bone is unremarkable in appearance.  There is absence of much of the left first maxillary premolar, with a trace associated periapical abscess.  This may demonstrate minimal cortical breakthrough, which could explain the relatively diffuse soft tissue inflammation along the left side of the face, tracking from the level of the mandible superiorly to the left cheek.  No significant soft tissue abscess identified.  The soft tissue inflammation extends posteriorly to the level of the anterior aspect of the left parotid gland.  There is mild soft tissue thickening surrounding the left orbit; the left orbit remains unremarkable in appearance.  The right orbit is intact.  Significant mucosal thickening is noted  within the left maxillary sinus, and there is partial opacification of the sphenoid sinus.  The remaining paranasal sinuses and mastoid air cells are well-aerated.  The parapharyngeal fat planes are preserved.  The nasopharynx, oropharynx and hypopharynx are unremarkable in appearance.  The visualized portions of the valleculae and piriform sinuses are grossly unremarkable.  The parotid and submandibular glands are within normal limits.  No cervical lymphadenopathy is seen.  IMPRESSION:  1.  Absence of much of the left first maxillary premolar, with a trace associated periapical abscess.  This may demonstrate minimal cortical breakthrough, which could explain the relatively diffuse soft tissue inflammation involving the left side of the face, tracking from the level of the mandible superiorly to the left cheek.  This extends posteriorly to the anterior aspect of the left parotid gland.  No definite soft tissue abscess seen. 2.  Significant mucosal thickening within the left maxillary sinus, and partial opacification of the sphenoid sinus.   Original Report Authenticated By: Tonia Ghent, M.D.    Ct Maxillofacial Wo Cm  02/22/2013  *RADIOLOGY REPORT*  Clinical Data:  Left-sided facial swelling for 1 day.   Recent toothache.  CT HEAD WITHOUT CONTRAST CT MAXILLOFACIAL WITHOUT CONTRAST  Technique:  Multidetector CT imaging of the head and maxillofacial structures were performed using the standard protocol without intravenous contrast. Multiplanar CT image reconstructions of the maxillofacial structures were also generated.  Comparison:   None.  CT HEAD  Findings: There is no evidence of acute infarction, mass lesion, or intra- or extra-axial hemorrhage on CT.  The posterior fossa, including the cerebellum, brainstem and fourth ventricle, is within normal limits.  The third and lateral ventricles, and basal ganglia are unremarkable in appearance.  The cerebral hemispheres are symmetric in appearance, with normal gray- white differentiation.  No mass effect or midline shift is seen.  There is no evidence of fracture; visualized osseous structures are unremarkable in appearance.  The orbits are within normal limits. There is partial opacification of the sphenoid sinus; the remaining paranasal sinuses and mastoid air cells are well-aerated.  Mild soft tissue swelling is noted overlying the left maxilla.  IMPRESSION:  1.  No acute intracranial pathology seen on CT. 2.  Mild soft tissue swelling overlying the left maxilla. 3.  Partial opacification of the sphenoid sinus.  CT MAXILLOFACIAL  Findings:   There is no evidence of fracture or dislocation.  The maxilla and mandible appear intact.  The nasal bone is unremarkable in appearance.  There is absence of much of the left first maxillary premolar, with a trace associated periapical abscess.  This may demonstrate minimal cortical breakthrough, which could explain the relatively diffuse soft tissue inflammation along the left side of the face, tracking from the level of the mandible superiorly to the left cheek.  No significant soft tissue abscess identified.  The soft tissue inflammation extends posteriorly to the level of the anterior aspect of the left parotid gland.  There  is mild soft tissue thickening surrounding the left orbit; the left orbit remains unremarkable in appearance.  The right orbit is intact.  Significant mucosal thickening is noted within the left maxillary sinus, and there is partial opacification of the sphenoid sinus.  The remaining paranasal sinuses and mastoid air cells are well-aerated.  The parapharyngeal fat planes are preserved.  The nasopharynx, oropharynx and hypopharynx are unremarkable in appearance.  The visualized portions of the valleculae and piriform sinuses are grossly unremarkable.  The parotid and submandibular glands are within normal limits.  No cervical lymphadenopathy is seen.  IMPRESSION:  1.  Absence of much of the left first maxillary premolar, with a trace associated periapical abscess.  This may demonstrate minimal cortical breakthrough, which could explain the relatively diffuse soft tissue inflammation involving the left side of the face, tracking from the level of the mandible superiorly to the left cheek.  This extends posteriorly to the anterior aspect of the left parotid gland.  No definite soft tissue abscess seen. 2.  Significant mucosal thickening within the left maxillary sinus, and partial opacification of the sphenoid sinus.   Original Report Authenticated By: Tonia Ghent, M.D.      1. Dental abscess   2. Hypertension     ED ECG REPORT   Date: 02/22/2013  EKG Time: 1:27 AM  Rate: 83  Rhythm: normal sinus rhythm,  there are no previous tracings available for comparison  Axis: normal  Intervals:none  ST&T Change: none  Narrative Interpretation: normal            MDM  Start patient on 5 mg Norvasc daily.  I've given her 30 day prescription with 3 refills to allow her to establish with a primary care physician.  I've instructed her to take the tablet at night to decrease any side effects that make her swelling and redness in her face has decreased and she has received IV clindamycin.  CT scan reviewed.   There is no periorbital or orbital cellulitis or infection and encouraged to continue her antibiotic, and pain control, and keep her dental appointment as scheduled on Monday        Arman Filter, NP 02/22/13 0125  Arman Filter, NP 02/22/13 0127

## 2013-02-22 NOTE — ED Provider Notes (Signed)
Medical screening examination/treatment/procedure(s) were performed by non-physician practitioner and as supervising physician I was immediately available for consultation/collaboration.    Vida Roller, MD 02/22/13 [phone]

## 2013-02-22 NOTE — ED Provider Notes (Signed)
Medical screening examination/treatment/procedure(s) were performed by non-physician practitioner and as supervising physician I was immediately available for consultation/collaboration.  Olivia Mackie, MD 02/22/13 [phone]

## 2013-02-22 NOTE — ED Notes (Signed)
Pt returned from radiology.

## 2014-01-14 ENCOUNTER — Ambulatory Visit: Payer: 59 | Admitting: Family Medicine

## 2015-02-27 ENCOUNTER — Inpatient Hospital Stay (HOSPITAL_COMMUNITY)
Admission: AD | Admit: 2015-02-27 | Discharge: 2015-02-27 | Disposition: A | Discharge status: Home / Self Care | Payer: Medicaid Other | Source: Ambulatory Visit | Attending: Family Medicine | Admitting: Family Medicine

## 2015-02-27 ENCOUNTER — Encounter (HOSPITAL_COMMUNITY): Payer: Self-pay

## 2015-02-27 DIAGNOSIS — N939 Abnormal uterine and vaginal bleeding, unspecified: Secondary | ICD-10-CM

## 2015-02-27 DIAGNOSIS — F17210 Nicotine dependence, cigarettes, uncomplicated: Secondary | ICD-10-CM | POA: Insufficient documentation

## 2015-02-27 DIAGNOSIS — I10 Essential (primary) hypertension: Secondary | ICD-10-CM | POA: Diagnosis not present

## 2015-02-27 HISTORY — DX: Essential (primary) hypertension: I10

## 2015-02-27 LAB — URINALYSIS, ROUTINE W REFLEX MICROSCOPIC
Bilirubin Urine: NEGATIVE
Glucose, UA: NEGATIVE mg/dL
Ketones, ur: 15 mg/dL — AB
Leukocytes, UA: NEGATIVE
NITRITE: NEGATIVE
PH: 5.5 (ref 5.0–8.0)
Protein, ur: NEGATIVE mg/dL
UROBILINOGEN UA: 0.2 mg/dL (ref 0.0–1.0)

## 2015-02-27 LAB — CBC
HEMATOCRIT: 34.1 % — AB (ref 36.0–46.0)
Hemoglobin: 11.4 g/dL — ABNORMAL LOW (ref 12.0–15.0)
MCH: 31.9 pg (ref 26.0–34.0)
MCHC: 33.4 g/dL (ref 30.0–36.0)
MCV: 95.5 fL (ref 78.0–100.0)
PLATELETS: 274 10*3/uL (ref 150–400)
RBC: 3.57 MIL/uL — ABNORMAL LOW (ref 3.87–5.11)
RDW: 13.7 % (ref 11.5–15.5)
WBC: 9.2 10*3/uL (ref 4.0–10.5)

## 2015-02-27 LAB — POCT PREGNANCY, URINE: PREG TEST UR: NEGATIVE

## 2015-02-27 LAB — URINE MICROSCOPIC-ADD ON

## 2015-02-27 MED ORDER — MEDROXYPROGESTERONE ACETATE 10 MG PO TABS: 10.0000 mg | ORAL_TABLET | Freq: Every day | ORAL | Status: DC

## 2015-02-27 MED ORDER — HYDROCHLOROTHIAZIDE 25 MG PO TABS: 25.0000 mg | ORAL_TABLET | Freq: Every day | ORAL | Status: DC

## 2015-02-27 NOTE — MAU Note (Signed)
I have been on my period since 2/24. Mild cramping yest and today. Used 3 pads today. Like my regular period

## 2015-02-27 NOTE — Discharge Instructions (Signed)
Abnormal Uterine Bleeding Abnormal uterine bleeding can affect women at various stages in life, including teenagers, women in their reproductive years, pregnant women, and women who have reached menopause. Several kinds of uterine bleeding are considered abnormal, including:  Bleeding or spotting between periods.   Bleeding after sexual intercourse.   Bleeding that is heavier or more than normal.   Periods that last longer than usual.  Bleeding after menopause.  Many cases of abnormal uterine bleeding are minor and simple to treat, while others are more serious. Any type of abnormal bleeding should be evaluated by your health care provider. Treatment will depend on the cause of the bleeding. HOME CARE INSTRUCTIONS Monitor your condition for any changes. The following actions may help to alleviate any discomfort you are experiencing:  Avoid the use of tampons and douches as directed by your health care provider.  Change your pads frequently. You should get regular pelvic exams and Pap tests. Keep all follow-up appointments for diagnostic tests as directed by your health care provider.  SEEK MEDICAL CARE IF:   Your bleeding lasts more than 1 week.   You feel dizzy at times.  SEEK IMMEDIATE MEDICAL CARE IF:   You pass out.   You are changing pads every 15 to 30 minutes.   You have abdominal pain.  You have a fever.   You become sweaty or weak.   You are passing large blood clots from the vagina.   You start to feel nauseous and vomit. MAKE SURE YOU:   Understand these instructions.  Will watch your condition.  Will get help right away if you are not doing well or get worse. Document Released: 12/05/2005 Document Revised: 12/10/2013 Document Reviewed: 07/04/2013 Exit[employer] Patient Information 2015 [employer]. This information is not intended to replace advice given to you by your health care provider. Make sure you discuss any questions you have with your  health care provider.  

## 2015-02-27 NOTE — MAU Provider Note (Signed)
History     CSN: [id number]  Arrival date and time: 02/27/15 2012   First Provider Initiated Contact with Patient 02/27/15 2057      Chief Complaint  Patient presents with  . Vaginal Bleeding   HPI   [name] is a 39 y.o. female G3P0021 at Unknown who presents with vaginal bleeding since 02/11/2015.  She has been bleeding everyday.  History of hypertension; never had to take medication.   This is the second time that she has had a heavy- long period.   She was on depo for 7-8 years and stopped using that about 1 year ago.    OB History    Gravida Para Term Preterm AB TAB SAB Ectopic Multiple Living   3    2     1       Past Medical History  Diagnosis Date  . Boils     under breast and inner thigh  . Hidradenitis suppurativa     Chronic problem for patient, multiple admissions for surgical drainage  . Hypertension     Past Surgical History  Procedure Laterality Date  . Multiple abscess drainages    . Cesarean section      Family History  Problem Relation Age of Onset  . Liver disease Father   . Hypertension Father   . Hypertension Sister     History  Substance Use Topics  . Smoking status: Current Every Day Smoker -- 0.30 packs/day    Types: Cigarettes  . Smokeless tobacco: Not on file  . Alcohol Use: Yes     Comment: on weekends    Allergies: No Known Allergies  Prescriptions prior to admission  Medication Sig Dispense Refill Last Dose  . GuaiFENesin (COUGH SYRUP PO) Take 15 mLs by mouth 2 (two) times daily as needed (cough).   Past Month at Unknown time  . amLODipine (NORVASC) 5 MG tablet Take 1 tablet (5 mg total) by mouth once. (Patient not taking: Reported on 02/27/2015) 30 tablet 3   . clindamycin (CLEOCIN) 300 MG capsule Take 1 capsule (300 mg total) by mouth 4 (four) times daily. X 7 days (Patient not taking: Reported on 02/27/2015) 28 capsule 0 02/21/2013 at Unknown  . oxyCODONE-acetaminophen (PERCOCET) 5-325 MG per tablet Take 1-2  tablets by mouth every 6 (six) hours as needed for pain. (Patient not taking: Reported on 02/27/2015) 8 tablet 0 02/21/2013 at Unknown  . oxyCODONE-acetaminophen (PERCOCET/ROXICET) 5-325 MG per tablet Take 1 tablet by mouth every 4 (four) hours as needed for pain. (Patient not taking: Reported on 02/27/2015) 20 tablet 0    Results for orders placed or performed during the hospital encounter of 02/27/15 (from the past 48 hour(s))  Urinalysis, Routine w reflex microscopic     Status: Abnormal   Collection Time: 02/27/15  8:30 PM  Result Value Ref Range   Color, Urine YELLOW YELLOW   APPearance CLEAR CLEAR   Specific Gravity, Urine >1.030 (H) 1.005 - 1.030   pH 5.5 5.0 - 8.0   Glucose, UA NEGATIVE NEGATIVE mg/dL   Hgb urine dipstick LARGE (A) NEGATIVE   Bilirubin Urine NEGATIVE NEGATIVE   Ketones, ur 15 (A) NEGATIVE mg/dL   Protein, ur NEGATIVE NEGATIVE mg/dL   Urobilinogen, UA 0.2 0.0 - 1.0 mg/dL   Nitrite NEGATIVE NEGATIVE   Leukocytes, UA NEGATIVE NEGATIVE  Urine microscopic-add on     Status: Abnormal   Collection Time: 02/27/15  8:30 PM  Result Value Ref Range   Squamous  Epithelial / LPF RARE RARE   WBC, UA 0-2 <3 WBC/hpf   RBC / HPF 7-10 <3 RBC/hpf   Bacteria, UA RARE RARE   Crystals CA OXALATE CRYSTALS (A) NEGATIVE  Pregnancy, urine POC     Status: None   Collection Time: 02/27/15  8:35 PM  Result Value Ref Range   Preg Test, Ur NEGATIVE NEGATIVE    Comment:        THE SENSITIVITY OF THIS METHODOLOGY IS >24 mIU/mL   CBC     Status: Abnormal   Collection Time: 02/27/15  9:59 PM  Result Value Ref Range   WBC 9.2 4.0 - 10.5 K/uL   RBC 3.57 (L) 3.87 - 5.11 MIL/uL   Hemoglobin 11.4 (L) 12.0 - 15.0 g/dL   HCT 34.1 (L) 36.0 - 46.0 %   MCV 95.5 78.0 - 100.0 fL   MCH 31.9 26.0 - 34.0 pg   MCHC 33.4 30.0 - 36.0 g/dL   RDW 13.7 11.5 - 15.5 %   Platelets 274 150 - 400 K/uL    Review of Systems  Constitutional: Negative for fever and chills.  Gastrointestinal: Negative for  nausea, vomiting and abdominal pain.  Genitourinary:       +vaginal bleeding   Neurological: Negative for dizziness and weakness.   Physical Exam   Blood pressure 153/94, pulse 83, temperature 98.8 F (37.1 C), resp. rate 18, height 5\' 4"  (1.626 m), weight 114.216 kg (251 lb 12.8 oz), last menstrual period 02/11/2015, SpO2 100 %.  Physical Exam  Constitutional: She is oriented to person, place, and time. She appears well-developed and well-nourished. No distress.  HENT:  Head: Normocephalic.  Eyes: Pupils are equal, round, and reactive to light.  Neck: Neck supple.  Cardiovascular: Normal rate.   Respiratory: Effort normal.  GI: Soft.  Genitourinary:  Speculum exam: Vagina - Small amount of creamy discharge, no odor Cervix - No contact bleeding Bimanual exam: Cervix closed Uterus non tender, normal size Adnexa non tender, no masses bilaterally GC/Chlam, wet prep done Chaperone present for exam. Speculum exam: Vagina - Moderate about of dark red blood in vaginal canal.  Cervix - +active bleeding  Bimanual exam: Cervix closed, no CMT  Uterus non tender, normal size Adnexa non tender, no masses bilaterally Chaperone present for exam.   Musculoskeletal: Normal range of motion.  Neurological: She is alert and oriented to person, place, and time.  Skin: She is not diaphoretic.  Psychiatric: Her behavior is normal.    MAU Course  Procedures  None  None  MDM CBC UA  UPT   Assessment and Plan   A:  1. Abnormal vaginal bleeding   2.     Chronic hypertension   P:  Discharge home in stable condition RX: Provera, HCTZ Bleeding precautions Increase water intake  Stroke-MI precautions; patient to go to [facility] with any Chest pain, SOB, stroke symptoms   Lezlie Lye, NP 02/27/2015 10:23 PM

## 2015-04-30 ENCOUNTER — Encounter (HOSPITAL_COMMUNITY): Payer: Self-pay | Admitting: Emergency Medicine

## 2015-04-30 ENCOUNTER — Emergency Department (HOSPITAL_COMMUNITY)
Admission: EM | Admit: 2015-04-30 | Discharge: 2015-04-30 | Disposition: A | Discharge status: Home / Self Care | Payer: Medicaid Other | Source: Home / Self Care | Attending: Family Medicine | Admitting: Family Medicine

## 2015-04-30 DIAGNOSIS — L732 Hidradenitis suppurativa: Secondary | ICD-10-CM | POA: Diagnosis not present

## 2015-04-30 DIAGNOSIS — L02214 Cutaneous abscess of groin: Secondary | ICD-10-CM | POA: Diagnosis not present

## 2015-04-30 MED ORDER — HYDROCODONE-ACETAMINOPHEN 5-325 MG PO TABS: 1.0000 | ORAL_TABLET | Freq: Four times a day (QID) | ORAL | Status: DC | PRN

## 2015-04-30 MED ORDER — CLINDAMYCIN HCL 300 MG PO CAPS: 300.0000 mg | ORAL_CAPSULE | Freq: Three times a day (TID) | ORAL | Status: DC

## 2015-04-30 NOTE — ED Provider Notes (Signed)
CSN: [id number]     Arrival date & time 04/30/15  1813 History   First MD Initiated Contact with Patient 04/30/15 1848     Chief Complaint  Patient presents with  . Abscess   (Consider location/radiation/quality/duration/timing/severity/associated sxs/prior Treatment) Patient is a 39 y.o. female presenting with abscess. The history is provided by the patient.  Abscess Location:  Ano-genital (right inguinal fold) Ano-genital abscess location:  Groin Size:  3cm Abscess quality: fluctuance, induration, painful, redness and warmth   Red streaking: no   Duration:  2 days Progression:  Worsening   Past Medical History  Diagnosis Date  . Boils     under breast and inner thigh  . Hidradenitis suppurativa     Chronic problem for patient, multiple admissions for surgical drainage  . Hypertension    Past Surgical History  Procedure Laterality Date  . Multiple abscess drainages    . Cesarean section     Family History  Problem Relation Age of Onset  . Liver disease Father   . Hypertension Father   . Hypertension Sister    History  Substance Use Topics  . Smoking status: Current Every Day Smoker -- 0.30 packs/day    Types: Cigarettes  . Smokeless tobacco: Not on file  . Alcohol Use: Yes     Comment: on weekends   OB History    Gravida Para Term Preterm AB TAB SAB Ectopic Multiple Living   3    2     1      Review of Systems  All other systems reviewed and are negative.   Allergies  Review of patient's allergies indicates no known allergies.  Home Medications   Prior to Admission medications   Medication Sig Start Date End Date Taking? Authorizing Provider  clindamycin (CLEOCIN) 300 MG capsule Take 1 capsule (300 mg total) by mouth 3 (three) times daily. X 7 days 04/30/15   Audelia Hives Chaynce Schafer, PA  GuaiFENesin (COUGH SYRUP PO) Take 15 mLs by mouth 2 (two) times daily as needed (cough).    Historical Provider, MD  hydrochlorothiazide (HYDRODIURIL) 25 MG tablet Take 1  tablet (25 mg total) by mouth daily. 02/27/15   Lezlie Lye, NP  HYDROcodone-acetaminophen (NORCO/VICODIN) 5-325 MG per tablet Take 1 tablet by mouth every 6 (six) hours as needed for moderate pain or severe pain. 04/30/15   Audelia Hives Corrina Steffensen, PA  medroxyPROGESTERone (PROVERA) 10 MG tablet Take 1 tablet (10 mg total) by mouth daily. 02/27/15   Artist Pais Rasch, NP   BP 146/92 mmHg  Pulse 98  Temp(Src) 99.2 F (37.3 C) (Oral)  Resp 20  SpO2 100%  LMP 04/12/2015 Physical Exam  Constitutional: She is oriented to person, place, and time. She appears well-developed and well-nourished. No distress.  +obese  HENT:  Head: Normocephalic.  Eyes: Conjunctivae are normal.  Cardiovascular: Normal rate.   Pulmonary/Chest: Effort normal.  Musculoskeletal: Normal range of motion.  Neurological: She is alert and oriented to person, place, and time.  Skin: Skin is warm and dry.  3cm x 3 cm fluctuant abscess right inguinal area  Psychiatric: She has a normal mood and affect. Her behavior is normal.  Nursing note and vitals reviewed.   ED Course  INCISION AND DRAINAGE Date/Time: 04/30/2015 7:44 PM Performed by: Griselda Miner LEE H Authorized by: Gregor Hams Consent: Verbal consent obtained. Written consent not obtained. Risks and benefits: risks, benefits and alternatives were discussed Consent given by: patient Patient understanding: patient states understanding  of the procedure being performed Patient identity confirmed: verbally with patient Time out: Immediately prior to procedure a "time out" was called to verify the correct patient, procedure, equipment, support staff and site/side marked as required. Type: abscess Body area: anogenital (right inguinal fold) Local anesthetic: co-phenylcaine spray Patient sedated: no Scalpel size: 11 Incision type: single straight Complexity: simple Drainage: purulent Drainage amount: copious Wound treatment: wound left open Packing  material: none Patient tolerance: Patient tolerated the procedure well with no immediate complications   (including critical care time) Labs Review Labs Reviewed - No data to display  Imaging Review No results found.   MDM   1. Hidradenitis suppurativa   2. Abscess of right groin   Wound care directions reviewed with patient. Sitz baths TID Vicodin and clindamycin as directed Follow up PCP of no improvement over next 2-3 days.     Lutricia Feil, [place] 04/30/15 1950

## 2015-04-30 NOTE — ED Notes (Signed)
Reports 2 boils along the panty-line of right leg .  Reports history of the same.  Reports having had 19 surgeries over the years.  The last boil was 1 1/2 years ago

## 2015-04-30 NOTE — Discharge Instructions (Signed)
Medications as directed. Sitz baths three times a day until healed.  Abscess An abscess is an infected area that contains a collection of pus and debris.It can occur in almost any part of the body. An abscess is also known as a furuncle or boil. CAUSES  An abscess occurs when tissue gets infected. This can occur from blockage of oil or sweat glands, infection of hair follicles, or a minor injury to the skin. As the body tries to fight the infection, pus collects in the area and creates pressure under the skin. This pressure causes pain. People with weakened immune systems have difficulty fighting infections and get certain abscesses more often.  SYMPTOMS Usually an abscess develops on the skin and becomes a painful mass that is red, warm, and tender. If the abscess forms under the skin, you may feel a moveable soft area under the skin. Some abscesses break open (rupture) on their own, but most will continue to get worse without care. The infection can spread deeper into the body and eventually into the bloodstream, causing you to feel ill.  DIAGNOSIS  Your caregiver will take your medical history and perform a physical exam. A sample of fluid may also be taken from the abscess to determine what is causing your infection. TREATMENT  Your caregiver may prescribe antibiotic medicines to fight the infection. However, taking antibiotics alone usually does not cure an abscess. Your caregiver may need to make a small cut (incision) in the abscess to drain the pus. In some cases, gauze is packed into the abscess to reduce pain and to continue draining the area. HOME CARE INSTRUCTIONS   Only take over-the-counter or prescription medicines for pain, discomfort, or fever as directed by your caregiver.  If you were prescribed antibiotics, take them as directed. Finish them even if you start to feel better.  If gauze is used, follow your caregiver's directions for changing the gauze.  To avoid spreading the  infection:  Keep your draining abscess covered with a bandage.  Wash your hands well.  Do not share personal care items, towels, or whirlpools with others.  Avoid skin contact with others.  Keep your skin and clothes clean around the abscess.  Keep all follow-up appointments as directed by your caregiver. SEEK MEDICAL CARE IF:   You have increased pain, swelling, redness, fluid drainage, or bleeding.  You have muscle aches, chills, or a general ill feeling.  You have a fever. MAKE SURE YOU:   Understand these instructions.  Will watch your condition.  Will get help right away if you are not doing well or get worse. Document Released: 09/14/2005 Document Revised: 06/05/2012 Document Reviewed: 02/17/2012 [facility] Patient Information 2015 [place], [place]. This information is not intended to replace advice given to you by your health care provider. Make sure you discuss any questions you have with your health care provider.  Hidradenitis Suppurativa, Sweat Gland Abscess Hidradenitis suppurativa is a long lasting (chronic), uncommon disease of the sweat glands. With this, boil-like lumps and scarring develop in the groin, some times under the arms (axillae), and under the breasts. It may also uncommonly occur behind the ears, in the crease of the buttocks, and around the genitals.  CAUSES  The cause is from a blocking of the sweat glands. They then become infected. It may cause drainage and odor. It is not contagious. So it cannot be given to someone else. It most often shows up in puberty (about 70 to 40 years of age). But it  may happen much later. It is similar to acne which is a disease of the sweat glands. This condition is slightly more common in African-Americans and women. SYMPTOMS   Hidradenitis usually starts as one or more red, tender, swellings in the groin or under the arms (axilla).  Over a period of hours to days the lesions get larger. They often open to the skin  surface, draining clear to yellow-colored fluid.  The infected area heals with scarring. DIAGNOSIS  Your caregiver makes this diagnosis by looking at you. Sometimes cultures (growing germs on plates in the lab) may be taken. This is to see what germ (bacterium) is causing the infection.  TREATMENT   Topical germ killing medicine applied to the skin (antibiotics) are the treatment of choice. Antibiotics taken by mouth (systemic) are sometimes needed when the condition is getting worse or is severe.  Avoid tight-fitting clothing which traps moisture in.  Dirt does not cause hidradenitis and it is not caused by poor hygiene.  Involved areas should be cleaned daily using an antibacterial soap. Some patients find that the liquid form of Lever 2000, applied to the involved areas as a lotion after bathing, can help reduce the odor related to this condition.  Sometimes surgery is needed to drain infected areas or remove scarred tissue. Removal of large amounts of tissue is used only in severe cases.  Birth control pills may be helpful.  Oral retinoids (vitamin A derivatives) for 6 to 12 months which are effective for acne may also help this condition.  Weight loss will improve but not cure hidradenitis. It is made worse by being overweight. But the condition is not caused by being overweight.  This condition is more common in people who have had acne.  It may become worse under stress. There is no medical cure for hidradenitis. It can be controlled, but not cured. The condition usually continues for years with periods of getting worse and getting better (remission). Document Released: 07/19/2004 Document Revised: 02/27/2012 Document Reviewed: 03/07/2014 [facility] Patient Information 2015 [place], [place]. This information is not intended to replace advice given to you by your health care provider. Make sure you discuss any questions you have with your health care provider.  Incision and  Drainage Incision and drainage is a procedure in which a sac-like structure (cystic structure) is opened and drained. The area to be drained usually contains material such as pus, fluid, or blood.  LET YOUR CAREGIVER KNOW ABOUT:   Allergies to medicine.  Medicines taken, including vitamins, herbs, eyedrops, over-the-counter medicines, and creams.  Use of steroids (by mouth or creams).  Previous problems with anesthetics or numbing medicines.  History of bleeding problems or blood clots.  Previous surgery.  Other health problems, including diabetes and kidney problems.  Possibility of pregnancy, if this applies. RISKS AND COMPLICATIONS  Pain.  Bleeding.  Scarring.  Infection. BEFORE THE PROCEDURE  You may need to have an ultrasound or other imaging tests to see how large or deep your cystic structure is. Blood tests may also be used to determine if you have an infection or how severe the infection is. You may need to have a tetanus shot. PROCEDURE  The affected area is cleaned with a cleaning fluid. The cyst area will then be numbed with a medicine (local anesthetic). A small incision will be made in the cystic structure. A syringe or catheter may be used to drain the contents of the cystic structure, or the contents may be squeezed out.  The area will then be flushed with a cleansing solution. After cleansing the area, it is often gently packed with a gauze or another wound dressing. Once it is packed, it will be covered with gauze and tape or some other type of wound dressing. AFTER THE PROCEDURE   Often, you will be allowed to go home right after the procedure.  You may be given antibiotic medicine to prevent or heal an infection.  If the area was packed with gauze or some other wound dressing, you will likely need to come back in 1 to 2 days to get it removed.  The area should heal in about 14 days. Document Released: 05/31/2001 Document Revised: 06/05/2012 Document  Reviewed: 01/30/2012 [facility] Patient Information 2015 [place], [place]. This information is not intended to replace advice given to you by your health care provider. Make sure you discuss any questions you have with your health care provider.

## 2015-05-12 ENCOUNTER — Ambulatory Visit (INDEPENDENT_AMBULATORY_CARE_PROVIDER_SITE_OTHER): Payer: Medicaid Other | Admitting: Family Medicine

## 2015-05-12 DIAGNOSIS — N920 Excessive and frequent menstruation with regular cycle: Secondary | ICD-10-CM | POA: Diagnosis present

## 2015-05-12 DIAGNOSIS — L732 Hidradenitis suppurativa: Secondary | ICD-10-CM | POA: Diagnosis not present

## 2015-05-12 DIAGNOSIS — M545 Low back pain, unspecified: Secondary | ICD-10-CM

## 2015-05-12 DIAGNOSIS — I10 Essential (primary) hypertension: Secondary | ICD-10-CM | POA: Diagnosis not present

## 2015-05-12 DIAGNOSIS — M549 Dorsalgia, unspecified: Secondary | ICD-10-CM | POA: Insufficient documentation

## 2015-05-12 DIAGNOSIS — Z720 Tobacco use: Secondary | ICD-10-CM | POA: Insufficient documentation

## 2015-05-12 MED ORDER — AMLODIPINE BESYLATE 10 MG PO TABS: 10.0000 mg | ORAL_TABLET | Freq: Every day | ORAL | Status: DC

## 2015-05-12 MED ORDER — HYDROCHLOROTHIAZIDE 25 MG PO TABS: 25.0000 mg | ORAL_TABLET | Freq: Every day | ORAL | Status: DC

## 2015-05-12 MED ORDER — MEDROXYPROGESTERONE ACETATE 10 MG PO TABS: 10.0000 mg | ORAL_TABLET | Freq: Every day | ORAL | Status: DC

## 2015-05-12 NOTE — Assessment & Plan Note (Addendum)
Recent abscess drained.  Currently it is a open wound with minimal erythema and no current drainage. Patient has several sinus tracts noted and will likely have continued issues. Advised that surgical referral is indicated and she declined as she does not want this. Patient became tearful when discussing this. No indications for antibiotics at this time.

## 2015-05-12 NOTE — Progress Notes (Signed)
   Subjective:    Patient ID: Rachel Mckinney, female    DOB: Feb 03, 1976, 39 y.o.   MRN: [id number]  HPI 39 year old female with a history of hidradenitis, tobacco abuse, HTN and morbid obesity presents with several concerns today. See below.  1) Menorrhagia  Patient reports that she had heavy menstrual bleeding in December as well as February.  She reports her cycle at that time was quite heavy requiring 6 pads a day. She also reports that she was passing small to moderate size clots.  She states that she was treated with Provera and had resolution of her bleeding.  She reports that her cycle just started and she is concerned that she may have heavy bleeding with this cycle.  She has never had this worked up.  No prior imaging studies.   2) HTN  Uncontrolled.  Patient reports that she has had long-standing hypertension. She was recently started on treatment with HCTZ.  However, she reports that she has been out of her medication for at least one month.  Her blood pressures currently elevated but she is a symptomatic.  ROS: Denies chest pain, SOB, lightheadedness/dizziness   3) Back pain  Patient reports a 1.5 month history of low back pain.  Pain is diffuse in her low back.  No known aggravating factors.  She's been taking Aleve with some relief in her pain.  No associated numbness or tingling in the lower extremity.  No dysuria, urinary frequency, urinary urgency. No reports of incontinence.  4) Hidradenitis  This is a chronic issue for the patient.  She was recently seen in urgent care for a abscess in her perineal/right inguinal region.  Abscess was drained and patient was placed on the buttocks.  She states it appears to be healing okay but she would like this looked at today.  No recent fevers or chills. She does report some drainage from the area.  Of note, she has long-standing history of hidradenitis and has required surgery in the past.  Social Hx -  Current smoker.   Review of Systems  Constitutional: Negative for fever and chills.  Respiratory: Negative for shortness of breath.   Cardiovascular: Negative for chest pain.  Musculoskeletal: Positive for back pain.  Skin: Positive for wound.      Objective:   Physical Exam Filed Vitals:   05/12/15 1358  BP: 176/92  Pulse: 78   Vital signs reviewed.  Exam: General: well appearing obese female in no acute distress. Cardiovascular: RRR. No murmurs, rubs, or gallops. Respiratory: CTAB. No rales, rhonchi, or wheeze. Abdomen: obese, soft, nontender, nondistended. Skin: Perineal/Groin region - prior scarring from surgery noted. Patient with two open wounds noted in the right inguinal crease.  Sinus tracts noted.      Assessment & Plan:  See Problem List  30 minutes were spent face-to-face with the patient during this encounter and over half of that time was spent on counseling and coordination of care.

## 2015-05-12 NOTE — Assessment & Plan Note (Addendum)
Uncontrolled. BP elevated today and patient out of medication. Patient informed me that she would like to have another child and is trying to get pregnant. Will treat BP with Norvasc (discussed this with Pharmacist Dr. Valentina Lucks in setting of above) and HCTZ. Follow up in 2 weeks to 1 month.

## 2015-05-12 NOTE — Assessment & Plan Note (Signed)
Patient inquired about weight loss medications. I informed her that these medications help jump start weight loss but are not long term and are also not effective long term if there are no significant changes in lifestyle/diet/exercise. I offered referral to nutrition and she declined. Encouraged diet and exercise.

## 2015-05-12 NOTE — Assessment & Plan Note (Signed)
Appears MSK in origin. No red flags on history/exam. Advised tylenol and PRN NSAID's. Advised cessation of NSAID's if going to try to conceive.

## 2015-05-12 NOTE — Patient Instructions (Signed)
It was nice to see you today.  Below is what we discussed.  Heavy bleeding - Use the provera if needed - We need to schedule an ultrasound to work this up.  Hypertension - Continue the HCTZ - I am adding Norvasc today. - Follow up in ~ 2 weeks - 1 month for re-check  Back pain - Exercise regularly - Use Tylenol (and/or NSAID's) as needed.  Stop NSAID's prior to attempting to have a child.  Take care  Dr. Lacinda Axon

## 2015-05-12 NOTE — Assessment & Plan Note (Signed)
Prior menorrhagia in December and February. Obtaining Transvaginal US to assess for underlying pathology. Patient just started menstrual cycle and is concerned that she is going to bleed heavily. Rx for Provera given if bleeding becomes heavy.

## 2015-05-13 NOTE — Progress Notes (Signed)
I was the preceptor on the day of this visit.   Marigold Mom MD  

## 2015-05-15 NOTE — Addendum Note (Signed)
Addended by: Coral Spikes on: 05/15/2015 12:15 PM   Modules accepted: Orders

## 2015-05-22 ENCOUNTER — Ambulatory Visit (HOSPITAL_COMMUNITY)
Admission: RE | Admit: 2015-05-22 | Discharge: 2015-05-22 | Disposition: A | Discharge status: Home / Self Care | Payer: Medicaid Other | Source: Ambulatory Visit | Attending: Family Medicine | Admitting: Family Medicine

## 2015-05-22 DIAGNOSIS — N920 Excessive and frequent menstruation with regular cycle: Secondary | ICD-10-CM | POA: Diagnosis not present

## 2015-05-22 DIAGNOSIS — D259 Leiomyoma of uterus, unspecified: Secondary | ICD-10-CM | POA: Insufficient documentation

## 2015-05-25 ENCOUNTER — Encounter: Payer: Self-pay | Admitting: Family Medicine

## 2015-06-17 ENCOUNTER — Other Ambulatory Visit (HOSPITAL_COMMUNITY)
Admission: RE | Admit: 2015-06-17 | Discharge: 2015-06-17 | Disposition: A | Discharge status: Home / Self Care | Payer: Medicaid Other | Source: Ambulatory Visit | Attending: Family Medicine | Admitting: Family Medicine

## 2015-06-17 ENCOUNTER — Ambulatory Visit (INDEPENDENT_AMBULATORY_CARE_PROVIDER_SITE_OTHER): Payer: Medicaid Other | Admitting: Family Medicine

## 2015-06-17 VITALS — BP 134/83 | HR 84 | Temp 98.0°F | Wt 249.0 lb

## 2015-06-17 DIAGNOSIS — Z113 Encounter for screening for infections with a predominantly sexual mode of transmission: Secondary | ICD-10-CM | POA: Diagnosis not present

## 2015-06-17 DIAGNOSIS — N920 Excessive and frequent menstruation with regular cycle: Secondary | ICD-10-CM | POA: Diagnosis not present

## 2015-06-17 DIAGNOSIS — L732 Hidradenitis suppurativa: Secondary | ICD-10-CM | POA: Diagnosis not present

## 2015-06-17 DIAGNOSIS — Z01419 Encounter for gynecological examination (general) (routine) without abnormal findings: Secondary | ICD-10-CM | POA: Insufficient documentation

## 2015-06-17 DIAGNOSIS — Z0000 Encounter for general adult medical examination without abnormal findings: Secondary | ICD-10-CM

## 2015-06-17 DIAGNOSIS — I10 Essential (primary) hypertension: Secondary | ICD-10-CM

## 2015-06-17 LAB — POCT GLYCOSYLATED HEMOGLOBIN (HGB A1C): Hemoglobin A1C: 5.1

## 2015-06-17 MED ORDER — CHLORHEXIDINE GLUCONATE 4 % EX LIQD: Freq: Every day | CUTANEOUS | Status: DC | PRN

## 2015-06-17 MED ORDER — CLOTRIMAZOLE 1 % EX OINT: TOPICAL_OINTMENT | CUTANEOUS | Status: DC

## 2015-06-17 NOTE — Patient Instructions (Addendum)
It was nice to see you today.   Continue your medications as prescribed.   Follow up at least annually.  Be sure to exercise and eat healthy.  Be sure to get your mammogram.  Take care  Dr. Lacinda Axon

## 2015-06-17 NOTE — Assessment & Plan Note (Signed)
Discussed the risks and benefits of "weight loss pills". Encouraged healthy diet and exercise (patient admits to poor diet and lack of exercise). Also discussed (briefly) option of bariatric surgery. Patient will try to eat healthier and exercise.

## 2015-06-17 NOTE — Progress Notes (Signed)
   Subjective:   Rachel Mckinney is a 39 y.o. female presents for an annual exam.   Gyn concerns/Preventative healthcare  Regular periods: Yes.   Heavy bleeding: Yes; Recent US revealed fibroids.   Sexually active: Yes.   Contraception or hormonal therapy: Currently using condoms.   Patient desires STD screening: Yes.   Vaginal discharge: No.  Last mammogram: Has not started screening.   Breast mass or concerns: Patient does have hidradenitis which affects left breast.   Last pap smear: 06/2011.   HTN  Well controlled.   Medications - HCTZ 25 mg daily.    Compliance -  I previously added Norvasc but patient is only taking above medication.   ROS: Denies chest pain, SOB, lightheadedness/dizziness   Morbid obesity  Patient requesting weight loss medication today.  Hidradenitis  Continues to be an chronic problem.  She intermittently has purulent drainage from abscesses (pelvic region, left breast).   No recent fever, chills, nausea, vomiting.  PMH, Surgical Hx, Family Hx, Social History reviewed and updated as below.  Past Medical History  Diagnosis Date  . Boils     under breast and inner thigh  . Hidradenitis suppurativa     Chronic problem for patient, multiple admissions for surgical drainage  . Hypertension    Past Surgical History  Procedure Laterality Date  . Multiple abscess drainages    . Cesarean section     Family History  Problem Relation Age of Onset  . Liver disease Father   . Hypertension Father   . Hypertension Sister    History   Social History  . Marital Status: Single    Spouse Name: N/A  . Number of Children: N/A  . Years of Education: N/A   Social History Main Topics  . Smoking status: Current Every Day Smoker -- 0.30 packs/day    Types: Cigarettes  . Smokeless tobacco: Not on file  . Alcohol Use: Yes     Comment: on weekends  . Drug Use: Yes    Special: Marijuana     Comment: occas use of marijuana  . Sexual  Activity: Not on file   Other Topics Concern  . Not on file   Social History Narrative   Review of Systems:  Per HPI. Otherwise a complete 10 point ROS was negative.   Objective:   Filed Vitals:   06/17/15 1540  BP: 134/83  Pulse: 84  Temp: 98 F (36.7 C)   Exam: General: well appearing obese female in NAD.  HEENT: NCAT. Normal TM's bilaterally. Oropharynx clear.  Neck: Supple. No adenopathy.  Cardiovascular: RRR. No murmurs, rubs, or gallops. Respiratory: CTAB. No rales, rhonchi, or wheeze. Abdomen: soft, nontender, nondistended. No organomegaly.  Extremities: warm, well perfused. No LE edema. Skin: Scarring noted from prior skin surgeries (axilla, pelvis). Pelvic Exam:        External: normal female genitalia without lesions or masses        Vagina: normal without lesions or masses        Cervix: normal without lesions or masses        Pap smear: performed        Samples for GC/Chlamydia obtained Breasts: breasts appear normal, no appreciable masses,no nipple changes or axillary nodes.  Assessment/Plan:  See Problem List

## 2015-06-17 NOTE — Assessment & Plan Note (Signed)
Chronic. Patient do okay at this time. Advised patient that surgery is only definitive treatment for this.  She does not want surgery/referral at this time.

## 2015-06-17 NOTE — Assessment & Plan Note (Signed)
Discussed treatment options today. Patient is undecided.  New PCP to follow up regarding this.

## 2015-06-17 NOTE — Assessment & Plan Note (Signed)
Stable. Well controlled. Continue HCTZ.

## 2015-06-17 NOTE — Assessment & Plan Note (Addendum)
Pap smear performed today. GC/Chlamydia, HIV, RPR, BMP, Lipid panel, A1C done today. Discussed guidelines regarding breast cancer screening.  Patient to get mammogram this year.

## 2015-06-18 ENCOUNTER — Encounter: Payer: Self-pay | Admitting: Family Medicine

## 2015-06-18 LAB — LIPID PANEL
CHOLESTEROL: 163 mg/dL (ref 0–200)
HDL: 47 mg/dL (ref >=46)
LDL Cholesterol: 102 mg/dL — ABNORMAL HIGH (ref 0–99)
Total CHOL/HDL Ratio: 3.5 Ratio
Triglycerides: 72 mg/dL (ref <150)
VLDL: 14 mg/dL (ref 0–40)

## 2015-06-18 LAB — BASIC METABOLIC PANEL WITH GFR
BUN: 13 mg/dL (ref 6–23)
CHLORIDE: 105 meq/L (ref 96–112)
CO2: 26 mEq/L (ref 19–32)
CREATININE: 0.79 mg/dL (ref 0.50–1.10)
Calcium: 8.9 mg/dL (ref 8.4–10.5)
GFR, Est African American: >89 mL/min
Glucose, Bld: 79 mg/dL (ref 70–99)
Potassium: 4.5 mEq/L (ref 3.5–5.3)
SODIUM: 139 meq/L (ref 135–145)

## 2015-06-18 LAB — HIV ANTIBODY (ROUTINE TESTING W REFLEX): HIV 1&2 Ab, 4th Generation: NONREACTIVE

## 2015-06-18 LAB — RPR

## 2015-06-19 LAB — CYTOLOGY - PAP

## 2015-06-19 LAB — CERVICOVAGINAL ANCILLARY ONLY
CHLAMYDIA, DNA PROBE: NEGATIVE
Neisseria Gonorrhea: NEGATIVE

## 2015-06-23 ENCOUNTER — Telehealth: Payer: Self-pay | Admitting: *Deleted

## 2015-06-23 NOTE — Telephone Encounter (Signed)
-----   Message from Coral Spikes, DO sent at 06/19/2015  5:52 PM EDT ----- Please inform of negative STD testing.

## 2015-06-23 NOTE — Telephone Encounter (Signed)
Tried to contact pt to inform of below but her phone was not accepting calls at this time.  Katharina Caper, [name], [place]

## 2015-06-26 ENCOUNTER — Telehealth: Payer: Self-pay | Admitting: Family Medicine

## 2015-06-26 MED ORDER — METRONIDAZOLE 500 MG PO TABS: 2000.0000 mg | ORAL_TABLET | Freq: Once | ORAL | Status: DC

## 2015-06-26 NOTE — Telephone Encounter (Signed)
Patient should be seen for bleeding before another course of provera is started.  She likely needs an IUD or nexplanon for continued management of bleeding.  Please have patient schedule appt with me or someone else.  Virginia Crews, MD, MPH PGY-2,  [facility] 06/26/2015 8:39 PM

## 2015-06-26 NOTE — Telephone Encounter (Signed)
Patient's STD testing negative and pap smear normal, but trichomonas present on pap smear.  Will treat with Flagyl 2g x1.  Patient should inform all sexual partners to be tested and treated as well.  Virginia Crews, MD, MPH PGY-2,  [facility] 06/26/2015 8:40 PM

## 2015-06-26 NOTE — Telephone Encounter (Signed)
Pt says she was diagnosed with fibroids and was given an rx provera to stop the bleeding, she is bleeding again and wants to know if she can get a refill. Pt supposed to be deciding on a birth control to help but she has not decided yet. Pt goes to [facility]/[address].

## 2015-06-26 NOTE — Telephone Encounter (Signed)
2nd attempt to reach pt to inform of below, LVM. Katharina Caper, Calypso Hagarty D, [place]

## 2015-06-29 NOTE — Telephone Encounter (Signed)
Patient informed, expressed understanding. 

## 2015-10-19 ENCOUNTER — Ambulatory Visit: Payer: Medicaid Other | Admitting: Family Medicine

## 2016-01-26 ENCOUNTER — Encounter: Payer: Self-pay | Admitting: Family Medicine

## 2016-01-26 ENCOUNTER — Ambulatory Visit (INDEPENDENT_AMBULATORY_CARE_PROVIDER_SITE_OTHER): Payer: Medicaid Other | Admitting: Family Medicine

## 2016-01-26 VITALS — BP 152/90 | HR 95 | Temp 98.3°F | Ht 64.0 in | Wt 247.3 lb

## 2016-01-26 DIAGNOSIS — B372 Candidiasis of skin and nail: Secondary | ICD-10-CM | POA: Insufficient documentation

## 2016-01-26 DIAGNOSIS — G4452 New daily persistent headache (NDPH): Secondary | ICD-10-CM

## 2016-01-26 DIAGNOSIS — R519 Headache, unspecified: Secondary | ICD-10-CM | POA: Insufficient documentation

## 2016-01-26 DIAGNOSIS — Z720 Tobacco use: Secondary | ICD-10-CM

## 2016-01-26 DIAGNOSIS — L732 Hidradenitis suppurativa: Secondary | ICD-10-CM | POA: Diagnosis not present

## 2016-01-26 DIAGNOSIS — I10 Essential (primary) hypertension: Secondary | ICD-10-CM

## 2016-01-26 DIAGNOSIS — R51 Headache: Secondary | ICD-10-CM

## 2016-01-26 MED ORDER — NYSTATIN 100000 UNIT/GM EX CREA: 1.0000 "application " | TOPICAL_CREAM | Freq: Two times a day (BID) | CUTANEOUS | Status: DC

## 2016-01-26 MED ORDER — HYDROCHLOROTHIAZIDE 25 MG PO TABS: 25.0000 mg | ORAL_TABLET | Freq: Every day | ORAL | Status: DC

## 2016-01-26 NOTE — Assessment & Plan Note (Signed)
Characteristic rash noted under bilateral breasts Start nystatin cream twice daily Advised patient to keep area clean and dry

## 2016-01-26 NOTE — Assessment & Plan Note (Signed)
Chronic issue for the patient but is stable at this time Advised patient that surgery is only definitive treatment for this, but she does not want surgery or referral at this time

## 2016-01-26 NOTE — Assessment & Plan Note (Signed)
Discussed the risk and benefits of weight loss pills Encouraged healthy diet and exercise - she admits to poor diet and lack of exercise F/u at next visit

## 2016-01-26 NOTE — Assessment & Plan Note (Signed)
Briefly discussed willingness to quit Due to current stress level, patient pre-contemplative

## 2016-01-26 NOTE — Assessment & Plan Note (Signed)
Currently poorly controlled, the patient has been out of her medications for several days Resume HCTZ 25 mg daily Follow-up in 3-4 weeks

## 2016-01-26 NOTE — Progress Notes (Signed)
Subjective:   Rachel Mckinney is a 40 y.o. female with a history of HTN, hidradenitis, morbid obesity, tobacco abuse here for headaches, hypertension, hidradenitis/rash.  headaches  - doesn't usually get headaches - last 2 weeks has had frequent intermittent headaches - always in different spots of head - has to lay down, photophobia, phonophobia - ran out of antihypertensive 3-4 days ago - no N/V, neck pain - has not tried any medications - has been under a lot of stress 2/2 best friend passing away yesterday - usually go away with sleep - nothing makes them worse - not impacting function, no numbness, weakness, changes in vision  HTN: - Medications: HCTZ 25mg  daily - Compliance: takes 3 x/ week - Checking BP at home: no - Denies any SOB, CP, vision changes, LE edema, medication SEs, or symptoms of hypotension - Diet: has not changed diet, eats high sodium - Exercise: walking around to get around  Hidradenitis - little red bumps that pop up on stomach and armpits - also had yeast under breast - used nystatin cream, no better though - bumps raise and drain - has had surgeries in the past at [facility] - was using hibiclens, but thinks that it wasn't helping  Morbid obesity - was talking to friend who was prescribed weight loss pills - reports that diet and exercise dont work for her  Review of Systems:  Per HPI. All other systems reviewed and are negative.   PMH, PSH, Medications, Allergies, and FmHx reviewed and updated in EMR.  Social History: current smoker - 1 pack per week for 6 years - not interested in cutting back or quitting  Objective:  BP 152/90 mmHg  Pulse 95  Temp(Src) 98.3 F (36.8 C) (Oral)  Ht 5\' 4"  (1.626 m)  Wt 247 lb 4.8 oz (112.175 kg)  BMI 42.43 kg/m2  Gen:  40 y.o. female in NAD, obese HEENT: NCAT, MMM, EOMI, PERRL, anicteric sclerae, OP clear CV: RRR, no MRG, no JVD, intact distal pulses Resp: Non-labored, CTAB, no wheezes noted Abd:  Soft, NTND, BS present, no guarding or organomegaly Ext: WWP, no edema MSK: Full ROM, strength intact Neuro: Alert and oriented, speech normal, sensation intact to light touch, CN2-12 intact, FNF intact, gait intact Skin: Prior scarring from multiple episodes of hidradenitis noted, erythema under bilateral breasts noted, no open or draining wounds currently    Assessment & Plan:     Rachel Mckinney is a 40 y.o. female here for   HTN (hypertension) Currently poorly controlled, the patient has been out of her medications for several days Resume HCTZ 25 mg daily Follow-up in 3-4 weeks  Hidradenitis suppurativa-chronic and involving multiple sites Chronic issue for the patient but is stable at this time Advised patient that surgery is only definitive treatment for this, but she does not want surgery or referral at this time  Morbid obesity Discussed the risk and benefits of weight loss pills Encouraged healthy diet and exercise - she admits to poor diet and lack of exercise F/u at next visit  Tobacco abuse Briefly discussed willingness to quit Due to current stress level, patient pre-contemplative  Intertriginous candidiasis Characteristic rash noted under bilateral breasts Start nystatin cream twice daily Advised patient to keep area clean and dry  Headache New onset of headaches in the setting of high stress Neuro exam normal No red flags Suspect mixed tension and migraine type Advised patient to try NSAIDs as needed for headaches Advised stress reduction as possible Advised  regular eating habits and lack of caffeine and alcohol intake Return precautions discussed Follow-up in 3-4 weeks       Virginia Crews, MD MPH PGY-2,  [facility] 01/26/2016  3:24 PM

## 2016-01-26 NOTE — Patient Instructions (Signed)
Nice to meet you today. Try ibuprofen or Aleve for headache when it comes on. Resume taking your blood pressure pill. Try to remember to take this every day. Use the nystatin cream twice daily under breasts for a yeast infection. Use it until it is completely cleared up.  Take care, Dr. Jacinto Reap  General Headache A headache is pain or discomfort felt around the head or neck area. The specific cause of a headache may not be found. There are many causes and types of headaches. A few common ones are:  Tension headaches.  Migraine headaches.  Cluster headaches.  Chronic daily headaches. HOME CARE INSTRUCTIONS  Watch your condition for any changes. Take these steps to help with your condition: Managing Pain  Take over-the-counter and prescription medicines only as told by your health care provider.  Lie down in a dark, quiet room when you have a headache.  If directed, apply ice to the head and neck area:  Put ice in a plastic bag.  Place a towel between your skin and the bag.  Leave the ice on for 20 minutes, 2-3 times per day.  Use a heating pad or hot shower to apply heat to the head and neck area as told by your health care provider.  Keep lights dim if bright lights bother you or make your headaches worse. Eating and Drinking  Eat meals on a regular schedule.  Limit alcohol use.  Decrease the amount of caffeine you drink, or stop drinking caffeine. General Instructions  Keep all follow-up visits as told by your health care provider. This is important.  Keep a headache journal to help find out what may trigger your headaches. For example, write down:  What you eat and drink.  How much sleep you get.  Any change to your diet or medicines.  Try massage or other relaxation techniques.  Limit stress.  Sit up straight, and do not tense your muscles.  Do not use tobacco products, including cigarettes, chewing tobacco, or e-cigarettes. If you need help quitting, ask your  health care provider.  Exercise regularly as told by your health care provider.  Sleep on a regular schedule. Get 7-9 hours of sleep, or the amount recommended by your health care provider. SEEK MEDICAL CARE IF:   Your symptoms are not helped by medicine.  You have a headache that is different from the usual headache.  You have nausea or you vomit.  You have a fever. SEEK IMMEDIATE MEDICAL CARE IF:   Your headache becomes severe.  You have repeated vomiting.  You have a stiff neck.  You have a loss of vision.  You have problems with speech.  You have pain in the eye or ear.  You have muscular weakness or loss of muscle control.  You lose your balance or have trouble walking.  You feel faint or pass out.  You have confusion.   This information is not intended to replace advice given to you by your health care provider. Make sure you discuss any questions you have with your health care provider.   Document Released: 12/05/2005 Document Revised: 08/26/2015 Document Reviewed: 03/30/2015 Elsevier Interactive Patient Education [employer].

## 2016-01-26 NOTE — Assessment & Plan Note (Signed)
New onset of headaches in the setting of high stress Neuro exam normal No red flags Suspect mixed tension and migraine type Advised patient to try NSAIDs as needed for headaches Advised stress reduction as possible Advised regular eating habits and lack of caffeine and alcohol intake Return precautions discussed Follow-up in 3-4 weeks

## 2016-03-02 ENCOUNTER — Encounter: Payer: Self-pay | Admitting: Family Medicine

## 2016-03-02 ENCOUNTER — Ambulatory Visit (INDEPENDENT_AMBULATORY_CARE_PROVIDER_SITE_OTHER): Payer: Medicaid Other | Admitting: Family Medicine

## 2016-03-02 ENCOUNTER — Other Ambulatory Visit (HOSPITAL_COMMUNITY)
Admission: RE | Admit: 2016-03-02 | Discharge: 2016-03-02 | Disposition: A | Discharge status: Home / Self Care | Payer: Medicaid Other | Source: Ambulatory Visit | Attending: Family Medicine | Admitting: Family Medicine

## 2016-03-02 VITALS — BP 155/89 | HR 90 | Temp 97.8°F | Wt 251.0 lb

## 2016-03-02 DIAGNOSIS — N939 Abnormal uterine and vaginal bleeding, unspecified: Secondary | ICD-10-CM

## 2016-03-02 DIAGNOSIS — Z113 Encounter for screening for infections with a predominantly sexual mode of transmission: Secondary | ICD-10-CM | POA: Diagnosis present

## 2016-03-02 DIAGNOSIS — A5901 Trichomonal vulvovaginitis: Secondary | ICD-10-CM | POA: Insufficient documentation

## 2016-03-02 LAB — POCT WET PREP (WET MOUNT): Clue Cells Wet Prep Whiff POC: POSITIVE

## 2016-03-02 LAB — TSH: TSH: 2.1 m[IU]/L

## 2016-03-02 LAB — POCT URINE PREGNANCY: PREG TEST UR: NEGATIVE

## 2016-03-02 LAB — POCT HEMOGLOBIN: HEMOGLOBIN: 11.4 g/dL — AB (ref 12.2–16.2)

## 2016-03-02 MED ORDER — MEDROXYPROGESTERONE ACETATE 10 MG PO TABS: 10.0000 mg | ORAL_TABLET | Freq: Every day | ORAL | Status: DC

## 2016-03-02 MED ORDER — FERROUS SULFATE 325 (65 FE) MG PO TABS: 325.0000 mg | ORAL_TABLET | Freq: Three times a day (TID) | ORAL | Status: DC

## 2016-03-02 MED ORDER — METRONIDAZOLE 500 MG PO TABS: 500.0000 mg | ORAL_TABLET | Freq: Two times a day (BID) | ORAL | Status: DC

## 2016-03-02 NOTE — Patient Instructions (Signed)

## 2016-03-02 NOTE — Assessment & Plan Note (Signed)
Patient treated for trichomonas in 05/2015, but she reports she did not have her partner treated at that time Trichomonas noted on wet prep today Treat with Flagyl 500 mg twice a day for 7 days as her also clue cells present Advised patient to have her partner treated and have protected sex until both of them are treated F/u GC/CT

## 2016-03-02 NOTE — Assessment & Plan Note (Addendum)
Likely related to fibroids as this has been the cause in the past No polyps or other etiology noted on GYN exam Provera 10mg  daily x10 days to stabilize endometrium Check TSH and Hgb today Check UPreg F/u prn

## 2016-03-02 NOTE — Addendum Note (Signed)
Addended by: Virginia Crews on: 03/02/2016 09:30 AM   Modules accepted: Orders

## 2016-03-02 NOTE — Progress Notes (Signed)
   Subjective:   KARENA HAMRIC is a 40 y.o. female with a history of HTN, menorrhagia here for same day appt for vaginal bleeding.  vaginal bleeding - 1 month of bleeding - Has had this before - took provera and it worked last time - Has known fibroids - Is thinking about OCPs for management of bleeding - is considering having a baby - Bleeding will lighten up and not soak a pad, rarely fluctuates heavier and will soak a pad - Not feeling weak or pale - Doesn't take iron supplement - Feeling tired - more so than previously - sexually active with 1 female partner for >6 months  Review of Systems:  Per HPI.   Social History: current smoker  Objective:  BP 155/89 mmHg  Pulse 90  Temp(Src) 97.8 F (36.6 C) (Oral)  Wt 251 lb (113.853 kg)  LMP 03/02/2016  Gen:  40 y.o. female in NAD HEENT: NCAT, MMM, anicteric sclerae, no conjunctival pallor CV: RRR, no MRG Resp: Non-labored, CTAB, no wheezes noted Abd: Soft, NTND, BS present, no guarding or organomegaly GYN:  External genitalia within normal limits.  Vaginal mucosa pink, moist, normal rugae.  Nonfriable cervix without lesions, no discharge noted on speculum exam.  Mild bleeding noted. Bimanual exam revealed slightly enlarged uterus.  No cervical motion tenderness. No adnexal masses bilaterally.  Ext: WWP, no edema Neuro: Alert and oriented, speech normal    Assessment & Plan:     CELIA HERMAN is a 40 y.o. female here for the following:  Abnormal uterine bleeding Likely related to fibroids as this has been the cause in the past No polyps or other etiology noted on GYN exam Provera 10mg  daily x10 days to stabilize endometrium Check TSH and Hgb today Check UPreg F/u prn  Trichomonal vaginitis Patient treated for trichomonas in 05/2015, but she reports she did not have her partner treated at that time Trichomonas noted on wet prep today Treat with Flagyl 500 mg twice a day for 7 days as her also clue cells  present Advised patient to have her partner treated and have protected sex until both of them are treated F/u GC/CT     Virginia Crews, MD MPH PGY-2,  [facility] 03/02/2016  9:20 AM

## 2016-03-03 ENCOUNTER — Encounter: Payer: Self-pay | Admitting: Family Medicine

## 2016-03-03 LAB — CERVICOVAGINAL ANCILLARY ONLY
CHLAMYDIA, DNA PROBE: NEGATIVE
Neisseria Gonorrhea: NEGATIVE

## 2016-03-04 ENCOUNTER — Telehealth: Payer: Self-pay | Admitting: *Deleted

## 2016-03-04 NOTE — Telephone Encounter (Signed)
Patient informed, expressed understanding. 

## 2016-03-04 NOTE — Telephone Encounter (Signed)
-----   Message from Virginia Crews, MD sent at 03/04/2016  8:36 AM EDT ----- Please let patient know that GC/CT negative.  Thanks!  Virginia Crews, MD, MPH PGY-2,  [facility] 03/04/2016 8:35 AM

## 2016-03-04 NOTE — Telephone Encounter (Signed)
Left message on voicemail for patient to call back. 

## 2016-05-24 ENCOUNTER — Other Ambulatory Visit (HOSPITAL_COMMUNITY)
Admission: RE | Admit: 2016-05-24 | Discharge: 2016-05-24 | Disposition: A | Discharge status: Home / Self Care | Payer: Medicaid Other | Source: Ambulatory Visit | Attending: Family Medicine | Admitting: Family Medicine

## 2016-05-24 ENCOUNTER — Encounter: Payer: Self-pay | Admitting: Family Medicine

## 2016-05-24 ENCOUNTER — Telehealth: Payer: Self-pay | Admitting: Family Medicine

## 2016-05-24 ENCOUNTER — Ambulatory Visit (INDEPENDENT_AMBULATORY_CARE_PROVIDER_SITE_OTHER): Payer: Medicaid Other | Admitting: Family Medicine

## 2016-05-24 VITALS — BP 156/93 | HR 78 | Temp 98.0°F | Ht 64.0 in | Wt 250.0 lb

## 2016-05-24 DIAGNOSIS — A5901 Trichomonal vulvovaginitis: Secondary | ICD-10-CM

## 2016-05-24 DIAGNOSIS — Z202 Contact with and (suspected) exposure to infections with a predominantly sexual mode of transmission: Secondary | ICD-10-CM | POA: Diagnosis not present

## 2016-05-24 DIAGNOSIS — Z20828 Contact with and (suspected) exposure to other viral communicable diseases: Secondary | ICD-10-CM | POA: Diagnosis not present

## 2016-05-24 DIAGNOSIS — L732 Hidradenitis suppurativa: Secondary | ICD-10-CM

## 2016-05-24 DIAGNOSIS — Z113 Encounter for screening for infections with a predominantly sexual mode of transmission: Secondary | ICD-10-CM | POA: Insufficient documentation

## 2016-05-24 LAB — POCT WET PREP (WET MOUNT): Clue Cells Wet Prep Whiff POC: POSITIVE

## 2016-05-24 MED ORDER — METRONIDAZOLE 500 MG PO TABS: 500.0000 mg | ORAL_TABLET | Freq: Two times a day (BID) | ORAL | Status: DC

## 2016-05-24 NOTE — Telephone Encounter (Signed)
Called patient to inform her of wet prep results.  +trich and BV.  Treat with flagyl 500mg  BID x14d. Have partner treated as well.  Virginia Crews, MD, MPH PGY-2,  [facility] 05/24/2016 4:59 PM

## 2016-05-24 NOTE — Assessment & Plan Note (Signed)
Has tried multiple treatments in the past including almost 19 surgeries No active infection at this time Referral to general surgery at [facility] for follow-up from previous surgeries and discussion of further surgical intervention

## 2016-05-24 NOTE — Assessment & Plan Note (Signed)
Wet prep, GC/CT, HIV, RPR collected today Discussed safe sex practices Follow-up as needed

## 2016-05-24 NOTE — Patient Instructions (Signed)
Trichomoniasis Trichomoniasis is an infection caused by an organism called Trichomonas. The infection can affect both women and men. In women, the outer female genitalia and the vagina are affected. In men, the penis is mainly affected, but the prostate and other reproductive organs can also be involved. Trichomoniasis is a sexually transmitted infection (STI) and is most often passed to another person through sexual contact.  RISK FACTORS  Having unprotected sexual intercourse.  Having sexual intercourse with an infected partner. SIGNS AND SYMPTOMS  Symptoms of trichomoniasis in women include:  Abnormal gray-green frothy vaginal discharge.  Itching and irritation of the vagina.  Itching and irritation of the area outside the vagina. Symptoms of trichomoniasis in men include:   Penile discharge with or without pain.  Pain during urination. This results from inflammation of the urethra. DIAGNOSIS  Trichomoniasis may be found during a Pap test or physical exam. Your health care provider may use one of the following methods to help diagnose this infection:  Testing the pH of the vagina with a test tape.  Using a vaginal swab test that checks for the Trichomonas organism. A test is available that provides results within a few minutes.  Examining a urine sample.  Testing vaginal secretions. Your health care provider may test you for other STIs, including HIV. TREATMENT   You may be given medicine to fight the infection. Women should inform their health care provider if they could be or are pregnant. Some medicines used to treat the infection should not be taken during pregnancy.  Your health care provider may recommend over-the-counter medicines or creams to decrease itching or irritation.  Your sexual partner will need to be treated if infected.  Your health care provider may test you for infection again 3 months after treatment. HOME CARE INSTRUCTIONS   Take medicines only as  directed by your health care provider.  Take over-the-counter medicine for itching or irritation as directed by your health care provider.  Do not have sexual intercourse while you have the infection.  Women should not douche or wear tampons while they have the infection.  Discuss your infection with your partner. Your partner may have gotten the infection from you, or you may have gotten it from your partner.  Have your sex partner get examined and treated if necessary.  Practice safe, informed, and protected sex.  See your health care provider for other STI testing. SEEK MEDICAL CARE IF:   You still have symptoms after you finish your medicine.  You develop abdominal pain.  You have pain when you urinate.  You have bleeding after sexual intercourse.  You develop a rash.  Your medicine makes you sick or makes you throw up (vomit). MAKE SURE YOU:  Understand these instructions.  Will watch your condition.  Will get help right away if you are not doing well or get worse.   This information is not intended to replace advice given to you by your health care provider. Make sure you discuss any questions you have with your health care provider.   Document Released: 05/31/2001 Document Revised: 12/26/2014 Document Reviewed: 09/16/2013 Elsevier Interactive Patient Education 2016 Elsevier Inc.  

## 2016-05-24 NOTE — Progress Notes (Signed)
   Subjective:   Rachel Mckinney is a 40 y.o. female with a history of HTN, hidradenitis, Trichomonas vaginitis here for boils  H/o trichomonas - fishy smell present again x1 wk - hasnt noticed much vaginal discharge - thinks that her partner (1 female, no condoms) may have been lying about getting treated - wants to be tested for all STDs today  Hidradenitis - Reports she's had multiple surgeries in the past at [facility] to have draining wounds in her right groin and under her left breast - Other boils come up and drain - Was previously using Hibiclens, but now currently using Dial - Has seen dermatology in the past without any success in treatment Would like to see surgery again to discuss surgical options for management-   Review of Systems:  Per HPI.   Social History: [place]  Objective:  BP 156/93 mmHg  Pulse 78  Temp(Src) 98 F (36.7 C) (Oral)  Ht 5\' 4"  (1.626 m)  Wt 250 lb (113.399 kg)  BMI 42.89 kg/m2  LMP 06/04/2015  Gen:  40 y.o. female in NAD HEENT: NCAT, MMM Abd: Soft, NTND, BS present, no guarding or organomegaly Ext: WWP, no edema Skin: multiple indurated areas, draining wounds under L breast and in R groin, no signs of current infection MSK: No obvious deformities, gait intact Neuro: Alert and oriented, speech normal    Assessment & Plan:     Rachel Mckinney is a 40 y.o. female here for   Hidradenitis suppurativa-chronic and involving multiple sites Has tried multiple treatments in the past including almost 19 surgeries No active infection at this time Referral to general surgery at [facility] for follow-up from previous surgeries and discussion of further surgical intervention  Possible exposure to STD Wet prep, GC/CT, HIV, RPR collected today Discussed safe sex practices Follow-up as needed    Rachel Crews, MD MPH PGY-2,  [facility] 05/24/2016  4:26 PM

## 2016-05-25 ENCOUNTER — Telehealth: Payer: Self-pay | Admitting: Family Medicine

## 2016-05-25 LAB — CERVICOVAGINAL ANCILLARY ONLY
CHLAMYDIA, DNA PROBE: NEGATIVE
NEISSERIA GONORRHEA: NEGATIVE

## 2016-05-25 LAB — RPR

## 2016-05-25 LAB — HIV ANTIBODY (ROUTINE TESTING W REFLEX): HIV: NONREACTIVE

## 2016-05-25 NOTE — Telephone Encounter (Signed)
LMOVM. Patient has been scheduled at [facility] on Mon 05/30/16 at 9:30 AM with Kittie Plater, NP. Appt is located [facility], [place] 4th Floor. Patient to arrive 15 mins early.

## 2016-05-26 ENCOUNTER — Telehealth: Payer: Self-pay | Admitting: Family Medicine

## 2016-05-26 NOTE — Telephone Encounter (Signed)
Patient informed, expressed understanding. 

## 2016-05-26 NOTE — Telephone Encounter (Signed)
Please call and notify patient that all other STD testing (HIV, RPR, GC/CT) negative.  Thanks! Virginia Crews, MD, MPH PGY-2,  [facility] 05/26/2016 8:36 AM

## 2016-10-25 ENCOUNTER — Encounter: Payer: Self-pay | Admitting: Internal Medicine

## 2016-10-25 ENCOUNTER — Other Ambulatory Visit (HOSPITAL_COMMUNITY)
Admission: RE | Admit: 2016-10-25 | Discharge: 2016-10-25 | Disposition: A | Discharge status: Home / Self Care | Payer: Medicaid Other | Source: Ambulatory Visit | Attending: Family Medicine | Admitting: Family Medicine

## 2016-10-25 ENCOUNTER — Ambulatory Visit (INDEPENDENT_AMBULATORY_CARE_PROVIDER_SITE_OTHER): Payer: Self-pay | Admitting: Internal Medicine

## 2016-10-25 VITALS — BP 173/87 | HR 81 | Temp 98.7°F | Wt 257.0 lb

## 2016-10-25 DIAGNOSIS — L732 Hidradenitis suppurativa: Secondary | ICD-10-CM

## 2016-10-25 DIAGNOSIS — N898 Other specified noninflammatory disorders of vagina: Secondary | ICD-10-CM

## 2016-10-25 DIAGNOSIS — I10 Essential (primary) hypertension: Secondary | ICD-10-CM

## 2016-10-25 DIAGNOSIS — Z113 Encounter for screening for infections with a predominantly sexual mode of transmission: Secondary | ICD-10-CM | POA: Insufficient documentation

## 2016-10-25 LAB — POCT WET PREP (WET MOUNT): Clue Cells Wet Prep Whiff POC: POSITIVE

## 2016-10-25 MED ORDER — HYDROCHLOROTHIAZIDE 25 MG PO TABS: 25.0000 mg | ORAL_TABLET | Freq: Every day | ORAL | 0 refills | Status: DC

## 2016-10-25 MED ORDER — METRONIDAZOLE 500 MG PO TABS: 2000.0000 mg | ORAL_TABLET | Freq: Once | ORAL | 0 refills | Status: AC

## 2016-10-25 MED ORDER — DOXYCYCLINE HYCLATE 100 MG PO TABS
100.0000 mg | ORAL_TABLET | Freq: Two times a day (BID) | ORAL | 0 refills | Status: DC
Start: 2016-10-25 — End: 2017-07-20

## 2016-10-25 NOTE — Patient Instructions (Addendum)
Take the antibiotics twice per day for two weeks. Apply warm compresses to the area.   Your wet prep showed that you had Trichomonas, a sexually transmitted disease, and Bacterial Vaginosis, an overgrowth of normal bacteria. These can both cause discharge. Please take the Flagyl 4 pills all at once to treat these conditions.

## 2016-10-26 DIAGNOSIS — N898 Other specified noninflammatory disorders of vagina: Secondary | ICD-10-CM | POA: Insufficient documentation

## 2016-10-26 LAB — CERVICOVAGINAL ANCILLARY ONLY
Chlamydia: NEGATIVE
Neisseria Gonorrhea: NEGATIVE

## 2016-10-26 NOTE — Assessment & Plan Note (Signed)
New boil under right breast appears to have signs of infection. No area of fluctuance appreciated so unlikely to benefit from I&D at present. Rx for 14 day course of Doxycyline given. Instructed patient to apply warm compresses to the area and if appreciates fluctuance she should return for drainage. Patient to monitor for signs of systemic infection.

## 2016-10-26 NOTE — Assessment & Plan Note (Signed)
Wet prep performed and positive for BV and Trichomonas. Rx for Flagyl given. GC/Chlamydia collected.

## 2016-10-26 NOTE — Progress Notes (Signed)
   Subjective:    Rachel Mckinney - 40 y.o. female MRN [id number]  Date of birth: 09-16-1976  HPI  Rachel Mckinney is here for boil under right breast and vaginal discharge.  Boil: Significant h/o Hidradenitis followed by [facility] plastic surgery. S/p surgery under left breast in July and most recent course of abx was Bactrim in August x2 weeks. Reports she developed a new boil under her right breast in the past 1-2 days. She was unable to see [facility] so made an appointment at the [facility]. Boil is painful. Has not been draining any fluid. She reports surrounding erythema around the boil. Denies fevers.   Vaginal Discharge: Present for approximately 1 week. Unsure if discharge is actually from the vagina or from draining chronic hidradenitis in the groin. Reports that it is bothersome because her underwear at damp. Denies vaginal pruritis or foul odor. Denies dysuria. Most recently sexually active in September 2017 and did not use condoms. Does have h/o Trichomonas.   -  reports that she has been smoking Cigarettes.  She has been smoking about 0.30 packs per day. She does not have any smokeless tobacco history on file. - Review of Systems: Per HPI. - Past Medical History: Patient Active Problem List   Diagnosis Date Noted  . Vaginal discharge 10/26/2016  . Possible exposure to STD 05/24/2016  . Trichomonal vaginitis 05/24/2016  . Abnormal uterine bleeding 03/02/2016  . Intertriginous candidiasis 01/26/2016  . Headache 01/26/2016  . Tobacco abuse 05/12/2015  . HTN (hypertension) 05/12/2015  . Menorrhagia 05/12/2015  . Preventative health care 09/03/2012  . Hidradenitis suppurativa-chronic and involving multiple sites 02/10/2012  . Morbid obesity ([place]) 11/16/2011   - Medications: reviewed and updated   Objective:   Physical Exam BP (!) 173/87   Pulse 81   Temp 98.7 F (37.1 C) (Oral)   Wt 257 lb (116.6 kg)   LMP 10/10/2016   BMI 44.11 kg/m  Gen: NAD, alert, cooperative with exam,  well-appearing Skin: indurated skin lesion underneath right breast approximately 2cm across with surrounding erythema and increased warmth, no fluctuance of lesion appreciated  GU/GYN: Exam performed in the presence of a chaperone. External genitalia within normal limits.  Vaginal mucosa pink, moist, normal rugae.  Nonfriable cervix without lesions or bleeding noted on speculum exam.  Minimal white, clumpy discharge present at cervical os. Bimanual exam revealed normal, nongravid uterus.  No cervical motion tenderness. No adnexal masses bilaterally.       Assessment & Plan:   Hidradenitis suppurativa-chronic and involving multiple sites New boil under right breast appears to have signs of infection. No area of fluctuance appreciated so unlikely to benefit from I&D at present. Rx for 14 day course of Doxycyline given. Instructed patient to apply warm compresses to the area and if appreciates fluctuance she should return for drainage. Patient to monitor for signs of systemic infection.   Vaginal discharge Wet prep performed and positive for BV and Trichomonas. Rx for Flagyl given. GC/Chlamydia collected.     Phill Myron, D.O. 10/26/2016, 11:30 AM PGY-2, [place]

## 2016-10-27 ENCOUNTER — Telehealth: Payer: Self-pay | Admitting: *Deleted

## 2016-10-27 NOTE — Telephone Encounter (Signed)
Tried to contact pt to inform her of below and the phone stated that she does not have VM set up.  If pt calls back please give her the below information. Rachel Mckinney, [name], [place]

## 2016-10-27 NOTE — Telephone Encounter (Signed)
-----   Message from Nicolette Bang, DO sent at 10/26/2016  5:53 PM EST ----- Please call patient and let her know GC/Chlamydia was negative.   Phill Myron, D.O. 10/26/2016, 5:52 PM PGY-2, [place]

## 2016-12-19 DIAGNOSIS — M7672 Peroneal tendinitis, left leg: Secondary | ICD-10-CM

## 2016-12-19 HISTORY — DX: Peroneal tendinitis, left leg: M76.72

## 2017-03-01 ENCOUNTER — Other Ambulatory Visit: Payer: Self-pay | Admitting: Family Medicine

## 2017-03-01 ENCOUNTER — Other Ambulatory Visit: Payer: Self-pay | Admitting: Internal Medicine

## 2017-03-01 DIAGNOSIS — L732 Hidradenitis suppurativa: Secondary | ICD-10-CM

## 2017-03-01 DIAGNOSIS — I10 Essential (primary) hypertension: Secondary | ICD-10-CM

## 2017-03-01 MED ORDER — SULFAMETHOXAZOLE-TRIMETHOPRIM 800-160 MG PO TABS: 1.0000 | ORAL_TABLET | Freq: Two times a day (BID) | ORAL | 0 refills | Status: DC

## 2017-05-08 ENCOUNTER — Emergency Department (HOSPITAL_COMMUNITY)
Admission: EM | Admit: 2017-05-08 | Discharge: 2017-05-08 | Disposition: A | Discharge status: Home / Self Care | Payer: Self-pay | Attending: Emergency Medicine | Admitting: Emergency Medicine

## 2017-05-08 ENCOUNTER — Emergency Department (HOSPITAL_COMMUNITY): Payer: Self-pay

## 2017-05-08 ENCOUNTER — Encounter (HOSPITAL_COMMUNITY): Payer: Self-pay | Admitting: Emergency Medicine

## 2017-05-08 DIAGNOSIS — F17210 Nicotine dependence, cigarettes, uncomplicated: Secondary | ICD-10-CM | POA: Insufficient documentation

## 2017-05-08 DIAGNOSIS — I10 Essential (primary) hypertension: Secondary | ICD-10-CM | POA: Insufficient documentation

## 2017-05-08 DIAGNOSIS — L0291 Cutaneous abscess, unspecified: Secondary | ICD-10-CM

## 2017-05-08 DIAGNOSIS — L02214 Cutaneous abscess of groin: Secondary | ICD-10-CM | POA: Insufficient documentation

## 2017-05-08 DIAGNOSIS — Y939 Activity, unspecified: Secondary | ICD-10-CM | POA: Insufficient documentation

## 2017-05-08 DIAGNOSIS — S93402A Sprain of unspecified ligament of left ankle, initial encounter: Secondary | ICD-10-CM | POA: Insufficient documentation

## 2017-05-08 DIAGNOSIS — X58XXXA Exposure to other specified factors, initial encounter: Secondary | ICD-10-CM | POA: Insufficient documentation

## 2017-05-08 DIAGNOSIS — M67432 Ganglion, left wrist: Secondary | ICD-10-CM | POA: Insufficient documentation

## 2017-05-08 DIAGNOSIS — Y929 Unspecified place or not applicable: Secondary | ICD-10-CM | POA: Insufficient documentation

## 2017-05-08 DIAGNOSIS — Y999 Unspecified external cause status: Secondary | ICD-10-CM | POA: Insufficient documentation

## 2017-05-08 MED ORDER — IBUPROFEN 600 MG PO TABS: 600.0000 mg | ORAL_TABLET | Freq: Four times a day (QID) | ORAL | 0 refills | Status: DC | PRN

## 2017-05-08 MED ORDER — CEPHALEXIN 500 MG PO CAPS: 500.0000 mg | ORAL_CAPSULE | Freq: Two times a day (BID) | ORAL | 0 refills | Status: DC

## 2017-05-08 MED ORDER — IBUPROFEN 200 MG PO TABS
600.0000 mg | ORAL_TABLET | Freq: Once | ORAL | Status: AC
  Administered 2017-05-08: 600 mg via ORAL
  Filled 2017-05-08: qty 3

## 2017-05-08 MED ORDER — SULFAMETHOXAZOLE-TRIMETHOPRIM 800-160 MG PO TABS: 1.0000 | ORAL_TABLET | Freq: Two times a day (BID) | ORAL | 0 refills | Status: AC

## 2017-05-08 NOTE — ED Triage Notes (Signed)
Pt verbalizes multiple abscesses to right groin, knot to left wrist, and left ankle swelling over past week without injury.

## 2017-05-08 NOTE — ED Provider Notes (Signed)
[facility] DEPT Provider Note   CSN: [id number] Arrival date & time: 05/08/17  1121     History   Chief Complaint Chief Complaint  Patient presents with  . Multiple Complaints    HPI Rachel Mckinney is a 41 y.o. female.  HPI   Pt is a 41 yo female with PMH of hidradenitis suppurativa and HTN who presents the ED with multiple complaints. Patient reports she has been having recurrent draining abscesses to her right groin for the past week. She notes they have continued to drain but are still causing her associated pain and discomfort. Endorses associated mild swelling to the area. Patient with history of recurrent abscesses but denies any recent use of antibiotics. She reports having multiple surgeries performed over the past few years regarding her recurrent abscesses. Denies fever, chills, abdominal pain, nausea, vomiting.   Patient also reports having gradually worsening swelling to her left wrist over the past 3-4 days. She reports waking up one morning and noticing a small knot on the side of her left wrist which has gradually become more swollen over the past few days. 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 She reports having a similar not present on the dorsal aspect of her left hand years ago in she was diagnosed with a ganglion cyst which was surgically removed. Patient denies any recent fall, trauma or injury. Denies redness, warmth, numbness, weakness. Reports taking ibuprofen at home with mild intermittent relief. Denies history of gout.  Patient also presents with gradually worsening left ankle pain and swelling for the past 5 days. She notes she has had worsening swelling to the lateral aspect of her left ankle which she notes is worse after standing on her feet all day at work. Denies any recent fall, trauma or injury. Patient reports pain is worsened with palpation, bearing weight or range of motion.  Denies redness, warmth, numbness, weakness, calf swelling or tenderness.  Past Medical History:  Diagnosis Date  . Boils    under breast and inner thigh  . Hidradenitis suppurativa    Chronic problem for patient, multiple admissions for surgical drainage  . Hypertension     Patient Active Problem List   Diagnosis Date Noted  . Vaginal discharge 10/26/2016  . Possible exposure to STD 05/24/2016  . Trichomonal vaginitis 05/24/2016  . Abnormal uterine bleeding 03/02/2016  . Intertriginous candidiasis 01/26/2016  . Headache 01/26/2016  . Tobacco abuse 05/12/2015  . HTN (hypertension) 05/12/2015  . Menorrhagia 05/12/2015  . Preventative health care 09/03/2012  . Hidradenitis suppurativa-chronic and involving multiple sites 02/10/2012  . Morbid obesity ([place]) 11/16/2011    Past Surgical History:  Procedure Laterality Date  . CESAREAN SECTION    . multiple abscess drainages      OB History    Gravida Para Term Preterm AB Living   3       2 1    SAB TAB Ectopic Multiple Live Births           1       Home Medications    Prior to Admission medications   Medication Sig Start Date End Date Taking? Authorizing Provider  ferrous sulfate 325 (65 FE) MG tablet Take 1 tablet (325 mg total) by mouth 3 (three) times daily with meals. 03/02/16  Yes Bacigalupo, Dionne Bucy, MD  hydrochlorothiazide (HYDRODIURIL) 25 MG tablet TAKE 1 TABLET (25 MG TOTAL) BY MOUTH DAILY. 03/01/17  Yes Bacigalupo,  Dionne Bucy, MD  cephALEXin (KEFLEX) 500 MG capsule Take 1 capsule (500 mg total) by mouth 2 (two) times daily. 05/08/17   Nona Dell, PA-C  chlorhexidine (HIBICLENS) 4 % external liquid Apply topically daily as needed. Patient not taking: Reported on 05/08/2017 06/17/15   Coral Spikes, DO  Clotrimazole 1 % OINT Apply to affected areas twice daily. Patient not taking: Reported on 05/08/2017 06/17/15   Coral Spikes, DO  doxycycline (VIBRA-TABS) 100 MG tablet Take 1 tablet (100 mg total) by mouth  2 (two) times daily. Patient not taking: Reported on 05/08/2017 10/25/16   Nicolette Bang, DO  ibuprofen (ADVIL,MOTRIN) 600 MG tablet Take 1 tablet (600 mg total) by mouth every 6 (six) hours as needed. 05/08/17   Nona Dell, PA-C  sulfamethoxazole-trimethoprim (BACTRIM DS,SEPTRA DS) 800-160 MG tablet Take 1 tablet by mouth 2 (two) times daily. 05/08/17 05/15/17  Nona Dell, PA-C    Family History Family History  Problem Relation Age of Onset  . Liver disease Father   . Hypertension Father   . Hypertension Sister     Social History Social History  Substance Use Topics  . Smoking status: Current Every Day Smoker    Packs/day: 0.30    Types: Cigarettes  . Smokeless tobacco: Not on file  . Alcohol use Yes     Comment: on weekends     Allergies   Patient has no known allergies.   Review of Systems Review of Systems  Musculoskeletal: Positive for arthralgias (left wrist and ankle) and joint swelling.  Skin: Positive for wound (abscess).  All other systems reviewed and are negative.    Physical Exam Updated Vital Signs BP (!) 173/89 (BP Location: Left Arm)   Pulse 81   Temp 97.9 F (36.6 C) (Oral)   Resp 18   LMP 04/14/2017   SpO2 100%   Physical Exam  Constitutional: She is oriented to person, place, and time. She appears well-developed and well-nourished.  HENT:  Head: Normocephalic and atraumatic.  Eyes: Conjunctivae and EOM are normal. Right eye exhibits no discharge. Left eye exhibits no discharge. No scleral icterus.  Neck: Normal range of motion. Neck supple.  Cardiovascular: Normal rate and intact distal pulses.   Pulmonary/Chest: Effort normal.  Abdominal: Soft. She exhibits no distension and no mass. There is no tenderness. There is no guarding.  Genitourinary:  Genitourinary Comments: Multiple small actively draining abscesses present to right groin region with mild surrounding swelling and erythema, TTP.     Musculoskeletal: Normal range of motion. She exhibits tenderness. She exhibits no edema or deformity.       Left elbow: Normal.       Left wrist: She exhibits tenderness and swelling. She exhibits normal range of motion, no effusion, no crepitus, no deformity and no laceration.       Left knee: Normal.       Left ankle: She exhibits swelling. She exhibits normal range of motion, no ecchymosis, no deformity, no laceration and normal pulse. Tenderness. Lateral malleolus tenderness found. Achilles tendon normal.       Left forearm: Normal.       Left hand: Normal.       Hands:      Left lower leg: Normal.       Left foot: Normal.       Feet:  Mild swelling present to left lateral malleolus. No erythema, warmth, ecchymoses, abrasion, laceration. FROM of left foot, ankle and knee with 5/5  strength. 2+ DP pulse. Sensation intact. No calf swelling or tenderness.  Neurological: She is alert and oriented to person, place, and time.  Skin: Skin is warm and dry.  Nursing note and vitals reviewed.    ED Treatments / Results  Labs (all labs ordered are listed, but only abnormal results are displayed) Labs Reviewed - No data to display  EKG  EKG Interpretation None       Radiology Dg Wrist Complete Left  Result Date: 05/08/2017 CLINICAL DATA:  Left hand pain and swelling. EXAM: LEFT WRIST - COMPLETE 3+ VIEW COMPARISON:  None. FINDINGS: The joint spaces are maintained. No acute bony abnormality. No significant degenerative changes or erosive findings. IMPRESSION: No acute bony findings Electronically Signed   By: Marijo Sanes M.D.   On: 05/08/2017 12:58   Dg Ankle Complete Left  Result Date: 05/08/2017 CLINICAL DATA:  Joint pain, swelling. EXAM: LEFT ANKLE COMPLETE - 3+ VIEW COMPARISON:  None. FINDINGS: There is no evidence of fracture, dislocation, or joint effusion. There is no evidence of arthropathy or other focal bone abnormality. Soft tissues are unremarkable. IMPRESSION: Negative.  Electronically Signed   By: Rolm Baptise M.D.   On: 05/08/2017 12:58    Procedures Procedures (including critical care time)  Medications Ordered in ED Medications  ibuprofen (ADVIL,MOTRIN) tablet 600 mg (600 mg Oral Given 05/08/17 1211)     Initial Impression / Assessment and Plan / ED Course  I have reviewed the triage vital signs and the nursing notes.  Pertinent labs & imaging results that were available during my care of the patient were reviewed by me and considered in my medical decision making (see chart for details).     Pt presents with recurrent right groin abscesses, actively draining. Hx of hidradenitis. Denies fever. Denies recent abx use. VSS. Exam showed multiple small actively draining abscesses to right groin with mild swelling and erythema present. Due to patient with history of recurrent abscesses plan to start patient on antibiotics and discussed symptomatic treatment at home. Advised patient to follow up with PCP in 3-4 days for wound recheck. I also advised patient to schedule an appointment with her general surgeon regarding reevaluation and further management of her recurrent abscesses. Discussed return precautions.  Patient also presented with area of swelling to left wrist that appear consistent with ganglion cyst. Denies any recent fall, trauma or injury. No erythema, warmth, induration or fluctuance on exam. Do not suspect abscess, infection or gout. Xray negative.  Plan to discharge patient home with NSAIDs and symptomatic treatment. Advised patient to follow up with her hand surgeon for reevaluation and further management.  Patient also presents with left ankle swelling for the past week. Xray negative. Suspect ankle sprain. No bony abnormality or deformity, no erythema or excessive heat, no evidence of cellulitis, DVT, gout or septic joint. Patient discharged home with so ankle brace, NSAIDs and symptomatic treatment. Advised to follow-up with orthopedics as  needed.  Final Clinical Impressions(s) / ED Diagnoses   Final diagnoses:  Sprain of left ankle, unspecified ligament, initial encounter  Ganglion cyst of wrist, left  Abscess    New Prescriptions New Prescriptions   CEPHALEXIN (KEFLEX) 500 MG CAPSULE    Take 1 capsule (500 mg total) by mouth 2 (two) times daily.   IBUPROFEN (ADVIL,MOTRIN) 600 MG TABLET    Take 1 tablet (600 mg total) by mouth every 6 (six) hours as needed.   SULFAMETHOXAZOLE-TRIMETHOPRIM (BACTRIM DS,SEPTRA DS) 800-160 MG TABLET    Take  1 tablet by mouth 2 (two) times daily.     Nona Dell, PA-C 05/08/17 1415    Lacretia Leigh, MD 05/09/17 1531

## 2017-05-08 NOTE — Discharge Instructions (Signed)
Take your antibiotics as prescribed until completed. I recommend continuing to apply warm compresses to abscesses 3-4 times daily. Follow-up with your primary care provider within the next 3-4 days for wound recheck. Continue wearing your ankle brace as needed until your pain has improved. I also recommend taking ibuprofen, resting, elevating and applying ice for 10-15 minutes 3-4 times daily. If her pain has not improved within the next week I recommend calling the orthopedic clinic listed below. I also recommend following up with your hand surgeon regarding your suspected left ganglion cyst for further management.

## 2017-06-20 ENCOUNTER — Emergency Department (HOSPITAL_COMMUNITY)
Admission: EM | Admit: 2017-06-20 | Discharge: 2017-06-20 | Disposition: A | Discharge status: Home / Self Care | Payer: Medicaid Other | Attending: Emergency Medicine | Admitting: Emergency Medicine

## 2017-06-20 ENCOUNTER — Encounter (HOSPITAL_COMMUNITY): Payer: Self-pay | Admitting: Emergency Medicine

## 2017-06-20 DIAGNOSIS — M25572 Pain in left ankle and joints of left foot: Secondary | ICD-10-CM

## 2017-06-20 DIAGNOSIS — W010XXD Fall on same level from slipping, tripping and stumbling without subsequent striking against object, subsequent encounter: Secondary | ICD-10-CM | POA: Insufficient documentation

## 2017-06-20 DIAGNOSIS — M7732 Calcaneal spur, left foot: Secondary | ICD-10-CM

## 2017-06-20 DIAGNOSIS — S93402D Sprain of unspecified ligament of left ankle, subsequent encounter: Secondary | ICD-10-CM

## 2017-06-20 DIAGNOSIS — I10 Essential (primary) hypertension: Secondary | ICD-10-CM | POA: Insufficient documentation

## 2017-06-20 DIAGNOSIS — R200 Anesthesia of skin: Secondary | ICD-10-CM | POA: Insufficient documentation

## 2017-06-20 DIAGNOSIS — Z79899 Other long term (current) drug therapy: Secondary | ICD-10-CM | POA: Insufficient documentation

## 2017-06-20 DIAGNOSIS — F17210 Nicotine dependence, cigarettes, uncomplicated: Secondary | ICD-10-CM | POA: Insufficient documentation

## 2017-06-20 DIAGNOSIS — M25562 Pain in left knee: Secondary | ICD-10-CM | POA: Insufficient documentation

## 2017-06-20 DIAGNOSIS — R2242 Localized swelling, mass and lump, left lower limb: Secondary | ICD-10-CM | POA: Insufficient documentation

## 2017-06-20 MED ORDER — KETOROLAC TROMETHAMINE 30 MG/ML IJ SOLN
30.0000 mg | Freq: Once | INTRAMUSCULAR | Status: AC
Start: 2017-06-20 — End: 2017-06-20
  Administered 2017-06-20: 30 mg via INTRAMUSCULAR
  Filled 2017-06-20: qty 1

## 2017-06-20 MED ORDER — MELOXICAM 15 MG PO TABS: 15.0000 mg | ORAL_TABLET | Freq: Every day | ORAL | 0 refills | Status: DC
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
## 2017-06-20 NOTE — Discharge Instructions (Addendum)
Wear CAM walker during the day/weight bearing activities for the next 1-2 weeks to rest the ankle and for stabilization of ankle. However it's very important to take the CAM walker off each night and stretch/move the ankle/leg in order to avoid getting stiff. Alternate between ice and heat, and elevate ankle throughout the day, using ice/heat pack for no more than 20 minutes every hour.  First thing in the morning, use a frozen water bottle to roll under your foot before the first steps of the day, for at least 10-15 minutes each morning. Use mobic daily as directed, don't use additional NSAIDs like ibuprofen/aleve/etc while taking mobic. Use additional tylenol as needed for additional pain relief.  Follow up with the podiatrist in 1-2 weeks for recheck of ongoing ankle pain and ongoing management of your ankle pain. Return to the ER for changes or worsening symptoms.
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
## 2017-07-17 ENCOUNTER — Ambulatory Visit (INDEPENDENT_AMBULATORY_CARE_PROVIDER_SITE_OTHER): Payer: Self-pay | Admitting: Podiatry

## 2017-07-17 DIAGNOSIS — M7672 Peroneal tendinitis, left leg: Secondary | ICD-10-CM

## 2017-07-17 MED ORDER — DICLOFENAC SODIUM 75 MG PO TBEC: 75.0000 mg | DELAYED_RELEASE_TABLET | Freq: Two times a day (BID) | ORAL | 0 refills | Status: DC

## 2017-07-17 MED ORDER — TRAMADOL HCL 50 MG PO TABS: 50.0000 mg | ORAL_TABLET | Freq: Three times a day (TID) | ORAL | 0 refills | Status: DC | PRN

## 2017-07-17 NOTE — Patient Instructions (Signed)
Pre-Operative Instructions  Congratulations, you have decided to take an important step towards improving your quality of life.  You can be assured that the doctors and staff at [facility] will be with you every step of the way.  Here are some important things you should know:  1. Plan to be at the surgery center/hospital at least 1 (one) hour prior to your scheduled time, unless otherwise directed by the surgical center/hospital staff.  You must have a responsible adult accompany you, remain during the surgery and drive you home.  Make sure you have directions to the surgical center/hospital to ensure you arrive on time. 2. If you are having surgery at [facility] or [facility] hospitals, you will need a copy of your medical history and physical form from your family physician within one month prior to the date of surgery. We will give you a form for your primary physician to complete.  3. We make every effort to accommodate the date you request for surgery.  However, there are times where surgery dates or times have to be moved.  We will contact you as soon as possible if a change in schedule is required.   4. No aspirin/ibuprofen for one week before surgery.  If you are on aspirin, any non-steroidal anti-inflammatory medications (Mobic, Aleve, Ibuprofen) should not be taken seven (7) days prior to your surgery.  You make take Tylenol for pain prior to surgery.  5. Medications - If you are taking daily heart and blood pressure medications, seizure, reflux, allergy, asthma, anxiety, pain or diabetes medications, make sure you notify the surgery center/hospital before the day of surgery so they can tell you which medications you should take or avoid the day of surgery. 6. No food or drink after midnight the night before surgery unless directed otherwise by surgical center/hospital staff. 7. No alcoholic beverages 24-hours prior to surgery.  No smoking 24-hours prior or 24-hours after  surgery. 8. Wear loose pants or shorts. They should be loose enough to fit over bandages, boots, and casts. 9. Don't wear slip-on shoes. Sneakers are preferred. 10. Bring your boot with you to the surgery center/hospital.  Also bring crutches or a walker if your physician has prescribed it for you.  If you do not have this equipment, it will be provided for you after surgery. 11. If you have not been contacted by the surgery center/hospital by the day before your surgery, call to confirm the date and time of your surgery. 12. Leave-time from work may vary depending on the type of surgery you have.  Appropriate arrangements should be made prior to surgery with your employer. 13. Prescriptions will be provided immediately following surgery by your doctor.  Fill these as soon as possible after surgery and take the medication as directed. Pain medications will not be refilled on weekends and must be approved by the doctor. 14. Remove nail polish on the operative foot and avoid getting pedicures prior to surgery. 15. Wash the night before surgery.  The night before surgery wash the foot and leg well with water and the antibacterial soap provided. Be sure to pay special attention to beneath the toenails and in between the toes.  Wash for at least three (3) minutes. Rinse thoroughly with water and dry well with a towel.  Perform this wash unless told not to do so by your physician.  Enclosed: 1 Ice pack (please put in freezer the night before surgery)   1 Hibiclens skin cleaner     Pre-op instructions  If you have any questions regarding the instructions, please do not hesitate to call our office.  [place]: [address], [place], [place] [address] -- [phone]  [place]: [address] [place], [place] [address] -- [phone]  [place]: [address]  , [place] [address] -- [phone]  [place]: [address], [address], [place], [place] [address] -- [phone]  Website:  [email or web address] 

## 2017-07-17 NOTE — Progress Notes (Signed)
   HPI: 41 year old female presents to the office today for evaluation of left ankle pain to the lateral aspect. Pains been ongoing for 3 months. Patient initially injured her foot when she jumped off a porch in April due to the torn 83 which came through town. Patient has had significant pain and tenderness over since. Patient had x-rays taken in May which were negative for any fracture. There's been no alleviating symptoms.   Physical Exam: General: The patient is alert and oriented x3 in no acute distress.  Dermatology: Skin is warm, dry and supple bilateral lower extremities. Negative for open lesions or macerations.  Vascular: Palpable pedal pulses bilaterally. No edema or erythema noted. Capillary refill within normal limits.  Neurological: Epicritic and protective threshold grossly intact bilaterally.   Musculoskeletal Exam: Significant pain on palpation along the peroneal tendon sheath of the left lower extremity. Very suggestive of possible peroneal tendon tear. Range of motion within normal limits to all pedal and ankle joints bilateral. Muscle strength 5/5 in all groups bilateral.   Assessment: 1. Possible peroneal tendon tear left lower extremity 2. Peroneal tendinitis left lower extremity   Plan of Care:  1. Patient was evaluated. 2. Today informed the patient that she likely needs an MRI to rule out tendon rupture of the peroneal. Today we will order an MRI left lower extremity for possible peroneal tendon rupture 3. Continued immobilization cam boot 4. Prescription for diclofenac 75 mg 5. Prescription for tramadol 50 mg 6. Compression anklet dispensed 7. Return to clinic in 4 weeks for follow-up evaluation and to review MRI results   Edrick Kins, DPM [facility]  Dr. Edrick Kins, DPM    [address], [place] [address]                Office [phone]  Fax [phone]

## 2017-07-18 ENCOUNTER — Telehealth: Payer: Self-pay | Admitting: *Deleted

## 2017-07-18 DIAGNOSIS — M7672 Peroneal tendinitis, left leg: Secondary | ICD-10-CM

## 2017-07-18 NOTE — Telephone Encounter (Addendum)
-----   Message from Edrick Kins, DPM sent at 07/17/2017  4:57 PM EDT ----- Regarding: MRI left ankle Please order MRI with or without contrast left ankle Diagnosis: Possible peroneal tendon rupture  Thanks, Dr. Amalia Hailey  FYI: Patient is a self-pay.07/18/2017-Orders faxed to [facility] Radiology Scheduling.07/28/2017-Pt states she has a questions concerning the pain she is having. Pt states her knees get very stiff especially in the morning, and has been to ED for chest pain. Pt states she is in so much pain with the knees and legs she cannot work, and would like a note to be out of work until 08/11/2017 appt. Dr. Paulla Dolly states that would be fine. Pt to pick up note in the [place] office.

## 2017-07-20 ENCOUNTER — Encounter (HOSPITAL_COMMUNITY): Payer: Self-pay | Admitting: Emergency Medicine

## 2017-07-20 ENCOUNTER — Emergency Department (HOSPITAL_COMMUNITY)
Admission: EM | Admit: 2017-07-20 | Discharge: 2017-07-20 | Disposition: A | Discharge status: Home / Self Care | Payer: Medicaid Other | Attending: Emergency Medicine | Admitting: Emergency Medicine

## 2017-07-20 DIAGNOSIS — R5381 Other malaise: Secondary | ICD-10-CM | POA: Insufficient documentation

## 2017-07-20 DIAGNOSIS — N898 Other specified noninflammatory disorders of vagina: Secondary | ICD-10-CM | POA: Insufficient documentation

## 2017-07-20 DIAGNOSIS — L732 Hidradenitis suppurativa: Secondary | ICD-10-CM

## 2017-07-20 DIAGNOSIS — I10 Essential (primary) hypertension: Secondary | ICD-10-CM | POA: Insufficient documentation

## 2017-07-20 LAB — CBC WITH DIFFERENTIAL/PLATELET
BASOS ABS: 0 10*3/uL (ref 0.0–0.1)
BASOS PCT: 0 %
Eosinophils Absolute: 0.1 10*3/uL (ref 0.0–0.7)
Eosinophils Relative: 1 %
HCT: 30.1 % — ABNORMAL LOW (ref 36.0–46.0)
Hemoglobin: 10.1 g/dL — ABNORMAL LOW (ref 12.0–15.0)
LYMPHS PCT: 17 %
Lymphs Abs: 1.9 10*3/uL (ref 0.7–4.0)
MCH: 29.5 pg (ref 26.0–34.0)
MCHC: 33.6 g/dL (ref 30.0–36.0)
MCV: 88 fL (ref 78.0–100.0)
MONO ABS: 0.8 10*3/uL (ref 0.1–1.0)
MONOS PCT: 7 %
Neutro Abs: 8.2 10*3/uL — ABNORMAL HIGH (ref 1.7–7.7)
Neutrophils Relative %: 75 %
Platelets: 438 10*3/uL — ABNORMAL HIGH (ref 150–400)
RBC: 3.42 MIL/uL — ABNORMAL LOW (ref 3.87–5.11)
RDW: 14.3 % (ref 11.5–15.5)
WBC: 11 10*3/uL — ABNORMAL HIGH (ref 4.0–10.5)

## 2017-07-20 LAB — WET PREP, GENITAL
Clue Cells Wet Prep HPF POC: NONE SEEN
SPERM: NONE SEEN
Yeast Wet Prep HPF POC: NONE SEEN

## 2017-07-20 LAB — BASIC METABOLIC PANEL
ANION GAP: 9 (ref 5–15)
BUN: 16 mg/dL (ref 6–20)
CALCIUM: 8.8 mg/dL — AB (ref 8.9–10.3)
CO2: 25 mmol/L (ref 22–32)
CREATININE: 0.95 mg/dL (ref 0.44–1.00)
Chloride: 102 mmol/L (ref 101–111)
GFR calc Af Amer: >60 mL/min (ref >60)
GLUCOSE: 104 mg/dL — AB (ref 65–99)
Potassium: 3.8 mmol/L (ref 3.5–5.1)
Sodium: 136 mmol/L (ref 135–145)

## 2017-07-20 LAB — URINALYSIS, ROUTINE W REFLEX MICROSCOPIC
BILIRUBIN URINE: NEGATIVE
GLUCOSE, UA: NEGATIVE mg/dL
KETONES UR: 5 mg/dL — AB
NITRITE: NEGATIVE
PH: 5 (ref 5.0–8.0)
Protein, ur: 100 mg/dL — AB
SPECIFIC GRAVITY, URINE: 1.031 — AB (ref 1.005–1.030)

## 2017-07-20 LAB — I-STAT CG4 LACTIC ACID, ED: Lactic Acid, Venous: 0.66 mmol/L (ref 0.5–1.9)

## 2017-07-20 LAB — POC URINE PREG, ED: Preg Test, Ur: NEGATIVE

## 2017-07-20 MED ORDER — ONDANSETRON HCL 4 MG/2ML IJ SOLN
4.0000 mg | Freq: Once | INTRAMUSCULAR | Status: AC
  Administered 2017-07-20: 4 mg via INTRAVENOUS
  Filled 2017-07-20: qty 2

## 2017-07-20 MED ORDER — DOXYCYCLINE HYCLATE 100 MG PO CAPS: 100.0000 mg | ORAL_CAPSULE | Freq: Two times a day (BID) | ORAL | 0 refills | Status: DC

## 2017-07-20 MED ORDER — LIDOCAINE HCL 1 % IJ SOLN
INTRAMUSCULAR | Status: AC
  Administered 2017-07-20: 20 mL
  Filled 2017-07-20: qty 20

## 2017-07-20 MED ORDER — CEFTRIAXONE SODIUM 250 MG IJ SOLR
250.0000 mg | Freq: Once | INTRAMUSCULAR | Status: AC
  Administered 2017-07-20: 250 mg via INTRAMUSCULAR
  Filled 2017-07-20: qty 250

## 2017-07-20 MED ORDER — METRONIDAZOLE 500 MG PO TABS
2000.0000 mg | ORAL_TABLET | Freq: Once | ORAL | Status: AC
  Administered 2017-07-20: 2000 mg via ORAL
  Filled 2017-07-20: qty 4

## 2017-07-20 MED ORDER — AZITHROMYCIN 250 MG PO TABS
1000.0000 mg | ORAL_TABLET | Freq: Once | ORAL | Status: AC
  Administered 2017-07-20: 1000 mg via ORAL
  Filled 2017-07-20: qty 4

## 2017-07-20 MED ORDER — CEPHALEXIN 500 MG PO CAPS: 500.0000 mg | ORAL_CAPSULE | Freq: Two times a day (BID) | ORAL | 0 refills | Status: DC

## 2017-07-20 MED ORDER — MORPHINE SULFATE (PF) 4 MG/ML IV SOLN
4.0000 mg | Freq: Once | INTRAVENOUS | Status: AC
  Administered 2017-07-20: 4 mg via INTRAVENOUS
  Filled 2017-07-20: qty 1

## 2017-07-20 MED ORDER — SODIUM CHLORIDE 0.9 % IV BOLUS (SEPSIS)
1000.0000 mL | Freq: Once | INTRAVENOUS | Status: AC
  Administered 2017-07-20: 1000 mL via INTRAVENOUS

## 2017-07-20 NOTE — ED Notes (Signed)
Going over discharge paperwork and prescriptions with patient, patient requesting stronger antibiotic than Keflex, states, "I have had it before and didn't work".   Made Claudia PA aware.  Awaiting further instructions.

## 2017-07-20 NOTE — ED Provider Notes (Signed)
[facility] DEPT Provider Note   CSN: [id number] Arrival date & time: 07/20/17  1105     History   Chief Complaint Chief Complaint  Patient presents with  . Abscess    HPI Rachel Mckinney is a 41 y.o. female with history of hidradenitis suppurativa with multiple chronic boils to the pelvic region, hypertension, obesity presents to the emergency department for evaluation of worsening pain, drainage, swelling to "down there" 1-2 weeks. Patient states that she is unable to tell where the new drainage, swelling and odor is coming from, she has multiple boils to vagina and buttocks. She noticed scant amount of bleeding from her genital area, she is unsure if this was from her vagina or from boils. Reports associated dark urine, change in smell of drainage, generalized malaise, subjective fever. She is sexually active with men only with inconsistent use of condoms, new partner within 1 month. Reports history of STDs in the past including Trichomonas. States she got magnesium sulfate cream on 1-2 weeks ago that she's been applying to her boils, she wonders if this has caused her symptoms today.   Denies nausea, vomiting, abdominal pain, dysuria. Per chart review, patient was seen in ED for drainage of chronic boil and discharged with Keflex in May 2018.   HPI  Past Medical History:  Diagnosis Date  . Boils    under breast and inner thigh  . Hidradenitis suppurativa    Chronic problem for patient, multiple admissions for surgical drainage  . Hypertension     Patient Active Problem List   Diagnosis Date Noted  . Vaginal discharge 10/26/2016  . Possible exposure to STD 05/24/2016  . Trichomonal vaginitis 05/24/2016  . Abnormal uterine bleeding 03/02/2016  . Intertriginous candidiasis 01/26/2016  . Headache 01/26/2016  . Tobacco abuse 05/12/2015  . HTN (hypertension) 05/12/2015  . Menorrhagia 05/12/2015  . Preventative health care 09/03/2012  . Hidradenitis suppurativa-chronic and  involving multiple sites 02/10/2012  . Morbid obesity ([place]) 11/16/2011    Past Surgical History:  Procedure Laterality Date  . CESAREAN SECTION    . multiple abscess drainages      OB History    Gravida Para Term Preterm AB Living   3       2 1    SAB TAB Ectopic Multiple Live Births           1       Home Medications    Prior to Admission medications   Medication Sig Start Date End Date Taking? Authorizing Provider  acetaminophen (TYLENOL) 500 MG tablet Take 1,000 mg by mouth every 6 (six) hours as needed for moderate pain.   Yes [provider]  aspirin-acetaminophen-caffeine (EXCEDRIN MIGRAINE) [phone] MG tablet Take by mouth every 6 (six) hours as needed (pain).   Yes [provider]  diclofenac (VOLTAREN) 75 MG EC tablet Take 1 tablet (75 mg total) by mouth 2 (two) times daily. 07/17/17  Yes Edrick Kins, DPM  hydrochlorothiazide (HYDRODIURIL) 25 MG tablet TAKE 1 TABLET (25 MG TOTAL) BY MOUTH DAILY. 03/01/17  Yes Bacigalupo, Dionne Bucy, MD  OVER THE COUNTER MEDICATION Apply 1 application topically daily. Magnesium sulfate cream   Yes [provider]  cephALEXin (KEFLEX) 500 MG capsule Take 1 capsule (500 mg total) by mouth 2 (two) times daily. 07/20/17   Kinnie Feil, PA-C  chlorhexidine (HIBICLENS) 4 % external liquid Apply topically daily as needed. Patient not taking: Reported on 05/08/2017 06/17/15   Coral Spikes, DO  Clotrimazole  1 % OINT Apply to affected areas twice daily. Patient not taking: Reported on 05/08/2017 06/17/15   Coral Spikes, DO  doxycycline (VIBRAMYCIN) 100 MG capsule Take 1 capsule (100 mg total) by mouth 2 (two) times daily. 07/20/17   Kinnie Feil, PA-C  ferrous sulfate 325 (65 FE) MG tablet Take 1 tablet (325 mg total) by mouth 3 (three) times daily with meals. Patient not taking: Reported on 07/20/2017 03/02/16   Virginia Crews, MD  ibuprofen (ADVIL,MOTRIN) 600 MG tablet Take 1 tablet (600 mg total) by mouth every  6 (six) hours as needed. Patient not taking: Reported on 07/20/2017 05/08/17   Nona Dell, PA-C  meloxicam (MOBIC) 15 MG tablet Take 1 tablet (15 mg total) by mouth daily. TAKE WITH MEALS Patient not taking: Reported on 07/20/2017 06/20/17   Street, [place], PA-C  traMADol (ULTRAM) 50 MG tablet Take 1 tablet (50 mg total) by mouth every 8 (eight) hours as needed. 07/17/17   Edrick Kins, DPM    Family History Family History  Problem Relation Age of Onset  . Liver disease Father   . Hypertension Father   . Hypertension Sister     Social History Social History  Substance Use Topics  . Smoking status: Current Every Day Smoker    Packs/day: 0.30    Types: Cigarettes  . Smokeless tobacco: Never Used  . Alcohol use Yes     Comment: on weekends     Allergies   Patient has no known allergies.   Review of Systems Review of Systems  Constitutional: Positive for fever. Negative for chills and diaphoresis.  HENT: Negative for sore throat.   Respiratory: Negative for cough and shortness of breath.   Cardiovascular: Negative for chest pain.  Gastrointestinal: Negative for abdominal pain, constipation, diarrhea, nausea and vomiting.  Genitourinary: Positive for genital sores, vaginal bleeding, vaginal discharge and vaginal pain. Negative for difficulty urinating and dysuria.  Musculoskeletal: Negative for myalgias.  Skin: Positive for color change and wound.  Neurological: Negative for headaches.     Physical Exam Updated Vital Signs BP (!) 151/104 (BP Location: Left Arm)   Pulse 90   Temp 98.8 F (37.1 C) (Oral)   Resp 18   Ht 5\' 5"  (1.651 m)   Wt 113.4 kg (250 lb)   LMP 07/10/2017   SpO2 100%   BMI 41.60 kg/m   Physical Exam  Constitutional: She is oriented to person, place, and time. She appears well-developed and well-nourished. No distress.  HENT:  Head: Normocephalic and atraumatic.  Nose: Nose normal.  Mouth/Throat: Oropharynx is clear and moist. No  oropharyngeal exudate.  Eyes: Pupils are equal, round, and reactive to light. Conjunctivae and EOM are normal.  Neck: Normal range of motion. Neck supple.  Cardiovascular: Normal rate, regular rhythm, normal heart sounds and intact distal pulses.   No murmur heard. Pulmonary/Chest: Effort normal and breath sounds normal. No respiratory distress. She has no wheezes. She has no rales. She exhibits no tenderness.  Abdominal: Soft. Bowel sounds are normal. She exhibits no distension. There is no tenderness. There is no guarding.  Obese abdomen. No suprapubic or CVA tenderness. Multiple, actively draining and open hidradenitis to pannus, labia majora, intergluteal and gluteal cleft  Genitourinary:  Genitourinary Comments:  Multiple inflammatory open nodules and sinus tracts with active clear/yellow drainage in intertriginous areas including under panus, inguinal folds, intergluteal cleft and around clitoris. Some scarring. Appropriately tender. No palpable fluctuance. No significant extension of erythema beyond skin  folds.  Bilateral labia majora is erythematous, edematous, tender, without focal fluctuance or evidence of abscess  Vaginal mucosa and cervix pink with copious amounts of white discharge Uterus in midline, smooth, not enlarged or tender.  No CMT. Non palpable adnexa.  Musculoskeletal: Normal range of motion. She exhibits no deformity.  Lymphadenopathy:    She has no cervical adenopathy.  Neurological: She is alert and oriented to person, place, and time. No sensory deficit.  Skin: Skin is warm and dry. Capillary refill takes less than 2 seconds.  Psychiatric: She has a normal mood and affect. Her behavior is normal. Judgment and thought content normal.  Nursing note and vitals reviewed.    ED Treatments / Results  Labs (all labs ordered are listed, but only abnormal results are displayed) Labs Reviewed  WET PREP, GENITAL - Abnormal; Notable for the following:       Result Value     Trich, Wet Prep PRESENT (*)    WBC, Wet Prep HPF POC FEW (*)    All other components within normal limits  CBC WITH DIFFERENTIAL/PLATELET - Abnormal; Notable for the following:    WBC 11.0 (*)    RBC 3.42 (*)    Hemoglobin 10.1 (*)    HCT 30.1 (*)    Platelets 438 (*)    Neutro Abs 8.2 (*)    All other components within normal limits  BASIC METABOLIC PANEL - Abnormal; Notable for the following:    Glucose, Bld 104 (*)    Calcium 8.8 (*)    All other components within normal limits  URINALYSIS, ROUTINE W REFLEX MICROSCOPIC - Abnormal; Notable for the following:    Color, Urine AMBER (*)    APPearance HAZY (*)    Specific Gravity, Urine 1.031 (*)    Hgb urine dipstick MODERATE (*)    Ketones, ur 5 (*)    Protein, ur 100 (*)    Leukocytes, UA MODERATE (*)    Bacteria, UA RARE (*)    Squamous Epithelial / LPF 6-30 (*)    All other components within normal limits  CULTURE, BLOOD (ROUTINE X 2)  CULTURE, BLOOD (ROUTINE X 2)  URINE CULTURE  I-STAT CG4 LACTIC ACID, ED  POC URINE PREG, ED  I-STAT CG4 LACTIC ACID, ED  GC/CHLAMYDIA PROBE AMP ([place]) NOT AT [facility]    EKG  EKG Interpretation None       Radiology No results found.  Procedures Procedures (including critical care time)  Medications Ordered in ED Medications  sodium chloride 0.9 % bolus 1,000 mL (0 mLs Intravenous Stopped 07/20/17 1330)  morphine 4 MG/ML injection 4 mg (4 mg Intravenous Given 07/20/17 1210)  cefTRIAXone (ROCEPHIN) injection 250 mg (250 mg Intramuscular Given 07/20/17 1341)  azithromycin (ZITHROMAX) tablet 1,000 mg (1,000 mg Oral Given 07/20/17 1341)  lidocaine (XYLOCAINE) 1 % (with pres) injection (20 mLs  Given 07/20/17 1342)  ondansetron (ZOFRAN) injection 4 mg (4 mg Intravenous Given 07/20/17 1420)  metroNIDAZOLE (FLAGYL) tablet 2,000 mg (2,000 mg Oral Given 07/20/17 1420)     Initial Impression / Assessment and Plan / ED Course  I have reviewed the triage vital signs and the nursing  notes.  Pertinent labs & imaging results that were available during my care of the patient were reviewed by me and considered in my medical decision making (see chart for details).  Clinical Course as of Jul 20 1636  Thu Jul 20, 2017  1309 Appearance: (!) HAZY [CG]  1309 Hgb urine dipstick: Marland Kitchen)  MODERATE [CG]  1309 Leukocytes, UA: (!) MODERATE [CG]  1309 WBC, UA: TOO NUMEROUS TO COUNT [CG]  1309 Bacteria, UA: (!) RARE [CG]  1309 Squamous Epithelial / LPF: (!) 6-30 [CG]  1309 WBC: (!) 11.0 [CG]  1402 Trich, Wet Prep: (!) PRESENT [CG]    Clinical Course User Index [CG] Kinnie Feil, PA-C   41 year old female with history of hidradenitis presents for evaluation of worsening hydradenitis discharge, swelling, pain and vaginal discharge and dark urine. No fever here, but reports generalized malaise and subjective fever. On exam there are multiple open, actively draining nodules to groin but no abscess. No indication for I&D today. Copious amounts of white discharge in vaginal vault, no CMT or adnexal fullness or tenderness.   Lab work shows WBC 11 and UTI. +trich on wet prep. Urine preg negative and lactic acid normal. Pending GC/C, blood culture and urine culture. Pt has h/o of STDs in the past and high risk sexual behavior, given discharge on pelvic exam will treat empirically for GC/C and trich today. Patient considered safe for discharge at this time. Will d/c with keflex for UTI and doxycycline to cover for MRSA given h/o hidradenitis, she has been on doxy in the past with good response.   ED return precautions given. Advised to contact plastic surgery for re-evaluation regarding hidradenitis. Pt verbalized understanding and is agreeable with plan.   Final Clinical Impressions(s) / ED Diagnoses   Final diagnoses:  Vaginal discharge  Hidradenitis suppurativa    New Prescriptions Discharge Medication List as of 07/20/2017  2:34 PM       Kinnie Feil, PA-C 07/20/17 [place], DO 07/20/17 2043

## 2017-07-20 NOTE — Discharge Instructions (Signed)
You were evaluated today for worsening discharge from your groin, you were unable to determine if this was from your boils or vagina.    Based on exam and lab work, you have a sexually transmitted disease (trichomoniasis) and a urinary tract infection. Your gonorrhea and chlamydia testing is still pending. You were treated for gonorrhea, chlamydia and trichomoniasis today in the ED.  You will be discharged with antibiotic for urinary tract infection, this should also cover any possible initial abscess.  Contact your plastic/general surgeon for re-evaluation of your recent increase in discharge from boils, they may suggest longer duration of antibiotics or surgery.  Return to ED for worsening symptoms, fever, vaginal bleeding.

## 2017-07-20 NOTE — ED Triage Notes (Signed)
Pt reports abscess to vagina.

## 2017-07-21 LAB — URINE CULTURE

## 2017-07-21 LAB — GC/CHLAMYDIA PROBE AMP (~~LOC~~) NOT AT ARMC
Chlamydia: NEGATIVE
NEISSERIA GONORRHEA: NEGATIVE

## 2017-07-24 ENCOUNTER — Telehealth: Payer: Self-pay | Admitting: Podiatry

## 2017-07-24 ENCOUNTER — Encounter: Payer: Self-pay | Admitting: *Deleted

## 2017-07-24 NOTE — Telephone Encounter (Signed)
Dr. Amalia Hailey is supposed to be getting me scheduled for an MRI and I wasn't sure if I'm supposed to schedule it or your office is? I'm needing it done as soon as possible so that I can let my job know something. You can call me back at [phone]. Thank you.

## 2017-07-24 NOTE — Telephone Encounter (Signed)
I gave pt the contact appt line for [facility] [phone]. Pt states she also needs a note to give to [employer] so she can work. I told pt Dr. Amalia Hailey wanted her in the cam boot as much as possible and asked if she had seated duties. 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 I asked pt if she felt she could sit 15 minutes of every hour, pt states she could sit 15 minutes every 2 hours. I told pt I would draft a letter for her to pick up tomorrow, stating she must work in the cam boot, and she would need to sit 15 minutes every 2 hours. Pt states agreement.

## 2017-07-25 ENCOUNTER — Emergency Department (HOSPITAL_COMMUNITY): Payer: Medicaid Other

## 2017-07-25 ENCOUNTER — Encounter (HOSPITAL_COMMUNITY): Payer: Self-pay | Admitting: *Deleted

## 2017-07-25 DIAGNOSIS — F17210 Nicotine dependence, cigarettes, uncomplicated: Secondary | ICD-10-CM | POA: Insufficient documentation

## 2017-07-25 DIAGNOSIS — R079 Chest pain, unspecified: Secondary | ICD-10-CM | POA: Insufficient documentation

## 2017-07-25 DIAGNOSIS — B372 Candidiasis of skin and nail: Secondary | ICD-10-CM | POA: Insufficient documentation

## 2017-07-25 DIAGNOSIS — Z79899 Other long term (current) drug therapy: Secondary | ICD-10-CM | POA: Insufficient documentation

## 2017-07-25 DIAGNOSIS — I10 Essential (primary) hypertension: Secondary | ICD-10-CM | POA: Insufficient documentation

## 2017-07-25 DIAGNOSIS — Z872 Personal history of diseases of the skin and subcutaneous tissue: Secondary | ICD-10-CM | POA: Insufficient documentation

## 2017-07-25 LAB — CULTURE, BLOOD (ROUTINE X 2)
CULTURE: NO GROWTH
CULTURE: NO GROWTH
SPECIAL REQUESTS: ADEQUATE
Special Requests: ADEQUATE

## 2017-07-25 LAB — BASIC METABOLIC PANEL
ANION GAP: 6 (ref 5–15)
BUN: 13 mg/dL (ref 6–20)
CALCIUM: 9.4 mg/dL (ref 8.9–10.3)
CO2: 29 mmol/L (ref 22–32)
Chloride: 101 mmol/L (ref 101–111)
Creatinine, Ser: 1.09 mg/dL — ABNORMAL HIGH (ref 0.44–1.00)
GLUCOSE: 97 mg/dL (ref 65–99)
POTASSIUM: 3.4 mmol/L — AB (ref 3.5–5.1)
Sodium: 136 mmol/L (ref 135–145)

## 2017-07-25 LAB — CBC
HEMATOCRIT: 29.6 % — AB (ref 36.0–46.0)
HEMOGLOBIN: 9.7 g/dL — AB (ref 12.0–15.0)
MCH: 29.2 pg (ref 26.0–34.0)
MCHC: 32.8 g/dL (ref 30.0–36.0)
MCV: 89.2 fL (ref 78.0–100.0)
Platelets: 438 10*3/uL — ABNORMAL HIGH (ref 150–400)
RBC: 3.32 MIL/uL — AB (ref 3.87–5.11)
RDW: 14.4 % (ref 11.5–15.5)
WBC: 12.4 10*3/uL — ABNORMAL HIGH (ref 4.0–10.5)

## 2017-07-25 LAB — I-STAT TROPONIN, ED: TROPONIN I, POC: 0.01 ng/mL (ref 0.00–0.08)

## 2017-07-25 MED ORDER — OXYCODONE-ACETAMINOPHEN 5-325 MG PO TABS
ORAL_TABLET | ORAL | Status: AC
  Filled 2017-07-25: qty 1

## 2017-07-25 MED ORDER — OXYCODONE-ACETAMINOPHEN 5-325 MG PO TABS
1.0000 | ORAL_TABLET | Freq: Once | ORAL | Status: AC
  Administered 2017-07-25: 1 via ORAL

## 2017-07-25 MED ORDER — IBUPROFEN 400 MG PO TABS
400.0000 mg | ORAL_TABLET | Freq: Once | ORAL | Status: AC | PRN
  Administered 2017-07-25: 400 mg via ORAL

## 2017-07-25 MED ORDER — IBUPROFEN 400 MG PO TABS
ORAL_TABLET | ORAL | Status: AC
  Filled 2017-07-25: qty 1

## 2017-07-25 NOTE — ED Triage Notes (Signed)
Pt c/o abscess to vagina for several days with chest pain onset last night. Also reports L arm pain

## 2017-07-25 NOTE — ED Notes (Signed)
Pt st's she is in a lot of pain, st's Motrin did not help.  Pain meds given

## 2017-07-26 ENCOUNTER — Emergency Department (HOSPITAL_COMMUNITY)
Admission: EM | Admit: 2017-07-26 | Discharge: 2017-07-26 | Disposition: A | Discharge status: Home / Self Care | Payer: Medicaid Other | Attending: Emergency Medicine | Admitting: Emergency Medicine

## 2017-07-26 DIAGNOSIS — B372 Candidiasis of skin and nail: Secondary | ICD-10-CM

## 2017-07-26 DIAGNOSIS — Z872 Personal history of diseases of the skin and subcutaneous tissue: Secondary | ICD-10-CM

## 2017-07-26 MED ORDER — HYDROCODONE-ACETAMINOPHEN 5-325 MG PO TABS: 1.0000 | ORAL_TABLET | ORAL | 0 refills | Status: DC | PRN

## 2017-07-26 NOTE — ED Provider Notes (Signed)
[facility] DEPT Provider Note   CSN: [id number] Arrival date & time: 07/25/17  1926  By signing my name below, I, Ny'Kea Lewis, attest that this documentation has been prepared under the direction and in the presence of Veryl Speak, MD. Electronically Signed: Lise Auer, ED Scribe. 07/26/17. 4:00 AM.  History   Chief Complaint Chief Complaint  Patient presents with  . Abscess  . Chest Pain   The history is provided by the patient. No language interpreter was used.    HPI Comments: Rachel Mckinney is a 41 y.o. female with a PMHx of multiple chronic boils to the pelvic region, hidradenitis suppurativa, hypertension, and obesity, who presents to the Emergency Department complaining of gradually worsening, acute on chronic boils to her vagina for the past few days. Pt states she was seen in the ED on 8/4, for the same issue and since the boils have increased in size and she states they are more pain. She was placed on Keflex, and she states she has been compliant. She was also prescribed Tramadol, which she states does not help with her pain. She denies nausea, vomiting, fever, or abdominal pain.  Pt also reports having gradually worsening, persistent "pressure-like" centralized chest pain that radiates into her left arm that began five days ago. She denies a cardiac history. No treatment tried PTA.  She denies shortness of breath or cough.    Past Medical History:  Diagnosis Date  . Boils    under breast and inner thigh  . Hidradenitis suppurativa    Chronic problem for patient, multiple admissions for surgical drainage  . Hypertension    Patient Active Problem List   Diagnosis Date Noted  . Vaginal discharge 10/26/2016  . Possible exposure to STD 05/24/2016  . Trichomonal vaginitis 05/24/2016  . Abnormal uterine bleeding 03/02/2016  . Intertriginous candidiasis 01/26/2016  . Headache 01/26/2016  . Tobacco abuse 05/12/2015  . HTN (hypertension) 05/12/2015  . Menorrhagia  05/12/2015  . Preventative health care 09/03/2012  . Hidradenitis suppurativa-chronic and involving multiple sites 02/10/2012  . Morbid obesity ([place]) 11/16/2011    Past Surgical History:  Procedure Laterality Date  . CESAREAN SECTION    . multiple abscess drainages     OB History    Gravida Para Term Preterm AB Living   3       2 1    SAB TAB Ectopic Multiple Live Births           1     Home Medications    Prior to Admission medications   Medication Sig Start Date End Date Taking? Authorizing Provider  acetaminophen (TYLENOL) 500 MG tablet Take 1,000 mg by mouth every 6 (six) hours as needed for moderate pain.    [provider]  aspirin-acetaminophen-caffeine (EXCEDRIN MIGRAINE) [phone] MG tablet Take by mouth every 6 (six) hours as needed (pain).    [provider]  cephALEXin (KEFLEX) 500 MG capsule Take 1 capsule (500 mg total) by mouth 2 (two) times daily. 07/20/17   Kinnie Feil, PA-C  chlorhexidine (HIBICLENS) 4 % external liquid Apply topically daily as needed. Patient not taking: Reported on 05/08/2017 06/17/15   Coral Spikes, DO  Clotrimazole 1 % OINT Apply to affected areas twice daily. Patient not taking: Reported on 05/08/2017 06/17/15   Coral Spikes, DO  diclofenac (VOLTAREN) 75 MG EC tablet Take 1 tablet (75 mg total) by mouth 2 (two) times daily. 07/17/17   Edrick Kins, DPM  doxycycline (VIBRAMYCIN) 100  MG capsule Take 1 capsule (100 mg total) by mouth 2 (two) times daily. 07/20/17   Kinnie Feil, PA-C  ferrous sulfate 325 (65 FE) MG tablet Take 1 tablet (325 mg total) by mouth 3 (three) times daily with meals. Patient not taking: Reported on 07/20/2017 03/02/16   Virginia Crews, MD  hydrochlorothiazide (HYDRODIURIL) 25 MG tablet TAKE 1 TABLET (25 MG TOTAL) BY MOUTH DAILY. 03/01/17   Virginia Crews, MD  ibuprofen (ADVIL,MOTRIN) 600 MG tablet Take 1 tablet (600 mg total) by mouth every 6 (six) hours as needed. Patient not taking:  Reported on 07/20/2017 05/08/17   Nona Dell, PA-C  meloxicam (MOBIC) 15 MG tablet Take 1 tablet (15 mg total) by mouth daily. TAKE WITH MEALS Patient not taking: Reported on 07/20/2017 06/20/17   Street, [place], PA-C  OVER THE COUNTER MEDICATION Apply 1 application topically daily. Magnesium sulfate cream    [provider]  traMADol (ULTRAM) 50 MG tablet Take 1 tablet (50 mg total) by mouth every 8 (eight) hours as needed. 07/17/17   Edrick Kins, DPM   Family History Family History  Problem Relation Age of Onset  . Liver disease Father   . Hypertension Father   . Hypertension Sister    Social History Social History  Substance Use Topics  . Smoking status: Current Every Day Smoker    Packs/day: 0.30    Types: Cigarettes  . Smokeless tobacco: Never Used  . Alcohol use Yes     Comment: on weekends    Allergies   Patient has no known allergies.  Review of Systems Review of Systems  Constitutional: Negative for chills and fever.  Respiratory: Negative for cough and shortness of breath.   Cardiovascular: Positive for chest pain.  Gastrointestinal: Negative for abdominal pain, nausea and vomiting.  Skin: Positive for wound.  All other systems reviewed and are negative.   Physical Exam Updated Vital Signs BP 132/71 (BP Location: Right Arm)   Pulse 75   Temp 97.8 F (36.6 C) (Oral)   Resp 20   Ht 5\' 4"  (1.626 m)   Wt 248 lb (112.5 kg)   LMP 07/10/2017   SpO2 98%   BMI 42.57 kg/m   Physical Exam  Constitutional: She is oriented to person, place, and time. She appears well-developed and well-nourished. No distress.  HENT:  Head: Normocephalic and atraumatic.  Eyes: EOM are normal.  Neck: Normal range of motion.  Cardiovascular: Normal rate, regular rhythm and normal heart sounds.   Pulmonary/Chest: Effort normal and breath sounds normal.  Abdominal: Soft. She exhibits no distension. There is no tenderness.  Genitourinary:  Genitourinary  Comments: There a multiple areas of skin breakdown in the intertriginous areas of the groins and pannus. There is no fluctuance or areas that would be targets for drainage.   Musculoskeletal: Normal range of motion.  Neurological: She is alert and oriented to person, place, and time.  Skin: Skin is warm and dry.  Psychiatric: She has a normal mood and affect. Judgment normal.  Nursing note and vitals reviewed.  ED Treatments / Results  DIAGNOSTIC STUDIES: Oxygen Saturation is 98% on RA, normal by my interpretation.   COORDINATION OF CARE: 3:57 AM-Discussed next steps with pt. Pt verbalized understanding and is agreeable with the plan.   Labs (all labs ordered are listed, but only abnormal results are displayed) Labs Reviewed  BASIC METABOLIC PANEL - Abnormal; Notable for the following:       Result Value  Potassium 3.4 (*)    Creatinine, Ser 1.09 (*)    All other components within normal limits  CBC - Abnormal; Notable for the following:    WBC 12.4 (*)    RBC 3.32 (*)    Hemoglobin 9.7 (*)    HCT 29.6 (*)    Platelets 438 (*)    All other components within normal limits  I-STAT TROPONIN, ED    EKG  EKG Interpretation None      Radiology Dg Chest 2 View  Result Date: 07/25/2017 CLINICAL DATA:  Vaginal abscess EXAM: CHEST  2 VIEW COMPARISON:  10/13/2010 FINDINGS: The heart size and mediastinal contours are within normal limits. Both lungs are clear. The visualized skeletal structures are unremarkable. IMPRESSION: No active cardiopulmonary disease. Electronically Signed   By: Ashley Royalty M.D.   On: 07/25/2017 21:02   Procedures Procedures (including critical care time)  Medications Ordered in ED Medications  ibuprofen (ADVIL,MOTRIN) 400 MG tablet (not administered)  oxyCODONE-acetaminophen (PERCOCET/ROXICET) 5-325 MG per tablet (not administered)  ibuprofen (ADVIL,MOTRIN) tablet 400 mg (400 mg Oral Given 07/25/17 1944)  oxyCODONE-acetaminophen (PERCOCET/ROXICET) 5-325  MG per tablet 1 tablet (1 tablet Oral Given 07/25/17 2328)   Initial Impression / Assessment and Plan / ED Course  I have reviewed the triage vital signs and the nursing notes.  Pertinent labs & imaging results that were available during my care of the patient were reviewed by me and considered in my medical decision making (see chart for details).  Patient is a 41 year old female with history of hidradenitis presenting with complaints of source of the inside of her legs. She is currently on Keflex, however this is not helping. Her physical examination shows areas of breakdown in the intertrigo of both groins and pannus. There is one area of skin breakdown with granulation tissue, however no obvious abscess or fluctuance that would require incision and drainage.  I suspect her main issue this evening is local irritation and likely candida that is causing her discomfort. I explained to her that the best treatment for this is to dry the area and maintained good hygiene. She was also counseled that weight loss to cut down on the rubbing of the skin would be of benefit as well. She apparently did not like my advice as she made several derogatory remarks to the nurse as she was being discharged.   She was also complaining of "pain throughout her entire body", most notably her chest. She has no cardiac history and EKG and troponin this evening were negative with symptoms ongoing for several days. I highly doubt a cardiac etiology and do not feel as though any further workup is indicated into this.  She was prescribed medication for her pain and was advised to follow-up with dermatology if she continues to have ongoing issues. She has apparently had this condition for many years and has seen other doctors in [place]. I am uncertain as to the expectation she had this evening, however I see nothing emergent that requires incision and drainage or other specific treatment with the exception of what was  detailed above.  Final Clinical Impressions(s) / ED Diagnoses   Final diagnoses:  None    New Prescriptions New Prescriptions   No medications on file  I personally performed the services described in this documentation, which was scribed in my presence. The recorded information has been reviewed and is accurate.        Veryl Speak, MD 07/26/17 [phone]

## 2017-07-26 NOTE — Discharge Instructions (Signed)
Keep the area as clean and dry as possible.  Hydrocodone as prescribed as needed for pain.  Continue your Keflex as previously prescribed.  Follow-up with dermatology if symptoms are not improving. He should also follow-up with the surgeons who have cared for you in the past. I recommend calling in the morning to make these arrangements.

## 2017-07-26 NOTE — ED Notes (Signed)
Pt very unhappy with her dx/care. Pt states this "was a waste of 8 hrs" Pt unhappy with what EDP had to say. Explained to pt and her mother multiple times that pt had a full cardiac workup which was reassuring. Also explained to pt in regards to wound in vaginal area, there is no abscess so no indication for an I&D today. Pt needs to follow up with her PCP, her general surgeon, or called one of the two dermatologist we gave her a referral to. Pt still very ugly to this RN upon discharge. Refusing to sign e-signature.

## 2017-07-28 ENCOUNTER — Encounter: Payer: Self-pay | Admitting: *Deleted

## 2017-08-04 ENCOUNTER — Ambulatory Visit: Payer: Medicaid Other | Admitting: Internal Medicine

## 2017-08-04 ENCOUNTER — Ambulatory Visit (HOSPITAL_COMMUNITY)
Admission: RE | Admit: 2017-08-04 | Discharge: 2017-08-04 | Disposition: A | Discharge status: Home / Self Care | Payer: Self-pay | Source: Ambulatory Visit | Attending: Podiatry | Admitting: Podiatry

## 2017-08-04 ENCOUNTER — Ambulatory Visit (INDEPENDENT_AMBULATORY_CARE_PROVIDER_SITE_OTHER): Payer: Self-pay | Admitting: Family Medicine

## 2017-08-04 ENCOUNTER — Encounter: Payer: Self-pay | Admitting: Family Medicine

## 2017-08-04 VITALS — BP 139/79 | HR 93 | Temp 98.4°F | Wt 236.0 lb

## 2017-08-04 DIAGNOSIS — M7672 Peroneal tendinitis, left leg: Secondary | ICD-10-CM | POA: Insufficient documentation

## 2017-08-04 DIAGNOSIS — R600 Localized edema: Secondary | ICD-10-CM | POA: Insufficient documentation

## 2017-08-04 DIAGNOSIS — B372 Candidiasis of skin and nail: Secondary | ICD-10-CM

## 2017-08-04 DIAGNOSIS — L732 Hidradenitis suppurativa: Secondary | ICD-10-CM

## 2017-08-04 MED ORDER — CLOTRIMAZOLE 1 % EX CREA: 1.0000 "application " | TOPICAL_CREAM | Freq: Two times a day (BID) | CUTANEOUS | 0 refills | Status: DC

## 2017-08-04 MED ORDER — PREDNISONE 20 MG PO TABS: 40.0000 mg | ORAL_TABLET | Freq: Every day | ORAL | 0 refills | Status: AC

## 2017-08-04 MED ORDER — SENNOSIDES-DOCUSATE SODIUM 8.6-50 MG PO TABS: 1.0000 | ORAL_TABLET | Freq: Every day | ORAL | 0 refills | Status: DC

## 2017-08-04 MED ORDER — HYDROCODONE-ACETAMINOPHEN 5-325 MG PO TABS: 1.0000 | ORAL_TABLET | Freq: Four times a day (QID) | ORAL | 0 refills | Status: DC | PRN

## 2017-08-04 NOTE — Patient Instructions (Addendum)
It was great seeing you today! We have addressed the following issues today  1. You were seen here today for multiple abscesses secondary to Hidradentitis suppurativa. You will need urgent surgical consult for further evaluation 2. I prescribed steroids for the inflammation, make sure you take your antibiotics as prescribed. I will also prescribe Norco for the pain and give you a stool softener to avoid constipation. 3. I will also prescribe an antifungal cream for your gluteal cleft (backside). Keep dry in the future with powder  If we did any lab work today, and the results require attention, either me or my nurse will get in touch with you. If everything is normal, you will get a letter in mail and a message via . If you don't hear from [place] in two weeks, please give [place] a call. Otherwise, we look forward to seeing you again at your next visit. If you have any questions or concerns before then, please call the clinic at [phone].  Please bring all your medications to every doctors visit  Sign up for My Chart to have easy access to your labs results, and communication with your Primary care physician. Please ask Front Desk for some assistance.   Please check-out at the front desk before leaving the clinic.    Take Care,   Dr. Delfina Redwood Intertrigo is skin irritation or inflammation (dermatitis) that occurs when folds of skin rub together. The irritation can cause a rash and make skin raw and itchy. This condition most commonly occurs in the skin folds of these areas:  Toes.  Armpits.  Groin.  Belly.  Breasts.  Buttocks.  Intertrigo is not passed from person to person (is not contagious). What are the causes? This condition is caused by heat, moisture, friction, and lack of air circulation. The condition can be made worse by:  Sweat.  Bacteria or a fungus, such as yeast.  What increases the risk? This condition is more likely to occur if you have moisture in  your skin folds. It is also more likely to develop in people who:  Have diabetes.  Are overweight.  Are on bed rest.  Live in a warm and moist climate.  Wear splints, braces, or other medical devices.  Are not able to control their bowels or bladder (have incontinence).  What are the signs or symptoms? Symptoms of this condition include:  A pink or red skin rash.  Brown patches on the skin.  Raw or scaly skin.  Itchiness.  A burning feeling.  Bleeding.  Leaking fluid.  A bad smell.  How is this diagnosed? This condition is diagnosed with a medical history and physical exam. You may also have a skin swab to test for bacteria or a fungus, such as yeast. How is this treated? Treatment may include:  Cleaning and drying your skin.  An oral antibiotic medicine or antibiotic skin cream for a bacterial infection.  Antifungal cream or pills for an infection that was caused by a fungus, such as yeast.  Steroid ointment to relieve itchiness and irritation.  Follow these instructions at home:  Keep the affected area clean and dry.  Do not scratch your skin.  Stay in a cool environment as much as possible. Use an air conditioner or fan, if available.  Apply over-the-counter and prescription medicines only as told by your health care provider.  If you were prescribed an antibiotic medicine, use it as told by your health care provider. Do not stop  using the antibiotic even if your condition improves.  Keep all follow-up visits as told by your health care provider. This is important. How is this prevented?  Maintain a healthy weight.  Take care of your feet, especially if you have diabetes. Foot care includes: ? Wearing shoes that fit well. ? Keeping your feet dry. ? Wearing clean, breathable socks.  Protect the skin around your groin and buttocks, especially if you have incontinence. Skin protection includes: ? Following a regular cleaning routine. ? Using  moisturizers and skin protectants. ? Changing protection pads frequently.  Do not wear tight clothes. Wear clothes that are loose and absorbent. Wear clothes that are made of cotton.  Wear a bra that gives good support, if needed.  Shower and dry yourself thoroughly after activity. Use a hair dryer on a cool setting to dry between skin folds, especially after you bathe.  If you have diabetes, keep your blood sugar under control. Contact a health care provider if:  Your symptoms do not improve with treatment.  Your symptoms get worse or they spread.  You notice increased redness and warmth.  You have a fever. This information is not intended to replace advice given to you by your health care provider. Make sure you discuss any questions you have with your health care provider. Document Released: 12/05/2005 Document Revised: 05/12/2016 Document Reviewed: 06/08/2015 Elsevier Interactive Patient Education  2018 [employer]. Hidradenitis Suppurativa Hidradenitis suppurativa is a long-term (chronic) skin disease that starts with blocked sweat glands or hair follicles. Bacteria may grow in these blocked openings of your skin. Hidradenitis suppurativa is like a severe form of acne that develops in areas of your body where acne would be unusual. It is most likely to affect the areas of your body where skin rubs against skin and becomes moist. This includes your:  Underarms.  Groin.  Genital areas.  Buttocks.  Upper thighs.  Breasts.  Hidradenitis suppurativa may start out with small pimples. The pimples can develop into deep sores that break open (rupture) and drain pus. Over time your skin may thicken and become scarred. Hidradenitis suppurativa cannot be passed from person to person. What are the causes? The exact cause of hidradenitis suppurativa is not known. This condition may be due to:  Female and female hormones. The condition is rare before and after puberty.  An overactive  body defense system (immune system). Your immune system may overreact to the blocked hair follicles or sweat glands and cause swelling and pus-filled sores.  What increases the risk? You may have a higher risk of hidradenitis suppurativa if you:  Are a woman.  Are between ages 59 and 63.  Have a family history of hidradenitis suppurativa.  Have a personal history of acne.  Are overweight.  Smoke.  Take the drug lithium.  What are the signs or symptoms? The first signs of an outbreak are usually painful skin bumps that look like pimples. As the condition progresses:  Skin bumps may get bigger and grow deeper into the skin.  Bumps under the skin may rupture and drain smelly pus.  Skin may become itchy and infected.  Skin may thicken and scar.  Drainage may continue through tunnels under the skin (fistulas).  Walking and moving your arms can become painful.  How is this diagnosed? Your health care provider may diagnose hidradenitis suppurativa based on your medical history and your signs and symptoms. A physical exam will also be done. You may need to see a health  care provider who specializes in skin diseases (dermatologist). You may also have tests done to confirm the diagnosis. These can include:  Swabbing a sample of pus or drainage from your skin so it can be sent to the lab and tested for infection.  Blood tests to check for infection.  How is this treated? The same treatment will not work for everybody with hidradenitis suppurativa. Your treatment will depend on how severe your symptoms are. You may need to try several treatments to find what works best for you. Part of your treatment may include cleaning and bandaging (dressing) your wounds. You may also have to take medicines, such as the following:  Antibiotics.  Acne medicines.  Medicines to block or suppress the immune system.  A diabetes medicine (metformin) is sometimes used to treat this condition.  For  women, birth control pills can sometimes help relieve symptoms.  You may need surgery if you have a severe case of hidradenitis suppurativa that does not respond to medicine. Surgery may involve:  Using a laser to clear the skin and remove hair follicles.  Opening and draining deep sores.  Removing the areas of skin that are diseased and scarred.  Follow these instructions at home:  Learn as much as you can about your disease, and work closely with your health care providers.  Take medicines only as directed by your health care provider.  If you were prescribed an antibiotic medicine, finish it all even if you start to feel better.  If you are overweight, losing weight may be very helpful. Try to reach and maintain a healthy weight.  Do not use any tobacco products, including cigarettes, chewing tobacco, or electronic cigarettes. If you need help quitting, ask your health care provider.  Do not shave the areas where you get hidradenitis suppurativa.  Do not wear deodorant.  Wear loose-fitting clothes.  Try not to overheat and get sweaty.  Take a daily bleach bath as directed by your health care provider. ? Fill your bathtub halfway with water. ? Pour in  cup of unscented household bleach. ? Soak for 5-10 minutes.  Cover sore areas with a warm, clean washcloth (compress) for 5-10 minutes. Contact a health care provider if:  You have a flare-up of hidradenitis suppurativa.  You have chills or a fever.  You are having trouble controlling your symptoms at home. This information is not intended to replace advice given to you by your health care provider. Make sure you discuss any questions you have with your health care provider. Document Released: 07/19/2004 Document Revised: 05/12/2016 Document Reviewed: 03/07/2014 Elsevier Interactive Patient Education  2018 [employer].

## 2017-08-05 NOTE — Progress Notes (Signed)
Subjective:    Patient ID: Rachel Mckinney, female    DOB: 05-Jul-1976, 41 y.o.   MRN: [id number]   CC: Generalized weakness and fatigue  HPI: Patient is a 41 yo female who presents today complaining of generalized weakness and fatigue. Patient reports that she has not been feeling well for the past few days. She was recently seen in the ED was treated for UTI as well as Hidradenitis supp. Patient reports she has opened sores in her groin, vaginal and buttock region. Patient was prescribed doxycycline in the ED on 8/2 but has not taken it in yet (prescription filled on 8/8) because she was nauseated when she mixed it with  keflex that was prescribed for her UTI. Patient reports that since her last ED visit, sores have worsened and though she is on pain medications, there is minimal relief in her symptoms. Patient has a long history of hidradenitis supp which has required surgical intervention [facility] hospital) in the past. Patient endorses some chills unsure about fever, but denies abdominal pain, nausea and vomiting, diarhhea, chest pain, SOB.  Smoking status reviewed   ROS: all other systems were reviewed and are negative other than in the HPI   Past Medical History:  Diagnosis Date  . Boils    under breast and inner thigh  . Hidradenitis suppurativa    Chronic problem for patient, multiple admissions for surgical drainage  . Hypertension     Past Surgical History:  Procedure Laterality Date  . CESAREAN SECTION    . multiple abscess drainages      Past medical history, surgical, family, and social history reviewed and updated in the EMR as appropriate.  Objective:  BP 139/79   Pulse 93   Temp 98.4 F (36.9 C) (Oral)   Wt 236 lb (107 kg)   LMP 07/10/2017   SpO2 99%   BMI 40.51 kg/m   Vitals and nursing note reviewed  General: NAD, pleasant, able to participate in exam Cardiac: RRR, normal heart sounds, no murmurs. 2+ radial and PT pulses bilaterally Respiratory:  CTAB, normal effort, No wheezes, rales or rhonchi Abdomen: soft, nontender, nondistended, no hepatic or splenomegaly, +BS TL:XBWIOM genitalia: Vulva: vulvar lesion one large open lesion on the right groin/labia majora no drainage or bleeding noted. Vagina: not indicated Hurley stage II/III noted around left groin and buttocks. Multiple skin break in the gluteal cleft and left groin Extremities: no edema or cyanosis. WWP. Skin: warm and dry, no rashes noted Neuro: alert and oriented x4, no focal deficits Psych: Normal affect and mood   Assessment & Plan:   #Hidranenitis suppurativa, chronic, worsening Patient has an extensive history with surgical intervention in the past. Today presented with multiple open abscesses hurley stage II/III, endorsing pain. Patient has not taken doxycycline that was prescribed to her two weeks ago which has led to worsening clinical presentation. Patient does not appear toxic, but clearly uncomfortable likely secondary to pain. Lesions also noted in left groin fold and gluteal cleft with likely fungal infection. Patient is morbidly obese and body habitus have made keeping lesions clean and well treated difficult. Given history and extent and stage of her lesions, patient will need surgical intervention for likely I&D. Patient symptoms will be manage with close follow up with surgery. Patient has been reluctant to take pain meds due to history of constipation but will be started on bowel regimen. --Prescribed Norco 5-325 mg prn for pain --Prescribe Senokot to be taken while on Norco --Prednisone  40 mg daily for 5 days --Resume Doxycycline 100 mg bid for 10 days  --Clotrimazole Cream 1% apply bid to affected areas --Patient will arrange follow up with [facility] for consultation on 8/18. --Return precautions given and patient with good understanding of the plan. Work note was written for patient.   Marjie Skiff, MD [facility] PGY-2

## 2017-08-09 ENCOUNTER — Encounter: Payer: Medicaid Other | Admitting: Internal Medicine

## 2017-08-14 ENCOUNTER — Ambulatory Visit (INDEPENDENT_AMBULATORY_CARE_PROVIDER_SITE_OTHER): Payer: Self-pay | Admitting: Podiatry

## 2017-08-14 DIAGNOSIS — M7672 Peroneal tendinitis, left leg: Secondary | ICD-10-CM

## 2017-08-14 MED ORDER — BETAMETHASONE SOD PHOS & ACET 6 (3-3) MG/ML IJ SUSP: 3.0000 mg | Freq: Once | INTRAMUSCULAR | Status: DC

## 2017-08-14 NOTE — Progress Notes (Signed)
   HPI: patient presents today for follow-up treatment and evaluation regarding peroneal tendinitis to the left lower extremity. Patient states that she's feeling better and immobilization cam boot has helped.patient presents today to review MRI results. Patient also states that she recently had taken a prednisone pack for knee pain which also helped alleviate some symptoms to the left lower extremity.   Physical Exam: General: The patient is alert and oriented x3 in no acute distress.  Dermatology: Skin is warm, dry and supple bilateral lower extremities. Negative for open lesions or macerations.  Vascular: Palpable pedal pulses bilaterally. No edema or erythema noted. Capillary refill within normal limits.  Neurological: Epicritic and protective threshold grossly intact bilaterally.   Musculoskeletal Exam: Significant pain on palpation along the peroneal tendon sheath of the left lower extremity. Very suggestive of possible peroneal tendon tear. Range of motion within normal limits to all pedal and ankle joints bilateral. Muscle strength 5/5 in all groups bilateral.   Assessment: 1. Peroneal tendinitis left lower extremity   Plan of Care:  1. Patient was evaluated.MRI results reviewed today negative for any peroneal tear or tendinosis 2. Injection 0.5 mL Celestone Soluspan injected in the patient's left peroneal tendon sheath 3. Today an ankle brace was dispensed. Patient can begin to transition out of the immobilization cam boot. 4. Refill prescription for diclofenac 75 mg 5.Return to clinic in 4 weeks   Edrick Kins, DPM [facility]  Dr. Edrick Kins, DPM    [address], [place] [address]                Office [phone]  Fax [phone]

## 2017-08-25 ENCOUNTER — Telehealth: Payer: Self-pay | Admitting: Podiatry

## 2017-08-25 MED ORDER — DICLOFENAC SODIUM 75 MG PO TBEC: 75.0000 mg | DELAYED_RELEASE_TABLET | Freq: Two times a day (BID) | ORAL | 0 refills | Status: DC

## 2017-08-25 NOTE — Addendum Note (Signed)
Addended by: Harriett Sine D on: 08/25/2017 03:25 PM   Modules accepted: Orders

## 2017-08-25 NOTE — Telephone Encounter (Signed)
Yes, patient can return to work as tolerated. Return immediately full duty no restrictions.  Dr. Amalia Hailey

## 2017-08-25 NOTE — Telephone Encounter (Addendum)
I told pt I had reviewed Dr. Amalia Hailey last office note and saw that he was going to order Diclofenac, but I did not see any information concerning return to work. I told pt I refilled the diclofenac and would send him a note concerning the return to work, and have it ready if appropriate for her to pick up on 08/28/2017. Pt asked if he would put any restrictions because she is still having some pain with walking.08/28/2017-Left message informing pt of Dr. Amalia Hailey return to work orders and the letter could be picked up in the [place] office or faxed if she called with the information.08/31/2017-Pt states she would like return to work date as 09/11/2017, and will pick up in the [facility].

## 2017-08-25 NOTE — Telephone Encounter (Signed)
I'm calling because I need a note to return back to work. Also, when I saw Dr. Amalia Hailey last week he said he was going to prescribe me more antiinflammatory medication which he did not do. I was wondering if he is still going to do that? Please call me back at [phone]. Thank you.

## 2017-08-28 ENCOUNTER — Encounter: Payer: Self-pay | Admitting: *Deleted

## 2017-08-29 ENCOUNTER — Ambulatory Visit (INDEPENDENT_AMBULATORY_CARE_PROVIDER_SITE_OTHER): Payer: Self-pay | Admitting: Internal Medicine

## 2017-08-29 ENCOUNTER — Encounter: Payer: Self-pay | Admitting: Internal Medicine

## 2017-08-29 VITALS — BP 160/90 | HR 89 | Temp 98.5°F | Ht 64.0 in | Wt 241.6 lb

## 2017-08-29 DIAGNOSIS — L732 Hidradenitis suppurativa: Secondary | ICD-10-CM

## 2017-08-29 DIAGNOSIS — Z6841 Body Mass Index (BMI) 40.0 and over, adult: Secondary | ICD-10-CM

## 2017-08-29 LAB — POCT GLYCOSYLATED HEMOGLOBIN (HGB A1C): Hemoglobin A1C: 5.5

## 2017-08-29 NOTE — Patient Instructions (Signed)
It was nice meeting you today Ms. Vandenberg!  I strongly encourage you to pick up the Bactrim and begin taking every day for the next 30 days. You can take Tramadol as needed for pain. Try not to continue taking the Tylenol so regularly, as this can affect your kidneys.   Please continue using the clotrimazole as you have been.   If you have any questions or concerns, please feel free to call the clinic.   Be well,  Dr. Avon Gully

## 2017-08-29 NOTE — Progress Notes (Signed)
Subjective:   Patient: Rachel Mckinney       Birthdate: 1976/01/13       MRN: [id number]      HPI  Rachel Mckinney is a 41 y.o. female presenting for same day visit for hidradenitis.   Patient presents for hidradenitis. This is not a new issue, and has been worked up extensively. She presents today because she says her lesions are not healing and she doesn't know what to do.  Patient was seen on 08/24/17 by Dr. Eugene Garnet with surgery at [facility], per referral by Dr. Andy Gauss at patient's last visit. Per Dr. Ardath Sax notes, there was no need for drainage of any lesions at that visit, as all were draining. He also felt that patient was not a surgical candidate at this time, and strongly encouraged weight loss and smoking cessation. He prescribed one month of Bactrim DS 800-160 BID which patient has not picked up. Also felt that patient would benefit from Humira treatment, and referred to [facility] derm. Patient has appointment with Dr. Lonna Cobb in [place] on 10/15.  Patient was displeased with this visit, as she says that she has been on Bactrim in the past and it did not solve her problem. She was hoping for a surgical intervention, as this did provide relief for her for a while in the past. She says she has also been seen by dermatology before a few years ago and was only offered additional antibiotics.  Patient says that all current lesions are draining. One boil began draining yesterday. She says discharge initially had a foul odor. She has continued to use the clotrimazole cream for fungal infection under pannus since prescribed by Dr. Andy Gauss on 08/17.  Patient reports continued pain. Is requesting refill of Norco today, as she says this is all that has helped her. Per [place] Controlled Substance Database, patient has received Norco three times from three difference providers since July. She was last prescribed Norco on 08/17 at [facility] and has not received any refill or additional prescriptions from other  providers per database. She was not prescribed any pain medications by Dr. Eugene Garnet, as he told her she would need to be seen by her PCP for this. Patient says she has been taking extra strength Tylenol every day for months with no improvement in symptoms. Was prescribed Tramadol in July but says she did not take this for very long because it was not as effective as Norco. Also says that Tramadol gave her constipation. She was prescribed Senokot for constipation while on Norco and says this has worked very well.  Patient also frustrated that she has never been tested for diabetes. She says that providers consistently ask her if she has diabetes, and she has never been given an answer. She would like to be tested today.   Smoking status reviewed. Patient is current every day smoker.   Review of Systems See HPI.     Objective:  Physical Exam  Constitutional: She is oriented to person, place, and time and well-developed, well-nourished, and in no distress.  Obese female   HENT:  Head: Normocephalic and atraumatic.  Pulmonary/Chest: Effort normal. No respiratory distress.  Musculoskeletal: Normal range of motion.  Neurological: She is alert and oriented to person, place, and time.  Ambulates slowly reportedly due to pain. Able to climb onto and off of exam table without assistance.   Skin:  Multiple draining lesions consistent with suppurative hidradenitis located in inguinal area. None appear infected. No foul odor. Teresita  of inguinal region erythematous and tender to palpation. Skin otherwise warm and dry.   Psychiatric: Affect and judgment normal.      Assessment & Plan:  Hidradenitis suppurativa-chronic and involving multiple sites Had very long discussion with patient about her condition and the challenges presented to [place]. Discussed firstly that as she was seen by [facility] surgery five days ago and already has appointment with derm in one month, there is unfortunately nothing additional I  can offer her today. Per both surgery's evaluation and mine on exam today, no lesions in need of drainage, as all are already draining. Patient frustrated that she was placed on Bactrim again. Explained to patient that it is necessary to get current infections under control, and an extended course of antibiotics is first line treatment for such, as she has failed shorter courses of antibiotics. Acknowledged that she has been on Bactrim before without resolution of her symptoms, but stressed that longer course may have improved results. Patient also feels as if dermatology will not offer her anything other than additional antibiotics. Discussed that they have other options, including Humira, as skin lesions are their specialty. Stressed the importance of attending that visit, as the severity of her disease is out of the scope of family medicine at this point, and as surgery feels there is no need for surgical intervention. Regarding pain medication, patient requesting additional narcotics. Acknowledged patient's pain, however discussed that providing narcotics at this time is not the best care for her. Discussed that taking Extra Strength Tylenol multiple times a day for many months could cause liver damage, and recommended discontinuing or at least decreasing frequency of this. Discussed using Tramadol rather than Norco. Patient says she has some of this left and does not need another prescription. Encouraged her to continue using Senokot since she is concerned about constipation with Tramadol. Offered Senokot refill - patient says she has plenty and does not need additional refills. Patient requesting note to be excused from work for a couple days so she can try to dry out the lesions and help them heal by resting at home. Feel that this is appropriate and that a couple days of rest may significantly improve patient's current suffering, so provided work note for two days off work (return to work on Fri 09/14). It  does appear that patient receives work notes every time she comes to clinic, so would be hesitant to continue writing work excuses at future visits. Patient also requesting A1C today to rule out diabetes as cause of her symptoms, as this has been significant concern to her. A1C WNL. Patient to f/u with derm next month and return to [facility] as needed.    Adin Hector, MD, MPH PGY-3 [facility] Pager [phone]

## 2017-08-30 ENCOUNTER — Encounter: Payer: Self-pay | Admitting: Internal Medicine

## 2017-08-30 ENCOUNTER — Telehealth: Payer: Self-pay | Admitting: Podiatry

## 2017-08-30 NOTE — Telephone Encounter (Signed)
I'm calling back about getting a note to return to work. I spoke to someone last week and she stated she would have to talk to my doctor and call me back. I was just calling to see what the status is.

## 2017-08-30 NOTE — Telephone Encounter (Signed)
Left message informing pt the note Dr. Amalia Hailey stated she could return to full duty without restrictions, that I needed the date she would like to return and how she wanted the note delivered.

## 2017-08-30 NOTE — Assessment & Plan Note (Addendum)
Had very long discussion with patient about her condition and the challenges presented to [place]. Discussed firstly that as she was seen by [facility] surgery five days ago and already has appointment with derm in one month, there is unfortunately nothing additional I can offer her today. Per both surgery's evaluation and mine on exam today, no lesions in need of drainage, as all are already draining. Patient frustrated that she was placed on Bactrim again. Explained to patient that it is necessary to get current infections under control, and an extended course of antibiotics is first line treatment for such, as she has failed shorter courses of antibiotics. Acknowledged that she has been on Bactrim before without resolution of her symptoms, but stressed that longer course may have improved results. Patient also feels as if dermatology will not offer her anything other than additional antibiotics. Discussed that they have other options, including Humira, as skin lesions are their specialty. Stressed the importance of attending that visit, as the severity of her disease is out of the scope of family medicine at this point, and as surgery feels there is no need for surgical intervention. Regarding pain medication, patient requesting additional narcotics. Acknowledged patient's pain, however discussed that providing narcotics at this time is not the best care for her. Discussed that taking Extra Strength Tylenol multiple times a day for many months could cause liver damage, and recommended discontinuing or at least decreasing frequency of this. Discussed using Tramadol rather than Norco. Patient says she has some of this left and does not need another prescription. Encouraged her to continue using Senokot since she is concerned about constipation with Tramadol. Offered Senokot refill - patient says she has plenty and does not need additional refills. Patient requesting note to be excused from work for a couple days so she can  try to dry out the lesions and help them heal by resting at home. Feel that this is appropriate and that a couple days of rest may significantly improve patient's current suffering, so provided work note for two days off work (return to work on Fri 09/14). It does appear that patient receives work notes every time she comes to clinic, so would be hesitant to continue writing work excuses at future visits. Patient also requesting A1C today to rule out diabetes as cause of her symptoms, as this has been significant concern to her. A1C WNL. Patient to f/u with derm next month and return to [facility] as needed.

## 2017-09-01 ENCOUNTER — Encounter: Payer: Self-pay | Admitting: Podiatry

## 2017-09-06 ENCOUNTER — Telehealth: Payer: Self-pay | Admitting: *Deleted

## 2017-09-06 NOTE — Telephone Encounter (Signed)
Patient called requesting to speak with PCP regarding test results from last office visit.  Derl Barrow, RN

## 2017-09-07 NOTE — Telephone Encounter (Signed)
Called patient regarding A1C obtained at last visit. Discussed that it was normal. Patient asking if she should continue to be regularly checked for diabetes. Discussed that she does have increased risk factors for developing diabetes, specifically her race and obesity, however at this moment, focusing on weight management as well as BP control would be the best things for her health. She requested to schedule an appt with Dr. Jenne Campus. Provided her with Dr. Jenne Campus number and patient stated she would call to schedule an appt. Voiced appreciation for my phone call.   Adin Hector, MD, MPH PGY-3 [facility] Pager [phone]

## 2017-09-11 ENCOUNTER — Ambulatory Visit: Payer: Self-pay | Admitting: Podiatry

## 2017-12-27 DIAGNOSIS — M654 Radial styloid tenosynovitis [de Quervain]: Secondary | ICD-10-CM

## 2017-12-27 HISTORY — DX: Radial styloid tenosynovitis (de quervain): M65.4

## 2018-01-02 ENCOUNTER — Encounter (HOSPITAL_COMMUNITY): Payer: Self-pay

## 2018-01-02 ENCOUNTER — Emergency Department (HOSPITAL_COMMUNITY)
Admission: EM | Admit: 2018-01-02 | Discharge: 2018-01-02 | Disposition: A | Discharge status: Home / Self Care | Payer: BLUE CROSS/BLUE SHIELD | Attending: Emergency Medicine | Admitting: Emergency Medicine

## 2018-01-02 DIAGNOSIS — T7840XA Allergy, unspecified, initial encounter: Secondary | ICD-10-CM | POA: Diagnosis not present

## 2018-01-02 DIAGNOSIS — F17210 Nicotine dependence, cigarettes, uncomplicated: Secondary | ICD-10-CM | POA: Insufficient documentation

## 2018-01-02 DIAGNOSIS — Z79899 Other long term (current) drug therapy: Secondary | ICD-10-CM | POA: Diagnosis not present

## 2018-01-02 DIAGNOSIS — I10 Essential (primary) hypertension: Secondary | ICD-10-CM | POA: Insufficient documentation

## 2018-01-02 LAB — I-STAT BETA HCG BLOOD, ED (MC, WL, AP ONLY)

## 2018-01-02 MED ORDER — SODIUM CHLORIDE 0.9 % IV BOLUS (SEPSIS)
1000.0000 mL | Freq: Once | INTRAVENOUS | Status: AC
  Administered 2018-01-02: 1000 mL via INTRAVENOUS

## 2018-01-02 MED ORDER — METHYLPREDNISOLONE SODIUM SUCC 125 MG IJ SOLR
125.0000 mg | Freq: Once | INTRAMUSCULAR | Status: AC
  Administered 2018-01-02: 125 mg via INTRAVENOUS
  Filled 2018-01-02: qty 2

## 2018-01-02 MED ORDER — FAMOTIDINE IN NACL 20-0.9 MG/50ML-% IV SOLN
20.0000 mg | Freq: Once | INTRAVENOUS | Status: AC
  Administered 2018-01-02: 20 mg via INTRAVENOUS
  Filled 2018-01-02: qty 50

## 2018-01-02 MED ORDER — DIPHENHYDRAMINE HCL 50 MG/ML IJ SOLN
50.0000 mg | Freq: Once | INTRAMUSCULAR | Status: AC
  Administered 2018-01-02: 50 mg via INTRAVENOUS
  Filled 2018-01-02: qty 1

## 2018-01-02 MED ORDER — HYDROXYZINE HCL 25 MG PO TABS: 25.0000 mg | ORAL_TABLET | Freq: Three times a day (TID) | ORAL | 0 refills | Status: AC

## 2018-01-02 MED ORDER — RANITIDINE HCL 150 MG PO CAPS: 150.0000 mg | ORAL_CAPSULE | Freq: Two times a day (BID) | ORAL | 0 refills | Status: DC

## 2018-01-02 MED ORDER — EPINEPHRINE 0.3 MG/0.3ML IJ SOAJ: 0.3000 mg | Freq: Once | INTRAMUSCULAR | 0 refills | Status: DC | PRN

## 2018-01-02 MED ORDER — PREDNISONE 20 MG PO TABS: 60.0000 mg | ORAL_TABLET | Freq: Every day | ORAL | 0 refills | Status: AC

## 2018-01-02 NOTE — Discharge Instructions (Signed)
For now, I would avoid lettuce as well as any kind of shellfish.  You should follow-up with your doctor and have allergy testing in the next several weeks, after you have completed treatment for the current allergic reaction.  I have also prescribed an EpiPen.  Discussed this with the pharmacist.  She should use this if you develop worsening facial swelling that is limiting her ability to breathe, swallow, or other symptoms of severe, ongoing or worsening allergic reaction.  If you do use this, call 911 for transfer to the ER.

## 2018-01-02 NOTE — ED Triage Notes (Signed)
Pt states that noticed her lips and jaw selling a little last night, she took a benedryl then When she woke up this am, he lips were worse and she was itching on her stomach and arms Pt hasn't been taking any new meds, no new soaps or detergents Pt does say that she ate a shrimp salad last night but doesn't have a shrimp allergy

## 2018-01-02 NOTE — ED Provider Notes (Signed)
[facility] DEPT Provider Note   CSN: [id number] Arrival date & time: 01/02/18  0605     History   Chief Complaint Chief Complaint  Patient presents with  . Allergic Reaction    HPI Rachel Mckinney is a 42 y.o. female.  HPI  42 year old female with history of eczema here with facial swelling and diffuse rash.  The patient states she ate a shrimp salad last night.  She states it did not taste well so she did not eat much of it.  She started swelling in the left side of her mouth last night and took Benadryl which did slightly improve it.  She was itching throughout the night and had difficulty sleeping.  Upon awakening this morning, she is noticed worsening lip swelling, as well as subjective tongue swelling.  She reports she feels "fullness" when she swallows but denies any difficulty swallowing or shortness of breath.  She denies any wheezing.  Denies any cough.  No abdominal pain, nausea, or vomiting.  She has noticed a mild, erythematous rash on her trunk as well.  Denies any history of food allergies.  She is eating shellfish before without difficulty.  No new detergents or lotions.  No new soaps.  No other new exposures.   Past Medical History:  Diagnosis Date  . Boils    under breast and inner thigh  . Hidradenitis suppurativa    Chronic problem for patient, multiple admissions for surgical drainage  . Hypertension     Patient Active Problem List   Diagnosis Date Noted  . Vaginal discharge 10/26/2016  . Possible exposure to STD 05/24/2016  . Trichomonal vaginitis 05/24/2016  . Abnormal uterine bleeding 03/02/2016  . Intertriginous candidiasis 01/26/2016  . Headache 01/26/2016  . Tobacco abuse 05/12/2015  . HTN (hypertension) 05/12/2015  . Menorrhagia 05/12/2015  . Preventative health care 09/03/2012  . Hidradenitis suppurativa-chronic and involving multiple sites 02/10/2012  . Morbid obesity ([place]) 11/16/2011    Past Surgical History:   Procedure Laterality Date  . CESAREAN SECTION    . multiple abscess drainages      OB History    Gravida Para Term Preterm AB Living   3       2 1    SAB TAB Ectopic Multiple Live Births           1       Home Medications    Prior to Admission medications   Medication Sig Start Date End Date Taking? Authorizing Provider  clindamycin (CLEOCIN) 300 MG capsule Take 300 mg by mouth 2 (two) times daily. 12/20/17  Yes [provider]  dextromethorphan-guaiFENesin (MUCINEX DM) 30-600 MG 12hr tablet Take 1 tablet by mouth 2 (two) times daily as needed for cough.   Yes [provider]  diphenhydrAMINE (BENADRYL) 25 MG tablet Take 25 mg by mouth every 6 (six) hours as needed for allergies.   Yes [provider]  OVER THE COUNTER MEDICATION Apply 1 application topically daily. Magnesium sulfate cream   Yes [provider]  rifampin (RIFADIN) 300 MG capsule Take 300 mg by mouth daily. 12/20/17  Yes [provider]  spironolactone (ALDACTONE) 50 MG tablet Take 50 mg by mouth daily. 12/20/17  Yes [provider]  SSD 1 % cream Apply 1 application topically 2 (two) times daily. 12/20/17  Yes [provider]  cephALEXin (KEFLEX) 500 MG capsule Take 1 capsule (500 mg total) by mouth 2 (two) times daily. Patient not taking: Reported on  01/02/2018 07/20/17   Kinnie Feil, PA-C  chlorhexidine (HIBICLENS) 4 % external liquid Apply topically daily as needed. Patient not taking: Reported on 05/08/2017 06/17/15   Coral Spikes, DO  clotrimazole (LOTRIMIN) 1 % cream Apply 1 application topically 2 (two) times daily. Patient not taking: Reported on 01/02/2018 08/04/17   Marjie Skiff, MD  Clotrimazole 1 % OINT Apply to affected areas twice daily. Patient not taking: Reported on 05/08/2017 06/17/15   Coral Spikes, DO  diclofenac (VOLTAREN) 75 MG EC tablet Take 1 tablet (75 mg total) by mouth 2 (two) times daily. Patient not taking: Reported on  01/02/2018 08/25/17   Edrick Kins, DPM  doxycycline (VIBRAMYCIN) 100 MG capsule Take 1 capsule (100 mg total) by mouth 2 (two) times daily. Patient not taking: Reported on 01/02/2018 07/20/17   Kinnie Feil, PA-C  EPINEPHrine 0.3 mg/0.3 mL IJ SOAJ injection Inject 0.3 mLs (0.3 mg total) into the muscle once as needed for up to 1 dose (anaphylaxis (difficulty breathing or swallowing)). 01/02/18   Duffy Bruce, MD  ferrous sulfate 325 (65 FE) MG tablet Take 1 tablet (325 mg total) by mouth 3 (three) times daily with meals. Patient not taking: Reported on 07/20/2017 03/02/16   Virginia Crews, MD  hydrochlorothiazide (HYDRODIURIL) 25 MG tablet TAKE 1 TABLET (25 MG TOTAL) BY MOUTH DAILY. Patient not taking: Reported on 01/02/2018 03/01/17   Virginia Crews, MD  HYDROcodone-acetaminophen [facility]) 5-325 MG tablet Take 1-2 tablets by mouth every 4 (four) hours as needed. Patient not taking: Reported on 01/02/2018 07/26/17   Veryl Speak, MD  HYDROcodone-acetaminophen (NORCO) 5-325 MG tablet Take 1 tablet by mouth every 6 (six) hours as needed for moderate pain. Patient not taking: Reported on 01/02/2018 08/04/17   Marjie Skiff, MD  hydrOXYzine (ATARAX/VISTARIL) 25 MG tablet Take 1 tablet (25 mg total) by mouth 3 (three) times daily for 5 days. 01/02/18 01/07/18  Duffy Bruce, MD  ibuprofen (ADVIL,MOTRIN) 600 MG tablet Take 1 tablet (600 mg total) by mouth every 6 (six) hours as needed. Patient not taking: Reported on 07/20/2017 05/08/17   Nona Dell, PA-C  meloxicam (MOBIC) 15 MG tablet Take 1 tablet (15 mg total) by mouth daily. TAKE WITH MEALS Patient not taking: Reported on 07/20/2017 06/20/17   Street, [place], PA-C  predniSONE (DELTASONE) 20 MG tablet Take 3 tablets (60 mg total) by mouth daily for 5 days. 01/02/18 01/07/18  Duffy Bruce, MD  ranitidine (ZANTAC) 150 MG capsule Take 1 capsule (150 mg total) by mouth 2 (two) times daily for 5 days. 01/02/18 01/07/18  Duffy Bruce,  MD  senna-docusate (SENOKOT-S) 8.6-50 MG tablet Take 1 tablet by mouth daily. Patient not taking: Reported on 01/02/2018 08/04/17   Marjie Skiff, MD  traMADol (ULTRAM) 50 MG tablet Take 1 tablet (50 mg total) by mouth every 8 (eight) hours as needed. Patient not taking: Reported on 01/02/2018 07/17/17   Edrick Kins, DPM    Family History Family History  Problem Relation Age of Onset  . Liver disease Father   . Hypertension Father   . Hypertension Sister     Social History Social History   Tobacco Use  . Smoking status: Current Every Day Smoker    Packs/day: 0.30    Types: Cigarettes  . Smokeless tobacco: Never Used  Substance Use Topics  . Alcohol use: Yes    Comment: on weekends  . Drug use: Yes    Types: Marijuana    Comment: occas  use of marijuana     Allergies   Patient has no known allergies.   Review of Systems Review of Systems  Constitutional: Negative for chills and fever.  HENT: Positive for facial swelling. Negative for congestion, rhinorrhea and sore throat.   Eyes: Negative for visual disturbance.  Respiratory: Negative for cough, shortness of breath and wheezing.   Cardiovascular: Negative for chest pain and leg swelling.  Gastrointestinal: Negative for abdominal pain, diarrhea, nausea and vomiting.  Genitourinary: Negative for dysuria, flank pain, vaginal bleeding and vaginal discharge.  Musculoskeletal: Negative for neck pain.  Skin: Positive for rash.  Allergic/Immunologic: Negative for immunocompromised state.  Neurological: Negative for syncope and headaches.  Hematological: Does not bruise/bleed easily.  All other systems reviewed and are negative.    Physical Exam Updated Vital Signs BP (!) 148/89   Pulse 71   Temp 98.3 F (36.8 C)   Resp 16   SpO2 99%   Physical Exam  Constitutional: She is oriented to person, place, and time. She appears well-developed and well-nourished. No distress.  HENT:  Head: Normocephalic and  atraumatic.  Mouth/Throat: Oropharynx is clear and moist.  Moderate facial and lip edema with urticarial wheals across face. No tongue swelling. No uvular edema or peritonsillar edema.  Eyes: Conjunctivae are normal.  Neck: Neck supple.  No stridor, no neck fullness  Cardiovascular: Normal rate, regular rhythm and normal heart sounds. Exam reveals no friction rub.  No murmur heard. Pulmonary/Chest: Effort normal and breath sounds normal. No respiratory distress. She has no wheezes. She has no rales.  Abdominal: Soft. She exhibits no distension.  Musculoskeletal: She exhibits no edema.  Neurological: She is alert and oriented to person, place, and time. She exhibits normal muscle tone.  Skin: Skin is warm. Capillary refill takes less than 2 seconds. Rash (urticarial wheals on abdomen) noted.  Psychiatric: She has a normal mood and affect.  Nursing note and vitals reviewed.    ED Treatments / Results  Labs (all labs ordered are listed, but only abnormal results are displayed) Labs Reviewed  I-STAT BETA HCG BLOOD, ED ([facility], [facility], [facility] ONLY)    EKG  EKG Interpretation None       Radiology No results found.  Procedures Procedures (including critical care time)  Medications Ordered in ED Medications  sodium chloride 0.9 % bolus 1,000 mL (1,000 mLs Intravenous New Bag/Given 01/02/18 0734)  methylPREDNISolone sodium succinate (SOLU-MEDROL) 125 mg/2 mL injection 125 mg (125 mg Intravenous Given 01/02/18 0734)  diphenhydrAMINE (BENADRYL) injection 50 mg (50 mg Intravenous Given 01/02/18 0734)  famotidine (PEPCID) IVPB 20 mg premix (0 mg Intravenous Stopped 01/02/18 0804)     Initial Impression / Assessment and Plan / ED Course  I have reviewed the triage vital signs and the nursing notes.  Pertinent labs & imaging results that were available during my care of the patient were reviewed by me and considered in my medical decision making (see chart for details).    42 year old female  with past medical history as above here with allergic reaction.  I suspect this is due to shellfish or other possible food allergen.  She has no wheezing, shortness of breath, nausea, vomiting, or signs of secondary system involvement.  She has lip swelling but tongue is not overtly swollen and she has no pooling of secretions, stridor, or signs of airway compromise.  She was given IV steroids and Benadryl here with significant improvement in her symptoms.  She was monitored with no worsening of symptoms.  Given absence of second systems involvement or signs of anaphylaxis, will discharge with supportive care, steroids, and outpatient allergy referral.  Counseled patient on avoidance of possible triggers for now.  This note was prepared with assistance of Systems analyst. Occasional wrong-word or sound-a-like substitutions may have occurred due to the inherent limitations of voice recognition software.   Final Clinical Impressions(s) / ED Diagnoses   Final diagnoses:  Allergic reaction, initial encounter    ED Discharge Orders        Ordered    predniSONE (DELTASONE) 20 MG tablet  Daily     01/02/18 0828    ranitidine (ZANTAC) 150 MG capsule  2 times daily     01/02/18 0828    hydrOXYzine (ATARAX/VISTARIL) 25 MG tablet  3 times daily     01/02/18 0828    EPINEPHrine 0.3 mg/0.3 mL IJ SOAJ injection  Once PRN     01/02/18 3567       Duffy Bruce, MD 01/02/18 0830

## 2018-01-19 ENCOUNTER — Encounter: Payer: Self-pay | Admitting: Internal Medicine

## 2018-01-29 ENCOUNTER — Ambulatory Visit (HOSPITAL_COMMUNITY)
Admission: EM | Admit: 2018-01-29 | Discharge: 2018-01-29 | Disposition: A | Discharge status: Home / Self Care | Payer: BLUE CROSS/BLUE SHIELD | Attending: Family Medicine | Admitting: Family Medicine

## 2018-01-29 ENCOUNTER — Encounter (HOSPITAL_COMMUNITY): Payer: Self-pay | Admitting: Emergency Medicine

## 2018-01-29 DIAGNOSIS — N764 Abscess of vulva: Secondary | ICD-10-CM

## 2018-01-29 DIAGNOSIS — L0291 Cutaneous abscess, unspecified: Secondary | ICD-10-CM

## 2018-01-29 MED ORDER — LIDOCAINE-EPINEPHRINE (PF) 2 %-1:200000 IJ SOLN
INTRAMUSCULAR | Status: AC
  Filled 2018-01-29: qty 20

## 2018-01-29 MED ORDER — MELOXICAM 7.5 MG PO TABS: 7.5000 mg | ORAL_TABLET | Freq: Every day | ORAL | 0 refills | Status: DC

## 2018-01-29 MED ORDER — HYDROCODONE-ACETAMINOPHEN 5-325 MG PO TABS: 1.0000 | ORAL_TABLET | Freq: Four times a day (QID) | ORAL | 0 refills | Status: DC | PRN

## 2018-01-29 NOTE — ED Triage Notes (Signed)
PT reports abscess near vagina that started yesterday. PT reports it is draining.

## 2018-01-29 NOTE — ED Provider Notes (Signed)
[facility]    CSN: [id number] Arrival date & time: 01/29/18  1308     History   Chief Complaint Chief Complaint  Patient presents with  . Abscess    HPI Rachel Mckinney is a 42 y.o. female.   42 year old female with history of hidradenitis suppurativa comes in for 2-day history of abscess to the vaginal area.  States that she was seeing a Psychiatric nurse for hydradenitis, who then referred her to a dermatologist.  The dermatologist put her on clindamycin, rifampin, Spironolactone, Silvadene and has had good control of her symptoms.  States current abscess is the first 1 in a long time.  Patient states she has had surgery to the groin area due to hidradenitis, so appearance can be different, but should be symmetrical, but left labia is completely swollen with tenderness to the inferior site. Denies fever, chills, night sweats.  Has continued all her medications as directed.  Had heat compress, and reports some self drainage.  Followed up due to continued swelling and pain. She has a dermatologist appointment on 02/06/2018.       Past Medical History:  Diagnosis Date  . Boils    under breast and inner thigh  . Hidradenitis suppurativa    Chronic problem for patient, multiple admissions for surgical drainage  . Hypertension     Patient Active Problem List   Diagnosis Date Noted  . Vaginal discharge 10/26/2016  . Possible exposure to STD 05/24/2016  . Trichomonal vaginitis 05/24/2016  . Abnormal uterine bleeding 03/02/2016  . Intertriginous candidiasis 01/26/2016  . Headache 01/26/2016  . Tobacco abuse 05/12/2015  . HTN (hypertension) 05/12/2015  . Menorrhagia 05/12/2015  . Preventative health care 09/03/2012  . Hidradenitis suppurativa-chronic and involving multiple sites 02/10/2012  . Morbid obesity ([place]) 11/16/2011    Past Surgical History:  Procedure Laterality Date  . CESAREAN SECTION    . multiple abscess drainages      OB History    Gravida Para  Term Preterm AB Living   3       2 1    SAB TAB Ectopic Multiple Live Births           1       Home Medications    Prior to Admission medications   Medication Sig Start Date End Date Taking? Authorizing Provider  spironolactone (ALDACTONE) 50 MG tablet Take 50 mg by mouth daily. 12/20/17  Yes [provider]  cephALEXin (KEFLEX) 500 MG capsule Take 1 capsule (500 mg total) by mouth 2 (two) times daily. Patient not taking: Reported on 01/02/2018 07/20/17   Kinnie Feil, PA-C  chlorhexidine (HIBICLENS) 4 % external liquid Apply topically daily as needed. Patient not taking: Reported on 05/08/2017 06/17/15   Coral Spikes, DO  clindamycin (CLEOCIN) 300 MG capsule Take 300 mg by mouth 2 (two) times daily. 12/20/17   [provider]  clotrimazole (LOTRIMIN) 1 % cream Apply 1 application topically 2 (two) times daily. Patient not taking: Reported on 01/02/2018 08/04/17   Marjie Skiff, MD  Clotrimazole 1 % OINT Apply to affected areas twice daily. Patient not taking: Reported on 05/08/2017 06/17/15   Coral Spikes, DO  diphenhydrAMINE (BENADRYL) 25 MG tablet Take 25 mg by mouth every 6 (six) hours as needed for allergies.    [provider]  doxycycline (VIBRAMYCIN) 100 MG capsule Take 1 capsule (100 mg total) by mouth 2 (two) times daily. Patient not taking: Reported on 01/02/2018 07/20/17   Donney Rankins,  Orson Gear, PA-C  EPINEPHrine 0.3 mg/0.3 mL IJ SOAJ injection Inject 0.3 mLs (0.3 mg total) into the muscle once as needed for up to 1 dose (anaphylaxis (difficulty breathing or swallowing)). 01/02/18   Duffy Bruce, MD  hydrochlorothiazide (HYDRODIURIL) 25 MG tablet TAKE 1 TABLET (25 MG TOTAL) BY MOUTH DAILY. Patient not taking: Reported on 01/02/2018 03/01/17   Virginia Crews, MD  HYDROcodone-acetaminophen (NORCO/VICODIN) 5-325 MG tablet Take 1 tablet by mouth every 6 (six) hours as needed. 01/29/18   Tasia Catchings, Amy V, PA-C  meloxicam (MOBIC) 7.5 MG tablet Take 1 tablet (7.5 mg  total) by mouth daily. 01/29/18   Tasia Catchings, Amy V, PA-C  OVER THE COUNTER MEDICATION Apply 1 application topically daily. Magnesium sulfate cream    [provider]  rifampin (RIFADIN) 300 MG capsule Take 300 mg by mouth daily. 12/20/17   [provider]  SSD 1 % cream Apply 1 application topically 2 (two) times daily. 12/20/17   [provider]    Family History Family History  Problem Relation Age of Onset  . Liver disease Father   . Hypertension Father   . Hypertension Sister     Social History Social History   Tobacco Use  . Smoking status: Current Every Day Smoker    Packs/day: 0.30    Types: Cigarettes  . Smokeless tobacco: Never Used  Substance Use Topics  . Alcohol use: Yes    Comment: on weekends  . Drug use: Yes    Types: Marijuana    Comment: occas use of marijuana     Allergies   Patient has no known allergies.   Review of Systems Review of Systems  Reason unable to perform ROS: See HPI as above.     Physical Exam Triage Vital Signs ED Triage Vitals  Enc Vitals Group     BP 01/29/18 1428 (!) 142/83     Pulse Rate 01/29/18 1426 81     Resp 01/29/18 1426 16     Temp 01/29/18 1426 98.5 F (36.9 C)     Temp Source 01/29/18 1426 Oral     SpO2 01/29/18 1426 100 %     Weight 01/29/18 1423 242 lb (109.8 kg)     Height 01/29/18 1423 5\' 5"  (1.651 m)     Head Circumference --      Peak Flow --      Pain Score 01/29/18 1423 10     Pain Loc --      Pain Edu? --      Excl. in [place]? --    No data found.  Updated Vital Signs BP (!) 142/83   Pulse 81   Temp 98.5 F (36.9 C) (Oral)   Resp 16   Ht 5\' 5"  (1.651 m)   Wt 242 lb (109.8 kg)   LMP 01/19/2018   SpO2 100%   BMI 40.27 kg/m   Physical Exam  Constitutional: She is oriented to person, place, and time. She appears well-developed and well-nourished. No distress.  HENT:  Head: Normocephalic and atraumatic.  Eyes: Conjunctivae are normal. Pupils are equal, round, and reactive to  light.  Genitourinary:  Genitourinary Comments: Diffuse swelling, erythema of the left labia. No obvious increased warmth. Tenderness on palpation of the inferior labia. Fluctuance felt at the inferior labia.   Neurological: She is alert and oriented to person, place, and time.     UC Treatments / Results  Labs (all labs ordered are listed, but only abnormal results are displayed)  Labs Reviewed - No data to display  EKG  EKG Interpretation None       Radiology No results found.  Procedures Incision and Drainage Date/Time: 01/29/2018 3:31 PM Performed by: Ok Edwards, PA-C Authorized by: Vanessa Kick, MD   Consent:    Consent obtained:  Verbal   Consent given by:  Patient   Risks discussed:  Bleeding, incomplete drainage, infection and pain   Alternatives discussed:  Alternative treatment and referral Location:    Type:  Abscess   Size:  7cm x 3 cm   Location:  Anogenital   Anogenital location:  [place] Pre-procedure details:    Skin preparation:  Betadine Anesthesia (see MAR for exact dosages):    Anesthesia method:  Local infiltration   Local anesthetic:  Lidocaine 2% WITH epi Procedure type:    Complexity:  Simple Procedure details:    Needle aspiration: no     Incision types:  Single straight   Incision depth:  Dermal   Scalpel blade:  11   Wound management:  Probed and deloculated   Drainage:  Serosanguinous   Drainage amount:  Moderate   Wound treatment:  Wound left open   Packing materials:  None Post-procedure details:    Patient tolerance of procedure:  Tolerated well, no immediate complications    (including critical care time)  Medications Ordered in UC Medications - No data to display   Initial Impression / Assessment and Plan / UC Course  I have reviewed the triage vital signs and the nursing notes.  Pertinent labs & imaging results that were available during my care of the patient were reviewed by me and considered in my medical decision  making (see chart for details).    Discussed case with Dr. Mannie Stabile, who also examined patient.  Diffuse swelling and erythema of the left labia with tenderness to palpation at the inferior labia.  Mild fluctuance of the inferior labia.  Discussed with patient options of treatments, including pain management today with follow-up to her plastic surgeon or dermatologist for further management, I&D today with follow-up to her plastic surgeon/dermatologist.  Patient would like to attempt I&D of the inferior labia due to tenderness/pain at that location. Patient tolerated I&D well, with serosanguinous drainage. No purulent drainage. Discussed attempting drainage on mid labia, which patient deferred for now. Will start mobic for pain, norco for breakthrough pain. Patient to follow up with plastic surgeon and/or dermatologist tomorrow for further evaluation. Return precautions given. Patient expresses understanding and agrees to plan.   Final Clinical Impressions(s) / UC Diagnoses   Final diagnoses:  Abscess    ED Discharge Orders        Ordered    meloxicam (MOBIC) 7.5 MG tablet  Daily     01/29/18 1525    HYDROcodone-acetaminophen (NORCO/VICODIN) 5-325 MG tablet  Every 6 hours PRN     01/29/18 1525       Controlled Substance Prescriptions [place] Controlled Substance Registry consulted? Yes, I have consulted the [place] Controlled Substances Registry for this patient, and feel the risk/benefit ratio today is favorable for proceeding with this prescription for a controlled substance.   Ok Edwards, PA-C 01/29/18 1727

## 2018-01-29 NOTE — ED Notes (Signed)
PT on two antibiotics a day for hydronitis

## 2018-01-29 NOTE — Discharge Instructions (Signed)
Mobic for pain. Norco for break through pain. Sitz baths. Continue clindamycin, rifampin, spironolactone as directed. Please contact your dermatologist and plastic surgeon for follow up tomorrow for further evaluation and treatment needed.

## 2018-03-01 ENCOUNTER — Ambulatory Visit (HOSPITAL_COMMUNITY)
Admission: EM | Admit: 2018-03-01 | Discharge: 2018-03-01 | Disposition: A | Discharge status: Home / Self Care | Payer: BLUE CROSS/BLUE SHIELD | Attending: Internal Medicine | Admitting: Internal Medicine

## 2018-03-01 ENCOUNTER — Encounter (HOSPITAL_COMMUNITY): Payer: Self-pay | Admitting: Family Medicine

## 2018-03-01 DIAGNOSIS — M79631 Pain in right forearm: Secondary | ICD-10-CM | POA: Diagnosis not present

## 2018-03-01 DIAGNOSIS — M659 Synovitis and tenosynovitis, unspecified: Secondary | ICD-10-CM

## 2018-03-01 DIAGNOSIS — M65841 Other synovitis and tenosynovitis, right hand: Secondary | ICD-10-CM

## 2018-03-01 MED ORDER — HYDROCODONE-ACETAMINOPHEN 5-325 MG PO TABS: 1.0000 | ORAL_TABLET | Freq: Four times a day (QID) | ORAL | 0 refills | Status: DC | PRN

## 2018-03-01 MED ORDER — PREDNISONE 20 MG PO TABS: 40.0000 mg | ORAL_TABLET | Freq: Every day | ORAL | 0 refills | Status: AC

## 2018-03-01 NOTE — ED Provider Notes (Signed)
[place]    CSN: [id number] Arrival date & time: 03/01/18  1247     History   Chief Complaint Chief Complaint  Patient presents with  . Wrist Pain  . Elbow Pain    HPI Rachel Mckinney is a 42 y.o. female.   She had a steroid injection for de Quervain's tenosynovitis in January with Dr. Fredna Dow, and her symptoms subsided.  A couple weeks ago, she opened a tight pill bottle, and has been having increasing pain, with some swelling and stiffness, at the base of the right thumb and radiating up the ventral forearm to the medial elbow.  The last 2 days pain has been particularly severe, and she cannot stand it.  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  Uses her arms and hands all the time.  Not able to wear a splint at work.    HPI  Past Medical History:  Diagnosis Date  . Boils    under breast and inner thigh  . Hidradenitis suppurativa    Chronic problem for patient, multiple admissions for surgical drainage  . Hypertension     Patient Active Problem List   Diagnosis Date Noted  . Vaginal discharge 10/26/2016  . Possible exposure to STD 05/24/2016  . Trichomonal vaginitis 05/24/2016  . Abnormal uterine bleeding 03/02/2016  . Intertriginous candidiasis 01/26/2016  . Headache 01/26/2016  . Tobacco abuse 05/12/2015  . HTN (hypertension) 05/12/2015  . Menorrhagia 05/12/2015  . Preventative health care 09/03/2012  . Hidradenitis suppurativa-chronic and involving multiple sites 02/10/2012  . Morbid obesity ([facility]) 11/16/2011    Past Surgical History:  Procedure Laterality Date  . CESAREAN SECTION    . multiple abscess drainages      OB History    Gravida Para Term Preterm AB Living   3       2 1    SAB TAB Ectopic Multiple Live Births           1       Home Medications    Prior to Admission medications   Medication Sig Start Date End Date Taking? Authorizing Provider  diphenhydrAMINE (BENADRYL) 25 MG tablet Take 25 mg by mouth every  6 (six) hours as needed for allergies.    [provider]  EPINEPHrine 0.3 mg/0.3 mL IJ SOAJ injection Inject 0.3 mLs (0.3 mg total) into the muscle once as needed for up to 1 dose (anaphylaxis (difficulty breathing or swallowing)). 01/02/18   Duffy Bruce, MD  hydrochlorothiazide (HYDRODIURIL) 25 MG tablet TAKE 1 TABLET (25 MG TOTAL) BY MOUTH DAILY. Patient not taking: Reported on 01/02/2018 03/01/17   Virginia Crews, MD  HYDROcodone-acetaminophen (NORCO/VICODIN) 5-325 MG tablet Take 1 tablet by mouth 4 (four) times daily as needed. 03/01/18   Wynona Luna, MD  OVER THE COUNTER MEDICATION Apply 1 application topically daily. Magnesium sulfate cream    [provider]  predniSONE (DELTASONE) 20 MG tablet Take 2 tablets (40 mg total) by mouth daily with breakfast for 5 days. 03/01/18 03/06/18  Wynona Luna, MD  rifampin (RIFADIN) 300 MG capsule Take 300 mg by mouth daily. 12/20/17   [provider]  spironolactone (ALDACTONE) 50 MG tablet Take 50 mg by mouth daily. 12/20/17   [provider]  SSD 1 % cream Apply 1 application topically 2 (two) times daily. 12/20/17   [provider]    Family History Family History  Problem Relation Age of Onset  .  Liver disease Father   . Hypertension Father   . Hypertension Sister     Social History Social History   Tobacco Use  . Smoking status: Current Every Day Smoker    Packs/day: 0.30    Types: Cigarettes  . Smokeless tobacco: Never Used  Substance Use Topics  . Alcohol use: Yes    Comment: on weekends  . Drug use: Yes    Types: Marijuana    Comment: occas use of marijuana     Allergies   Patient has no known allergies.   Review of Systems Review of Systems  All other systems reviewed and are negative.    Physical Exam Triage Vital Signs ED Triage Vitals  Enc Vitals Group     BP 03/01/18 1347 (!) 142/65     Pulse Rate 03/01/18 1347 85     Resp 03/01/18 1347 18      Temp --      Temp src --      SpO2 03/01/18 1347 100 %     Weight --      Height --      Pain Score 03/01/18 1345 10     Pain Loc --    Updated Vital Signs BP (!) 142/65   Pulse 85   Resp 18   LMP 02/22/2018   SpO2 100%  Physical Exam  Constitutional: She is oriented to person, place, and time. No distress.  HENT:  Head: Atraumatic.  Eyes:  Conjugate gaze observed, no eye redness/discharge  Neck: Neck supple.  Cardiovascular: Normal rate.  Pulmonary/Chest: No respiratory distress.  Abdominal: She exhibits no distension.  Musculoskeletal: Normal range of motion.  Painful to fully flex the right elbow and very painful to move the right wrist and thumb.  Some tenderness to touch over the course of the thumb extensor tendon, and along the central and medial aspect of the forearm.  Subjective swelling. No erythema.  Skin intact.  No bruising.    Neurological: She is alert and oriented to person, place, and time.  Skin: Skin is warm and dry.  Nursing note and vitals reviewed.    UC Treatments / Results   Procedures Procedures (including critical care time) None today  Final Clinical Impressions(s) / UC Diagnoses   Final diagnoses:  Tenosynovitis of right wrist  Right forearm pain   Prescriptions for prednisone (to help with inflammation right wrist and forearm) and a small number of vicodin (for emergency pain) were sent to the pharmacy.  Note for work, return to work 3/17. Keep followup appointment with Dr Luvenia Starch doctor next week to discuss further options.    ED Discharge Orders        Ordered    predniSONE (DELTASONE) 20 MG tablet  Daily with breakfast     03/01/18 1433    HYDROcodone-acetaminophen (NORCO/VICODIN) 5-325 MG tablet  4 times daily PRN     03/01/18 1434       Controlled Substance Prescriptions [place] Controlled Substance Registry consulted? Yes, I have consulted the [place] Controlled Substances Registry for this patient, and feel the risk/benefit ratio  today is favorable for proceeding with this prescription for a controlled substance.   Wynona Luna, MD 03/03/18 [phone]

## 2018-03-01 NOTE — ED Triage Notes (Signed)
Pt here for evaluation of right wrist and elbow pain that has been ongoing but this am became worse to the point she cant bend arm or flex her right thumb. [facility] She has been wearing a splint periodically. taking  ibuprofen and tylenol for pain. 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

## 2018-03-01 NOTE — Discharge Instructions (Addendum)
Prescriptions for prednisone (to help with inflammation right wrist and forearm) and a small number of vicodin (for emergency pain) were sent to the pharmacy.  Note for work, return to work 3/17. Keep followup appointment with Dr Luvenia Starch doctor next week to discuss further options.

## 2018-03-19 DIAGNOSIS — M25521 Pain in right elbow: Secondary | ICD-10-CM

## 2018-03-19 HISTORY — DX: Pain in right elbow: M25.521

## 2018-03-28 ENCOUNTER — Encounter: Payer: Self-pay | Admitting: Family Medicine

## 2018-03-28 ENCOUNTER — Other Ambulatory Visit (HOSPITAL_COMMUNITY)
Admission: RE | Admit: 2018-03-28 | Discharge: 2018-03-28 | Disposition: A | Discharge status: Home / Self Care | Payer: BLUE CROSS/BLUE SHIELD | Source: Ambulatory Visit | Attending: Family Medicine | Admitting: Family Medicine

## 2018-03-28 ENCOUNTER — Ambulatory Visit (INDEPENDENT_AMBULATORY_CARE_PROVIDER_SITE_OTHER): Payer: BLUE CROSS/BLUE SHIELD | Admitting: Family Medicine

## 2018-03-28 ENCOUNTER — Other Ambulatory Visit: Payer: Self-pay

## 2018-03-28 DIAGNOSIS — Z202 Contact with and (suspected) exposure to infections with a predominantly sexual mode of transmission: Secondary | ICD-10-CM | POA: Insufficient documentation

## 2018-03-28 NOTE — Patient Instructions (Signed)
I will call with lab test results.

## 2018-03-29 ENCOUNTER — Encounter: Payer: Self-pay | Admitting: Family Medicine

## 2018-03-29 LAB — HIV ANTIBODY (ROUTINE TESTING W REFLEX): HIV SCREEN 4TH GENERATION: NONREACTIVE

## 2018-03-29 LAB — RPR: RPR: NONREACTIVE

## 2018-03-29 NOTE — Progress Notes (Signed)
   Subjective:    Patient ID: Rachel Mckinney, female    DOB: 08/28/1976, 42 y.o.   MRN: [id number]  HPI  Wants to be checked for infections due to derm starting her on humira for hidradenitis suppurativa.   Already had neg TB skin test.   Healthy and asymptomatic.  No cough, GI sx, wt loss, unexplained fever.   She is monagamous.  However recently acquired trich from her partner.  As such, she is at risk for STD.  Up to date on pap.  No vag DC    Review of Systems     Objective:   Physical Exam VS noted Lungs clear Cardiac RRR without m or g Abd benign.       Assessment & Plan:

## 2018-03-29 NOTE — Assessment & Plan Note (Signed)
Check RPR, HIV, GC and chlamydia.

## 2018-03-30 LAB — URINE CYTOLOGY ANCILLARY ONLY
Chlamydia: NEGATIVE
NEISSERIA GONORRHEA: NEGATIVE

## 2018-06-19 ENCOUNTER — Encounter: Payer: Self-pay | Admitting: Family Medicine

## 2018-06-19 ENCOUNTER — Other Ambulatory Visit: Payer: Self-pay

## 2018-06-19 ENCOUNTER — Other Ambulatory Visit (HOSPITAL_COMMUNITY)
Admission: RE | Admit: 2018-06-19 | Discharge: 2018-06-19 | Disposition: A | Discharge status: Home / Self Care | Payer: BLUE CROSS/BLUE SHIELD | Source: Ambulatory Visit | Attending: Family Medicine | Admitting: Family Medicine

## 2018-06-19 ENCOUNTER — Ambulatory Visit (INDEPENDENT_AMBULATORY_CARE_PROVIDER_SITE_OTHER): Payer: BLUE CROSS/BLUE SHIELD | Admitting: Family Medicine

## 2018-06-19 VITALS — BP 130/82 | HR 88 | Temp 98.1°F | Ht 65.0 in | Wt 252.0 lb

## 2018-06-19 DIAGNOSIS — Z114 Encounter for screening for human immunodeficiency virus [HIV]: Secondary | ICD-10-CM

## 2018-06-19 DIAGNOSIS — Z113 Encounter for screening for infections with a predominantly sexual mode of transmission: Secondary | ICD-10-CM

## 2018-06-19 DIAGNOSIS — Z09 Encounter for follow-up examination after completed treatment for conditions other than malignant neoplasm: Secondary | ICD-10-CM | POA: Diagnosis not present

## 2018-06-19 DIAGNOSIS — A5901 Trichomonal vulvovaginitis: Secondary | ICD-10-CM | POA: Diagnosis not present

## 2018-06-19 DIAGNOSIS — Z8619 Personal history of other infectious and parasitic diseases: Secondary | ICD-10-CM | POA: Diagnosis not present

## 2018-06-19 NOTE — Progress Notes (Addendum)
Subjective:    Patient ID: Rachel Mckinney, female    DOB: Sep 05, 1976, 42 y.o.   MRN: [id number]   CC: STD screen  HPI:  STI testing: Patient here for STI testing after possible exposure Up to date on her Pap and would like to self-swab.  ROS: Denies any abnormal vaginal bleeding, vaginal discharge, abdominal pain, fevers, chills, vomiting or diarrhea. Reports some odor and wants to be re-tested for trich as she has recently had this but was treated, would like to self swab.  Smoking status reviewed  Review of Systems Per HPI  Patient Active Problem List   Diagnosis Date Noted  . Vaginal discharge 10/26/2016  . Possible exposure to STD 05/24/2016  . Trichomonal vaginitis 05/24/2016  . Abnormal uterine bleeding 03/02/2016  . Intertriginous candidiasis 01/26/2016  . Headache 01/26/2016  . Tobacco abuse 05/12/2015  . HTN (hypertension) 05/12/2015  . Menorrhagia 05/12/2015  . Preventative health care 09/03/2012  . Hidradenitis suppurativa-chronic and involving multiple sites 02/10/2012  . Morbid obesity ([facility]) 11/16/2011     Objective:  BP 130/82 (BP Location: Right Arm, Patient Position: Sitting, Cuff Size: Large)   Pulse 88   Temp 98.1 F (36.7 C) (Oral)   Ht 5\' 5"  (1.651 m)   Wt 252 lb (114.3 kg)   SpO2 99%   BMI 41.93 kg/m  Vitals and nursing note reviewed  General: NAD, pleasant Extremities: no edema or cyanosis. WWP. Skin: warm and dry, no rashes noted Neuro: alert and oriented, no focal deficits Psych: normal affect GU/GYN: deferred due to patient preference  Assessment & Plan:   STI screen: Screening patient for GC/Chlamydia and trichomonas vaginitis. Also will screen for HIV and RPR after discussion. Patient encouraged to schedule f/u with PCP to discuss htn (BP 130/82 today) and have regular physical.   [place] Anand Tejada, DO Family Medicine Resident PGY-2

## 2018-06-19 NOTE — Patient Instructions (Signed)
Thank you for coming to see me today. It was a pleasure! Today we talked about:   Your STI screening. We will call you with your results.   Please schedule a follow up for a physical to get regular blood test with your primary care physician.   If you have any questions or concerns, please do not hesitate to call the office at [phone].  Take Care,   [place] Tangi Shroff, DO

## 2018-06-20 LAB — RPR: RPR Ser Ql: NONREACTIVE

## 2018-06-20 LAB — HIV ANTIBODY (ROUTINE TESTING W REFLEX): HIV SCREEN 4TH GENERATION: NONREACTIVE

## 2018-06-21 LAB — CERVICOVAGINAL ANCILLARY ONLY
Chlamydia: NEGATIVE
NEISSERIA GONORRHEA: NEGATIVE
TRICH (WINDOWPATH): NEGATIVE

## 2018-06-22 ENCOUNTER — Encounter: Payer: Self-pay | Admitting: *Deleted

## 2018-07-03 NOTE — Progress Notes (Deleted)
   Subjective:    Patient ID: Rachel Mckinney, female    DOB: September 26, 1976, 42 y.o.   MRN: [id number]   CC:  HPI:  Transvaginal US in 2016 showing Multiple leiomyoma of the uterus, with otherwise unremarkable study. Pelvic US showing similar readings   Smoking status reviewed  Review of Systems   Objective:  There were no vitals taken for this visit. Vitals and nursing note reviewed  General: well nourished, in no acute distress HEENT: normocephalic, TM's visualized bilaterally, no scleral icterus or conjunctival pallor, no nasal discharge, moist mucous membranes, good dentition without erythema or discharge noted in posterior oropharynx Neck: supple, non-tender, without lymphadenopathy Cardiac: RRR, clear S1 and S2, no murmurs, rubs, or gallops Respiratory: clear to auscultation bilaterally, no increased work of breathing Abdomen: soft, nontender, nondistended, no masses or organomegaly. Bowel sounds present Extremities: no edema or cyanosis. Warm, well perfused. 2+ radial and PT pulses bilaterally Skin: warm and dry, no rashes noted Neuro: alert and oriented, no focal deficits   Assessment & Plan:    No problem-specific Assessment & Plan notes found for this encounter.    No follow-ups on file.   Caroline More, DO, PGY-2

## 2018-07-04 ENCOUNTER — Ambulatory Visit: Payer: BLUE CROSS/BLUE SHIELD | Admitting: Family Medicine

## 2018-07-17 ENCOUNTER — Ambulatory Visit (HOSPITAL_COMMUNITY)
Admission: EM | Admit: 2018-07-17 | Discharge: 2018-07-17 | Disposition: A | Discharge status: Home / Self Care | Payer: BLUE CROSS/BLUE SHIELD | Attending: Internal Medicine | Admitting: Internal Medicine

## 2018-07-17 ENCOUNTER — Encounter (HOSPITAL_COMMUNITY): Payer: Self-pay | Admitting: Emergency Medicine

## 2018-07-17 DIAGNOSIS — L03317 Cellulitis of buttock: Secondary | ICD-10-CM | POA: Diagnosis not present

## 2018-07-17 DIAGNOSIS — L0231 Cutaneous abscess of buttock: Secondary | ICD-10-CM

## 2018-07-17 MED ORDER — CEPHALEXIN 500 MG PO CAPS: 500.0000 mg | ORAL_CAPSULE | Freq: Four times a day (QID) | ORAL | 0 refills | Status: DC

## 2018-07-17 MED ORDER — HYDROCODONE-ACETAMINOPHEN 5-325 MG PO TABS: 1.0000 | ORAL_TABLET | Freq: Four times a day (QID) | ORAL | 0 refills | Status: DC | PRN

## 2018-07-17 MED ORDER — LIDOCAINE-EPINEPHRINE (PF) 2 %-1:200000 IJ SOLN
INTRAMUSCULAR | Status: AC
  Filled 2018-07-17: qty 20

## 2018-07-17 MED ORDER — MELOXICAM 7.5 MG PO TABS: 7.5000 mg | ORAL_TABLET | Freq: Every day | ORAL | 0 refills | Status: DC

## 2018-07-17 MED ORDER — DOXYCYCLINE HYCLATE 100 MG PO CAPS: 100.0000 mg | ORAL_CAPSULE | Freq: Two times a day (BID) | ORAL | 0 refills | Status: DC

## 2018-07-17 NOTE — Discharge Instructions (Signed)
Small abscess drained. However, has surrounding skin infection. Start keflex and doxycycline as directed. Mobic for pain. Norco for breakthrough pain. Warm compress to the area. Follow up with dermatologist for further evaluation needed. If experiencing pain surrounding the rectum, pain with bowel movement, drainage to the rectum, go to the emergency department for further evaluation needed.

## 2018-07-17 NOTE — ED Triage Notes (Signed)
PT reports abscess in gluteal fold that is draining.

## 2018-07-17 NOTE — ED Provider Notes (Signed)
[facility]    CSN: [id number] Arrival date & time: 07/17/18  1012     History   Chief Complaint Chief Complaint  Patient presents with  . Abscess    APPT    HPI Rachel Mckinney is a 42 y.o. female.   42 year old female with history of hidradenitis suppurativa, HTN comes in for abscess to the gluteal cleft.  States symptoms started yesterday, felt drainage today, and came in for evaluation.  Denies fever, chills, night sweats.  Has pain around her gluteal folds, unsure of warmth and erythema due to location.  She denies any rectal pain.  States had been doing well on Humira, but missed doses for a few weeks due to insurance.  Has restarted back on Humira, but thinks that could be the cause of current flareup.     Past Medical History:  Diagnosis Date  . Boils    under breast and inner thigh  . Hidradenitis suppurativa    Chronic problem for patient, multiple admissions for surgical drainage  . Hypertension     Patient Active Problem List   Diagnosis Date Noted  . Vaginal discharge 10/26/2016  . Possible exposure to STD 05/24/2016  . Trichomonal vaginitis 05/24/2016  . Abnormal uterine bleeding 03/02/2016  . Intertriginous candidiasis 01/26/2016  . Headache 01/26/2016  . Tobacco abuse 05/12/2015  . HTN (hypertension) 05/12/2015  . Menorrhagia 05/12/2015  . Preventative health care 09/03/2012  . Hidradenitis suppurativa-chronic and involving multiple sites 02/10/2012  . Morbid obesity ([place]) 11/16/2011    Past Surgical History:  Procedure Laterality Date  . CESAREAN SECTION    . multiple abscess drainages      OB History    Gravida  3   Para      Term      Preterm      AB  2   Living  1     SAB      TAB      Ectopic      Multiple      Live Births  1            Home Medications    Prior to Admission medications   Medication Sig Start Date End Date Taking? Authorizing Provider  Adalimumab (HUMIRA [place]) Inject into the skin.  Dose uncertain.  Being prescribed by derm   Yes [provider]  spironolactone (ALDACTONE) 50 MG tablet Take 50 mg by mouth daily. 12/20/17  Yes [provider]  cephALEXin (KEFLEX) 500 MG capsule Take 1 capsule (500 mg total) by mouth 4 (four) times daily. 07/17/18   Tasia Catchings, Amy V, PA-C  doxycycline (VIBRAMYCIN) 100 MG capsule Take 1 capsule (100 mg total) by mouth 2 (two) times daily. 07/17/18   Tasia Catchings, Amy V, PA-C  EPINEPHrine 0.3 mg/0.3 mL IJ SOAJ injection Inject 0.3 mLs (0.3 mg total) into the muscle once as needed for up to 1 dose (anaphylaxis (difficulty breathing or swallowing)). 01/02/18   Duffy Bruce, MD  HYDROcodone-acetaminophen (NORCO/VICODIN) 5-325 MG tablet Take 1 tablet by mouth every 6 (six) hours as needed. 07/17/18   Tasia Catchings, Amy V, PA-C  meloxicam (MOBIC) 7.5 MG tablet Take 1 tablet (7.5 mg total) by mouth daily. 07/17/18   Tasia Catchings, Amy V, PA-C  OVER THE COUNTER MEDICATION Apply 1 application topically daily. Magnesium sulfate cream    [provider]    Family History Family History  Problem Relation Age of Onset  . Liver disease Father   . Hypertension Father   .  Hypertension Sister     Social History Social History   Tobacco Use  . Smoking status: Current Every Day Smoker    Packs/day: 0.30    Types: Cigarettes  . Smokeless tobacco: Never Used  Substance Use Topics  . Alcohol use: Yes    Comment: on weekends  . Drug use: Yes    Types: Marijuana    Comment: occas use of marijuana     Allergies   Patient has no known allergies.   Review of Systems Review of Systems  Reason unable to perform ROS: See HPI as above.     Physical Exam Triage Vital Signs ED Triage Vitals  Enc Vitals Group     BP 07/17/18 1033 (!) 176/96     Pulse Rate 07/17/18 1033 74     Resp 07/17/18 1033 16     Temp 07/17/18 1033 98.3 F (36.8 C)     Temp Source 07/17/18 1033 Oral     SpO2 07/17/18 1033 100 %     Weight --      Height --      Head Circumference --        Peak Flow --      Pain Score 07/17/18 1032 10     Pain Loc --      Pain Edu? --      Excl. in [place]? --    No data found.  Updated Vital Signs BP (!) 176/96   Pulse 74   Temp 98.3 F (36.8 C) (Oral)   Resp 16   LMP 07/16/2018   SpO2 100%   Physical Exam  Constitutional: She is oriented to person, place, and time. She appears well-developed and well-nourished. No distress.  HENT:  Head: Normocephalic and atraumatic.  Eyes: Pupils are equal, round, and reactive to light. Conjunctivae are normal.  Neurological: She is alert and oriented to person, place, and time.  Skin: She is not diaphoretic.  1cm x 1cm abscess to the right mid gluteal cleft with 10cm surrounding induration, erythema, warmth. No tenderness to palpation perirectally. No warm, erythema, swelling to the perirectal area.      UC Treatments / Results  Labs (all labs ordered are listed, but only abnormal results are displayed) Labs Reviewed - No data to display  EKG None  Radiology No results found.  Procedures Incision and Drainage Date/Time: 07/17/2018 11:29 AM Performed by: Ok Edwards, PA-C Authorized by: Wynona Luna, MD   Consent:    Consent obtained:  Verbal   Consent given by:  Patient   Risks discussed:  Bleeding, incomplete drainage, infection and pain   Alternatives discussed:  Alternative treatment Location:    Type:  Abscess   Size:  1cm x 1cm   Location:  Anogenital   Anogenital location:  Gluteal cleft Pre-procedure details:    Skin preparation:  Betadine Anesthesia (see MAR for exact dosages):    Anesthesia method:  Local infiltration   Local anesthetic:  Lidocaine 2% WITH epi Procedure type:    Complexity:  Simple Procedure details:    Needle aspiration: no     Incision types:  Stab incision   Incision depth:  Subcutaneous   Scalpel blade:  11   Wound management:  Probed and deloculated   Drainage:  Purulent   Drainage amount:  Scant   Wound treatment:  Wound left  open   Packing materials:  None Post-procedure details:    Patient tolerance of procedure:  Tolerated well, no immediate complications   (  including critical care time)  Medications Ordered in UC Medications - No data to display  Initial Impression / Assessment and Plan / UC Course  I have reviewed the triage vital signs and the nursing notes.  Pertinent labs & imaging results that were available during my care of the patient were reviewed by me and considered in my medical decision making (see chart for details).    Patient tolerated procedure well.  Given surrounding cellulitis with history of  hidradenitis, will start patient on Keflex and doxycycline.  Mobic for pain, Norco for breakthrough pain.  Warm compress to the area.  Patient to follow-up with dermatologist for further evaluation and management needed.  Return precautions given.  Patient expresses understanding and agrees to plan.  Final Clinical Impressions(s) / UC Diagnoses   Final diagnoses:  Abscess of buttock, right  Cellulitis of buttock    ED Prescriptions    Medication Sig Dispense Auth. Provider   cephALEXin (KEFLEX) 500 MG capsule Take 1 capsule (500 mg total) by mouth 4 (four) times daily. 28 capsule Yu, Amy V, PA-C   doxycycline (VIBRAMYCIN) 100 MG capsule Take 1 capsule (100 mg total) by mouth 2 (two) times daily. 20 capsule Yu, Amy V, PA-C   HYDROcodone-acetaminophen (NORCO/VICODIN) 5-325 MG tablet Take 1 tablet by mouth every 6 (six) hours as needed. 15 tablet Yu, Amy V, PA-C   meloxicam (MOBIC) 7.5 MG tablet Take 1 tablet (7.5 mg total) by mouth daily. 15 tablet Ok Edwards, PA-C     Controlled Substance Prescriptions [place] Controlled Substance Registry consulted? Yes, I have consulted the [place] Controlled Substances Registry for this patient, and feel the risk/benefit ratio today is favorable for proceeding with this prescription for a controlled substance.   Ok Edwards, PA-C 07/17/18 1131

## 2018-09-05 ENCOUNTER — Encounter (HOSPITAL_COMMUNITY): Payer: Self-pay | Admitting: Emergency Medicine

## 2018-09-05 ENCOUNTER — Ambulatory Visit (HOSPITAL_COMMUNITY)
Admission: EM | Admit: 2018-09-05 | Discharge: 2018-09-05 | Disposition: A | Discharge status: Home / Self Care | Payer: BLUE CROSS/BLUE SHIELD | Attending: Family Medicine | Admitting: Family Medicine

## 2018-09-05 DIAGNOSIS — N764 Abscess of vulva: Secondary | ICD-10-CM

## 2018-09-05 DIAGNOSIS — L0231 Cutaneous abscess of buttock: Secondary | ICD-10-CM

## 2018-09-05 MED ORDER — MELOXICAM 7.5 MG PO TABS: 7.5000 mg | ORAL_TABLET | Freq: Every day | ORAL | 0 refills | Status: DC

## 2018-09-05 MED ORDER — CEPHALEXIN 500 MG PO CAPS: 500.0000 mg | ORAL_CAPSULE | Freq: Four times a day (QID) | ORAL | 0 refills | Status: DC

## 2018-09-05 MED ORDER — DOXYCYCLINE HYCLATE 100 MG PO CAPS: 100.0000 mg | ORAL_CAPSULE | Freq: Two times a day (BID) | ORAL | 0 refills | Status: DC

## 2018-09-05 MED ORDER — TRAMADOL HCL 50 MG PO TABS: 50.0000 mg | ORAL_TABLET | Freq: Two times a day (BID) | ORAL | 0 refills | Status: DC | PRN

## 2018-09-05 NOTE — ED Provider Notes (Signed)
[place]   [id number] 09/05/18 Arrival Time: 1140  CC: SKIN COMPLAINT  SUBJECTIVE:  Rachel Mckinney is a 42 y.o. female hx significant for hidradenitis suppurativa who presents with multiple abscesses.  Was taking humira for HS, but has not been for the past month due to insurance.  Localizes the abscesses to her labia and right buttocks.  Describes it as painful, red and drainage.  Has tried warm comrpesses without relief.  Symptoms are made worse with sitting.  Reports similar symptoms in the past that improved with antibiotics.  Reports feeling fatigued.   Denies fever, chills, nausea, vomiting, SOB, chest pain, abdominal pain, changes in bowel or bladder function.    ROS: As per HPI.  Past Medical History:  Diagnosis Date  . Boils    under breast and inner thigh  . Hidradenitis suppurativa    Chronic problem for patient, multiple admissions for surgical drainage  . Hypertension    Past Surgical History:  Procedure Laterality Date  . CESAREAN SECTION    . multiple abscess drainages     No Known Allergies Current Facility-Administered Medications on File Prior to Encounter  Medication Dose Route Frequency Provider Last Rate Last Dose  . betamethasone acetate-betamethasone sodium phosphate (CELESTONE) injection 3 mg  3 mg Intramuscular Once Edrick Kins, DPM       Current Outpatient Medications on File Prior to Encounter  Medication Sig Dispense Refill  . Adalimumab (HUMIRA [place]) Inject into the skin. Dose uncertain.  Being prescribed by derm    . EPINEPHrine 0.3 mg/0.3 mL IJ SOAJ injection Inject 0.3 mLs (0.3 mg total) into the muscle once as needed for up to 1 dose (anaphylaxis (difficulty breathing or swallowing)). 1 Device 0  . HYDROcodone-acetaminophen (NORCO/VICODIN) 5-325 MG tablet Take 1 tablet by mouth every 6 (six) hours as needed. (Patient not taking: Reported on 09/05/2018) 15 tablet 0  . OVER THE COUNTER MEDICATION Apply 1 application topically daily.  Magnesium sulfate cream    . spironolactone (ALDACTONE) 50 MG tablet Take 50 mg by mouth daily.  1   Social History   Socioeconomic History  . Marital status: Single    Spouse name: Not on file  . Number of children: Not on file  . Years of education: Not on file  . Highest education level: Not on file  Occupational History  . Not on file  Social Needs  . Financial resource strain: Not on file  . Food insecurity:    Worry: Not on file    Inability: Not on file  . Transportation needs:    Medical: Not on file    Non-medical: Not on file  Tobacco Use  . Smoking status: Current Every Day Smoker    Packs/day: 0.30    Types: Cigarettes  . Smokeless tobacco: Never Used  Substance and Sexual Activity  . Alcohol use: Yes    Comment: on weekends  . Drug use: Yes    Types: Marijuana    Comment: occas use of marijuana  . Sexual activity: Not on file  Lifestyle  . Physical activity:    Days per week: Not on file    Minutes per session: Not on file  . Stress: Not on file  Relationships  . Social connections:    Talks on phone: Not on file    Gets together: Not on file    Attends religious service: Not on file    Active member of club or organization: Not on file  Attends meetings of clubs or organizations: Not on file    Relationship status: Not on file  . Intimate partner violence:    Fear of current or ex partner: Not on file    Emotionally abused: Not on file    Physically abused: Not on file    Forced sexual activity: Not on file  Other Topics Concern  . Not on file  Social History Narrative  . Not on file   Family History  Problem Relation Age of Onset  . Liver disease Father   . Hypertension Father   . Hypertension Sister     OBJECTIVE: Vitals:   09/05/18 1149  BP: (!) 151/78  Pulse: 80  Resp: 16  Temp: 98.7 F (37.1 C)  SpO2: 100%    General appearance: alert; appears uncomfortable  Lungs: clear to auscultation bilaterally Heart: regular rate and  rhythm.  Radial pulse 2+ bilaterally Extremities: no edema Skin: warm and dry; significant left labial swelling with diffuse tenderness, no obvious abscess or drainage, not erythematous; right labia with minimal swelling and diffuse tenderness.  Abscess to right mid gluteal cleft with surrounding induration, tender to palpation with obvious purulent active drainage  Psychological: alert and cooperative; tearful during examination  ASSESSMENT & PLAN:  1. Abscess of genital labia     Meds ordered this encounter  Medications  . cephALEXin (KEFLEX) 500 MG capsule    Sig: Take 1 capsule (500 mg total) by mouth 4 (four) times daily.    Dispense:  28 capsule    Refill:  0    Order Specific Question:   Supervising Provider    Answer:   Wynona Luna [[id number]]  . doxycycline (VIBRAMYCIN) 100 MG capsule    Sig: Take 1 capsule (100 mg total) by mouth 2 (two) times daily.    Dispense:  20 capsule    Refill:  0    Order Specific Question:   Supervising Provider    Answer:   Wynona Luna [phone]  . meloxicam (MOBIC) 7.5 MG tablet    Sig: Take 1 tablet (7.5 mg total) by mouth daily.    Dispense:  15 tablet    Refill:  0    Order Specific Question:   Supervising Provider    Answer:   Wynona Luna [phone]  . traMADol (ULTRAM) 50 MG tablet    Sig: Take 1 tablet (50 mg total) by mouth every 12 (twelve) hours as needed for severe pain.    Dispense:  10 tablet    Refill:  0    Order Specific Question:   Supervising Provider    Answer:   Wynona Luna [[id number]]    Apply warm compresses 3-4x daily for 10-15 minutes Wash site daily with warm water and mild soap Keep covered to avoid friction Take antibiotics as prescribed and to completion Mobic for pain Tramadol for break-through pain.  Do not take while driving or operating heavy machinery Follow up here or with PCP if symptoms persists Return or go to the ED if you have any new or worsening symptoms  Reviewed  expectations re: course of current medical issues. Questions answered. Outlined signs and symptoms indicating need for more acute intervention. Patient verbalized understanding. After Visit Summary given.   Lestine Box, PA-C 09/05/18 1301

## 2018-09-05 NOTE — Discharge Instructions (Signed)
Apply warm compresses 3-4x daily for 10-15 minutes Wash site daily with warm water and mild soap Keep covered to avoid friction Take antibiotics as prescribed and to completion Mobic for pain Tramadol for break-through pain.  Do not take while driving or operating heavy machinery Follow up here or with PCP if symptoms persists Return or go to the ED if you have any new or worsening symptoms such as increased pain, redness, swelling, fever, chills, nausea, vomiting, abdominal pain, changes in bowel or bladder habits, etc..Marland KitchenMarland Kitchen

## 2018-09-05 NOTE — ED Triage Notes (Signed)
Pt c/o abscess in her labia, outside her labia, and one on her bottom. Pt hx of abscess.

## 2018-10-15 ENCOUNTER — Telehealth: Payer: Self-pay

## 2018-10-15 ENCOUNTER — Ambulatory Visit (INDEPENDENT_AMBULATORY_CARE_PROVIDER_SITE_OTHER): Payer: BLUE CROSS/BLUE SHIELD | Admitting: Family Medicine

## 2018-10-15 VITALS — BP 137/80 | HR 93 | Temp 98.3°F | Wt 238.2 lb

## 2018-10-15 DIAGNOSIS — N939 Abnormal uterine and vaginal bleeding, unspecified: Secondary | ICD-10-CM | POA: Diagnosis not present

## 2018-10-15 LAB — POCT URINE PREGNANCY: Preg Test, Ur: NEGATIVE

## 2018-10-15 LAB — POCT HEMOGLOBIN: Hemoglobin: 11.3 g/dL (ref 9.5–13.5)

## 2018-10-15 MED ORDER — IBUPROFEN 600 MG PO TABS: 600.0000 mg | ORAL_TABLET | Freq: Three times a day (TID) | ORAL | 1 refills | Status: DC | PRN

## 2018-10-15 MED ORDER — MEGESTROL ACETATE 40 MG PO TABS: 40.0000 mg | ORAL_TABLET | Freq: Two times a day (BID) | ORAL | 2 refills | Status: DC

## 2018-10-15 NOTE — Progress Notes (Signed)
   Subjective:    Patient ID: Rachel Mckinney, female    DOB: 02-22-1976, 42 y.o.   MRN: [id number]   CC: vaginal bleeding  HPI: Vaginal bleeding Patient presenting today with 1 and half months of vaginal bleeding.  States she has very large clots.  Describes them about 4 to 5 cm in size.  States she also has increased cramping as well.  These clots and cramping occur daily.  States that this also occurred 5 years ago at which time she was told she has uterine fibroids which were small.  Ultrasound from 05/2015 showing multiple leiomyomas.  Patient ports that 1/2 months ago her period started and she had 4 to 5 days of very heavy bleeding, use 4-5 heavy size pads a day..  Then lightened to only using 1-2 light pads a day but this bleeding has not stopped.  States that she has been using ibuprofen 800 mg every other day for pain.  Patient reports no shortness of breath, dizziness, chest pain, palpitations.  Objective:  BP 137/80   Pulse 93   Temp 98.3 F (36.8 C) (Oral)   Wt 238 lb 3.2 oz (108 kg)   SpO2 97%   BMI 39.64 kg/m  Vitals and nursing note reviewed  General: well nourished, in no acute distress HEENT: normocephalic, TM's visualized bilaterally, no scleral icterus or conjunctival pallor, no nasal discharge, moist mucous membranes, good dentition without erythema or discharge noted in posterior oropharynx Neck: supple, non-tender, without lymphadenopathy Cardiac: RRR, clear S1 and S2, no murmurs, rubs, or gallops Respiratory: clear to auscultation bilaterally, no increased work of breathing Abdomen: soft, nontender, nondistended, no masses or organomegaly. Bowel sounds present Extremities: no edema or cyanosis. Warm, well perfused. 2+ radial and PT pulses bilaterally Skin: warm and dry, no rashes noted Neuro: alert and oriented, no focal deficits Female genitalia: normal external genitalia, vulva, vagina, cervix, uterus and adnexa. Difficult exam 2/2 body habitus. Tenderness to  palpation of uterus  Assessment & Plan:    Abnormal uterine bleeding Likely secondary to fibroids.  Ultrasound from 05/2015 showing Uterus measurement: 80.4 cm x 4.3 cm x 5.4 cm. Uterus is anteverted. Leiomyoma in the posterior wall of the uterus, with 2 small leiomyoma in the fundus and 1 within the left body.  Can consider enlargement of fibroids as possible explanation.  Bimanual exam difficult due to body habitus.  No significant polyps noted on bimanual exam.  UPreg Negative. Will give Megace 40 mg twice daily to be used until results of ultrasound.  At that time can consider continuing dose or decreasing or discontinuing completely.  Will obtain complete pelvic ultrasound with transvaginal ultrasound.  We will also check hemoglobin today to ensure no worsening anemia.  Will send amatory referral to OB/GYN for future management as patient will likely need surgical management in the future.  Advised patient to use ibuprofen 600 mg until symptoms improve at which time she can decrease to 400 mg and then as needed.  Strict return precautions given.  Follow-up in 1 month.  Discussed patient with Dr. Mingo Amber    Return in about 4 weeks (around 11/12/2018).   Caroline More, DO, PGY-2

## 2018-10-15 NOTE — Patient Instructions (Signed)
It was a pleasure seeing you today.   Today we discussed your abnormal uterine bleeding  For your abnormal uterine bleeding: please use ibuprofen 600mg  till you start feeling better, then taper to 400mg , and then just as needed. Use megace 40mg  twice a day until I call with the results of your ultrasound. I have also referred you to OB/GYN.   Please follow up in 1 month or sooner if symptoms persist or worsen. Please call the clinic immediately if you have any concerns.   Our clinic's number is [phone]. Please call with questions or concerns.   Please go to the emergency room if you have shortness of breath or chest pain.   Thank you,  Caroline More, DO

## 2018-10-15 NOTE — Telephone Encounter (Signed)
Called and informed patient of negative pregnancy test per Dr. Gavin Pound, Gaye Alken, CMA

## 2018-10-15 NOTE — Assessment & Plan Note (Addendum)
Likely secondary to fibroids.  Ultrasound from 05/2015 showing Uterus measurement: 80.4 cm x 4.3 cm x 5.4 cm. Uterus is anteverted. Leiomyoma in the posterior wall of the uterus, with 2 small leiomyoma in the fundus and 1 within the left body.  Can consider enlargement of fibroids as possible explanation.  Bimanual exam difficult due to body habitus.  No significant polyps noted on bimanual exam.  UPreg Negative. Will give Megace 40 mg twice daily to be used until results of ultrasound.  At that time can consider continuing dose or decreasing or discontinuing completely.  Will obtain complete pelvic ultrasound with transvaginal ultrasound.  We will also check hemoglobin today to ensure no worsening anemia.  Will send amatory referral to OB/GYN for future management as patient will likely need surgical management in the future.  Advised patient to use ibuprofen 600 mg until symptoms improve at which time she can decrease to 400 mg and then as needed.  Strict return precautions given.  Follow-up in 1 month.  Discussed patient with Dr. Mingo Amber

## 2018-10-16 ENCOUNTER — Telehealth: Payer: Self-pay | Admitting: *Deleted

## 2018-10-16 NOTE — Telephone Encounter (Signed)
-----   Message from Caroline More, DO sent at 10/16/2018 11:44 AM EDT ----- Please inform patient that hemoglobin is normal and actually improved from 1 year ago

## 2018-10-16 NOTE — Telephone Encounter (Signed)
LMOVM informing pt of normal labs. Kyree Adriano Kennon Holter, CMA

## 2018-10-19 ENCOUNTER — Ambulatory Visit
Admission: RE | Admit: 2018-10-19 | Discharge: 2018-10-19 | Disposition: A | Discharge status: Home / Self Care | Payer: BLUE CROSS/BLUE SHIELD | Source: Ambulatory Visit | Attending: Family Medicine | Admitting: Family Medicine

## 2018-10-19 DIAGNOSIS — N939 Abnormal uterine and vaginal bleeding, unspecified: Secondary | ICD-10-CM

## 2018-10-24 ENCOUNTER — Encounter (HOSPITAL_COMMUNITY): Payer: Self-pay | Admitting: Emergency Medicine

## 2018-10-24 ENCOUNTER — Ambulatory Visit (HOSPITAL_COMMUNITY)
Admission: EM | Admit: 2018-10-24 | Discharge: 2018-10-24 | Disposition: A | Discharge status: Home / Self Care | Payer: BLUE CROSS/BLUE SHIELD | Attending: Family Medicine | Admitting: Family Medicine

## 2018-10-24 DIAGNOSIS — N764 Abscess of vulva: Secondary | ICD-10-CM

## 2018-10-24 DIAGNOSIS — L0291 Cutaneous abscess, unspecified: Secondary | ICD-10-CM

## 2018-10-24 MED ORDER — DOXYCYCLINE HYCLATE 100 MG PO CAPS: 100.0000 mg | ORAL_CAPSULE | Freq: Two times a day (BID) | ORAL | 0 refills | Status: DC

## 2018-10-24 NOTE — ED Triage Notes (Signed)
Pt sts abscess to groin area with hx of same

## 2018-10-24 NOTE — Discharge Instructions (Signed)
Please begin doxycycline for 10 days ° °Apply warm compresses/hot rags to area with massage to express further drainage especially the first 24-48 hours ° °Return if symptoms returning or not improving  °

## 2018-10-25 DIAGNOSIS — N764 Abscess of vulva: Secondary | ICD-10-CM | POA: Diagnosis not present

## 2018-10-25 NOTE — ED Provider Notes (Signed)
[facility]    CSN: [id number] Arrival date & time: 10/24/18  1624     History   Chief Complaint Chief Complaint  Patient presents with  . Abscess    HPI Rachel Mckinney is a 42 y.o. female history of hidradenitis suppurativa, tobacco use, hypertension presenting today for evaluation of an abscess.  Patient states that she has had an abscess to her upper pubic area/lower abdomen for the past 2 to 3 days.  She has been applying warm compresses and other home remedies without relief.  This wound has progressed quickly and has become very painful.  She has had multiple surgeries in the past for removal of sweat glands.  Most recently she has been seeing dermatology for her at bedtime.  She has been on Humira injections which have decreased frequency of symptoms.  Of recently she has had a change in insurance and has not been unable to have these injections as they cost $7000 per injection.  HPI  Past Medical History:  Diagnosis Date  . Boils    under breast and inner thigh  . Hidradenitis suppurativa    Chronic problem for patient, multiple admissions for surgical drainage  . Hypertension     Patient Active Problem List   Diagnosis Date Noted  . Vaginal discharge 10/26/2016  . Possible exposure to STD 05/24/2016  . Trichomonal vaginitis 05/24/2016  . Abnormal uterine bleeding 03/02/2016  . Intertriginous candidiasis 01/26/2016  . Headache 01/26/2016  . Tobacco abuse 05/12/2015  . HTN (hypertension) 05/12/2015  . Menorrhagia 05/12/2015  . Preventative health care 09/03/2012  . Hidradenitis suppurativa-chronic and involving multiple sites 02/10/2012  . Morbid obesity ([place]) 11/16/2011    Past Surgical History:  Procedure Laterality Date  . CESAREAN SECTION    . multiple abscess drainages      OB History    Gravida  3   Para      Term      Preterm      AB  2   Living  1     SAB      TAB      Ectopic      Multiple      Live Births  1              Home Medications    Prior to Admission medications   Medication Sig Start Date End Date Taking? Authorizing Provider  Adalimumab (HUMIRA [place]) Inject into the skin. Dose uncertain.  Being prescribed by derm    [provider]  doxycycline (VIBRAMYCIN) 100 MG capsule Take 1 capsule (100 mg total) by mouth 2 (two) times daily. 10/24/18   Cornie Mccomber C, PA-C  EPINEPHrine 0.3 mg/0.3 mL IJ SOAJ injection Inject 0.3 mLs (0.3 mg total) into the muscle once as needed for up to 1 dose (anaphylaxis (difficulty breathing or swallowing)). 01/02/18   Duffy Bruce, MD  ibuprofen (ADVIL,MOTRIN) 600 MG tablet Take 1 tablet (600 mg total) by mouth every 8 (eight) hours as needed. 10/15/18   Caroline More, DO  megestrol (MEGACE) 40 MG tablet Take 1 tablet (40 mg total) by mouth 2 (two) times daily. 10/15/18   Caroline More, DO  meloxicam (MOBIC) 7.5 MG tablet Take 1 tablet (7.5 mg total) by mouth daily. 09/05/18   [place], [place], PA-C  OVER THE COUNTER MEDICATION Apply 1 application topically daily. Magnesium sulfate cream    [provider]  spironolactone (ALDACTONE) 50 MG tablet Take 50 mg by mouth daily. 12/20/17  [provider]    Family History Family History  Problem Relation Age of Onset  . Liver disease Father   . Hypertension Father   . Hypertension Sister     Social History Social History   Tobacco Use  . Smoking status: Current Every Day Smoker    Packs/day: 0.30    Types: Cigarettes  . Smokeless tobacco: Never Used  Substance Use Topics  . Alcohol use: Yes    Comment: on weekends  . Drug use: Yes    Types: Marijuana    Comment: occas use of marijuana     Allergies   Patient has no known allergies.   Review of Systems Review of Systems  Constitutional: Negative for fatigue and fever.  HENT: Negative for mouth sores.   Eyes: Negative for visual disturbance.  Respiratory: Negative for shortness of breath.   Cardiovascular:  Negative for chest pain.  Gastrointestinal: Negative for abdominal pain, nausea and vomiting.  Genitourinary: Negative for genital sores.  Musculoskeletal: Negative for arthralgias and joint swelling.  Skin: Positive for color change. Negative for rash and wound.  Neurological: Negative for dizziness, weakness, light-headedness and headaches.     Physical Exam Triage Vital Signs ED Triage Vitals  Enc Vitals Group     BP 10/24/18 1652 (!) 169/99     Pulse Rate 10/24/18 1652 85     Resp 10/24/18 1652 18     Temp 10/24/18 1652 98.6 F (37 C)     Temp Source 10/24/18 1652 Oral     SpO2 10/24/18 1652 98 %     Weight --      Height --      Head Circumference --      Peak Flow --      Pain Score 10/24/18 1653 8     Pain Loc --      Pain Edu? --      Excl. in [place]? --    No data found.  Updated Vital Signs BP (!) 169/99 (BP Location: Right Arm)   Pulse 85   Temp 98.6 F (37 C) (Oral)   Resp 18   SpO2 98%   Visual Acuity Right Eye Distance:   Left Eye Distance:   Bilateral Distance:    Right Eye Near:   Left Eye Near:    Bilateral Near:     Physical Exam  Constitutional: She is oriented to person, place, and time. She appears well-developed and well-nourished.  No acute distress  HENT:  Head: Normocephalic and atraumatic.  Nose: Nose normal.  Eyes: Conjunctivae are normal.  Neck: Neck supple.  Cardiovascular: Normal rate.  Pulmonary/Chest: Effort normal. No respiratory distress.  Abdominal: She exhibits no distension.  Genitourinary:  Genitourinary Comments: Upper mons with areas of induration, erythema and tenderness, fluctuant area overlying majority of erythema, multiple scars from previous I&D's.  Musculoskeletal: Normal range of motion.  Neurological: She is alert and oriented to person, place, and time.  Skin: Skin is warm and dry.  Psychiatric: She has a normal mood and affect.  Nursing note and vitals reviewed.    UC Treatments / Results  Labs (all  labs ordered are listed, but only abnormal results are displayed) Labs Reviewed - No data to display  EKG None  Radiology No results found.  Procedures Incision and Drainage Date/Time: 10/25/2018 10:46 AM Performed by: Lennix Rotundo, Elesa Hacker, PA-C Authorized by: Vanessa Kick, MD   Consent:    Consent obtained:  Verbal   Consent given by:  Patient   Risks discussed:  Bleeding, incomplete drainage, pain and damage to other organs   Alternatives discussed:  No treatment and alternative treatment Location:    Type:  Abscess   Size:  5 cm   Location:  Anogenital   Anogenital location: mons pubis. Pre-procedure details:    Skin preparation:  Betadine Anesthesia (see MAR for exact dosages):    Anesthesia method:  Local infiltration   Local anesthetic:  Lidocaine 2% w/o epi Procedure type:    Complexity:  Simple Procedure details:    Needle aspiration: no     Incision types:  Single straight   Incision depth:  Subcutaneous   Scalpel blade:  11   Wound management:  Probed and deloculated   Drainage:  Purulent and bloody   Drainage amount:  Copious   Packing materials:  None Post-procedure details:    Patient tolerance of procedure:  Tolerated well, no immediate complications   (including critical care time)  Medications Ordered in UC Medications - No data to display  Initial Impression / Assessment and Plan / UC Course  I have reviewed the triage vital signs and the nursing notes.  Pertinent labs & imaging results that were available during my care of the patient were reviewed by me and considered in my medical decision making (see chart for details).     I&D performed, begin doxycycline, continue warm compresses with massage to express further drainage.  Continue to monitor symptoms,Discussed strict return precautions. Patient verbalized understanding and is agreeable with plan.  Final Clinical Impressions(s) / UC Diagnoses   Final diagnoses:  Abscess     Discharge  Instructions     Please begin doxycycline for 10 days  Apply warm compresses/hot rags to area with massage to express further drainage especially the first 24-48 hours  Return if symptoms returning or not improving    ED Prescriptions    Medication Sig Dispense Auth. Provider   doxycycline (VIBRAMYCIN) 100 MG capsule Take 1 capsule (100 mg total) by mouth 2 (two) times daily. 20 capsule Felix Meras C, PA-C     Controlled Substance Prescriptions [place] Controlled Substance Registry consulted? Not Applicable   Janith Lima, [place] 10/25/18 1047

## 2018-11-18 DIAGNOSIS — R768 Other specified abnormal immunological findings in serum: Secondary | ICD-10-CM

## 2018-11-18 HISTORY — DX: Other specified abnormal immunological findings in serum: R76.8

## 2018-12-03 ENCOUNTER — Other Ambulatory Visit (HOSPITAL_COMMUNITY)
Admission: RE | Admit: 2018-12-03 | Discharge: 2018-12-03 | Disposition: A | Discharge status: Home / Self Care | Payer: BLUE CROSS/BLUE SHIELD | Source: Ambulatory Visit | Attending: Obstetrics and Gynecology | Admitting: Obstetrics and Gynecology

## 2018-12-03 ENCOUNTER — Encounter: Payer: Self-pay | Admitting: Obstetrics and Gynecology

## 2018-12-03 ENCOUNTER — Ambulatory Visit (INDEPENDENT_AMBULATORY_CARE_PROVIDER_SITE_OTHER): Payer: BLUE CROSS/BLUE SHIELD | Admitting: Obstetrics and Gynecology

## 2018-12-03 VITALS — BP 137/88 | HR 82 | Wt 231.4 lb

## 2018-12-03 DIAGNOSIS — Z124 Encounter for screening for malignant neoplasm of cervix: Secondary | ICD-10-CM

## 2018-12-03 DIAGNOSIS — N939 Abnormal uterine and vaginal bleeding, unspecified: Secondary | ICD-10-CM | POA: Diagnosis not present

## 2018-12-03 DIAGNOSIS — L732 Hidradenitis suppurativa: Secondary | ICD-10-CM

## 2018-12-03 LAB — POCT PREGNANCY, URINE: PREG TEST UR: NEGATIVE

## 2018-12-03 MED ORDER — IBUPROFEN 200 MG PO TABS
800.0000 mg | ORAL_TABLET | Freq: Once | ORAL | Status: AC
  Administered 2018-12-03: 800 mg via ORAL

## 2018-12-03 MED ORDER — IBUPROFEN 200 MG PO TABS: 800.0000 mg | ORAL_TABLET | Freq: Once | ORAL | Status: DC

## 2018-12-03 NOTE — Progress Notes (Signed)
ENDOMETRIAL BIOPSY      Rachel Mckinney is a 42 y.o. [id number] here for endometrial biopsy.  The indications for endometrial biopsy were reviewed.  Risks of the biopsy including cramping, bleeding, infection, uterine perforation, inadequate specimen and need for additional procedures were discussed. The patient states she understands and agrees to undergo procedure today. Consent was signed. Time out was performed.   Indications: abnormal uterine bleeding Urine HCG: negative  A bivalve speculum was placed into the vagina and the cervix was easily visualized and was prepped with Betadine x2. A single-toothed tenaculum was placed on the anterior lip of the cervix to stabilize it. The 3 mm pipelle was introduced into the endometrial cavity without difficulty to a depth of 9 cm, and a moderate amount of tissue was obtained and sent to pathology. This was repeated for a total of 3 passes. The instruments were removed from the patient's vagina. Minimal bleeding from the cervix at the tenaculum was noted.   The patient tolerated the procedure well. Routine post-procedure instructions were given to the patient.     Feliz Beam, M.D. Attending Center for [employer] farm [employer])

## 2018-12-03 NOTE — Progress Notes (Signed)
GYNECOLOGY OFFICE FOLLOW UP NOTE  History:  42 y.o. [id number] here today for follow up for heavy bleeding. Has periods now where she will bleed for a week, it will stop for a week, then start again for a full week. Has been irregular like this for 3-4 months. Reports periods have been heavier as well, now she is having blood clots whereas she did not have periods this heavy in the past. Denies light-headedness, dizziness. Saw PCP who gave her Megace to slow bleeding. This has helped but has not stopped bleeding.  Past Medical History:  Diagnosis Date  . Boils    under breast and inner thigh  . Hidradenitis suppurativa    Chronic problem for patient, multiple admissions for surgical drainage  . Hypertension    Past Surgical History:  Procedure Laterality Date  . CESAREAN SECTION    . multiple abscess drainages      Current Outpatient Medications:  .  Adalimumab (HUMIRA [place]), Inject into the skin. Dose uncertain.  Being prescribed by derm, Disp: , Rfl:  .  ibuprofen (ADVIL,MOTRIN) 600 MG tablet, Take 1 tablet (600 mg total) by mouth every 8 (eight) hours as needed., Disp: 30 tablet, Rfl: 1 .  megestrol (MEGACE) 40 MG tablet, Take 1 tablet (40 mg total) by mouth 2 (two) times daily., Disp: 60 tablet, Rfl: 2 .  OVER THE COUNTER MEDICATION, Apply 1 application topically daily. Magnesium sulfate cream, Disp: , Rfl:  .  spironolactone (ALDACTONE) 100 MG tablet, Take 50 mg by mouth daily. , Disp: , Rfl:   Current Facility-Administered Medications:  .  betamethasone acetate-betamethasone sodium phosphate (CELESTONE) injection 3 mg, 3 mg, Intramuscular, Once, Edrick Kins, DPM  The following portions of the patient's history were reviewed and updated as appropriate: allergies, current medications, past family history, past medical history, past social history, past surgical history and problem list.   Review of Systems:  Pertinent items noted in HPI and remainder of comprehensive ROS  otherwise negative.   Objective:  Physical Exam BP 137/88   Pulse 82   Wt 231 lb 6.4 oz (105 kg)   BMI 38.51 kg/m  CONSTITUTIONAL: Well-developed, well-nourished female in no acute distress.  HENT:  Normocephalic, atraumatic. External right and left ear normal. Oropharynx is clear and moist EYES: Conjunctivae and EOM are normal. Pupils are equal, round, and reactive to light. No scleral icterus.  NECK: Normal range of motion, supple, no masses SKIN: Skin is warm and dry. No rash noted. Not diaphoretic. No erythema. No pallor. NEUROLOGIC: Alert and oriented to person, place, and time. Normal reflexes, muscle tone coordination. No cranial nerve deficit noted. PSYCHIATRIC: Normal mood and affect. Normal behavior. Normal judgment and thought content. CARDIOVASCULAR: Normal heart rate noted RESPIRATORY: Effort normal, no problems with respiration noted ABDOMEN: Soft, no distention noted.   PELVIC: Abnormal appearing external genitalia, enlarged left labia 3x the size of the right, firm throughout labia majora, small ulceration near left perineum on thigh posterior to enlarged labia, groin folds grossly obliterated, multiple areas of hidradenitis noted, normal appearing vaginal mucosa and cervix.  No abnormal discharge noted.  Pap smear obtained.  EMB performed, please see separate note. MUSCULOSKELETAL: Normal range of motion. No edema noted.  Labs and Imaging No results found.  Assessment & Plan:   1. Abnormal uterine bleeding (AUB) - Recommended EMB for abnormal uterine bleeding recently, she is agreeable, please see separate note for procedure - return for further management and results - Surgical pathology( [place]/  [facility])  2. Hidradenitis Is following with [facility]   Routine preventative health maintenance measures emphasized. Please refer to After Visit Summary for other counseling recommendations.   Return in about 2 weeks (around 12/17/2018) for Followup.   Feliz Beam, M.D. Attending Center for [employer] Fish farm manager)

## 2018-12-03 NOTE — Addendum Note (Signed)
Addended by: Fidela Juneau A on: 12/03/2018 04:45 PM   Modules accepted: Orders

## 2018-12-05 LAB — CYTOLOGY - PAP
Adequacy: ABSENT
DIAGNOSIS: NEGATIVE
HPV (WINDOPATH): NOT DETECTED

## 2018-12-13 ENCOUNTER — Telehealth: Payer: Self-pay

## 2018-12-13 NOTE — Telephone Encounter (Signed)
-----   Message from Sloan Leiter, MD sent at 12/13/2018  3:27 PM EST ----- Negative cytology, endocervical absent, but sufficient, considered normal pap, repeat 3 years

## 2018-12-14 NOTE — Telephone Encounter (Signed)
LM on pt Rachel Mckinney stating that her results from 12/03/18 are normal and that we will see her in 3 years.  If she has any questions she can give the office a call.

## 2018-12-26 ENCOUNTER — Ambulatory Visit: Payer: BLUE CROSS/BLUE SHIELD | Admitting: Obstetrics and Gynecology

## 2018-12-26 ENCOUNTER — Encounter: Payer: Self-pay | Admitting: Obstetrics and Gynecology

## 2019-04-03 ENCOUNTER — Telehealth: Payer: Self-pay | Admitting: Obstetrics and Gynecology

## 2019-04-04 ENCOUNTER — Encounter: Payer: Self-pay | Admitting: *Deleted

## 2019-04-08 ENCOUNTER — Other Ambulatory Visit: Payer: Self-pay | Admitting: *Deleted

## 2019-04-08 ENCOUNTER — Telehealth: Payer: Self-pay | Admitting: Family Medicine

## 2019-04-08 ENCOUNTER — Telehealth: Payer: Self-pay | Admitting: Obstetrics and Gynecology

## 2019-04-08 DIAGNOSIS — N939 Abnormal uterine and vaginal bleeding, unspecified: Secondary | ICD-10-CM

## 2019-04-08 NOTE — Telephone Encounter (Signed)
Pt reports she is taking Megace and it will stop her bleeding for a week but as soon as she stops taking them she will bleeding heavily again and pass large clots.  Pt wants to know why she is continuing to have such heavy vaginal bleeding and what can be done to stop it so she doesn't have to continue taking the megace.  Advised pt, for now, to continue the megace while bleeding heavily and that I would contact front office staff to schedule an appointment to have pt speak with provider about options to control bleeding. Pt agreeable and verbalized understanding and thanks.

## 2019-04-08 NOTE — Telephone Encounter (Signed)
Called patient to to get her rescheduled for her AUB appointment. Per guidelines patient needs a virtual visit. Apologized to patient about inconvenience. Patient verbalized understanding and wanted a prescription for her megace until her appointment. Caryl Pina notified about request.

## 2019-04-08 NOTE — Telephone Encounter (Signed)
Called patient to get her scheduled with provider to address her bleeding concerns. Patient was scheduled for 4/6 @ 0935. Patient verbalized understanding.

## 2019-04-12 ENCOUNTER — Encounter (HOSPITAL_COMMUNITY): Payer: Self-pay | Admitting: Emergency Medicine

## 2019-04-12 ENCOUNTER — Other Ambulatory Visit: Payer: Self-pay

## 2019-04-12 ENCOUNTER — Ambulatory Visit (HOSPITAL_COMMUNITY)
Admission: EM | Admit: 2019-04-12 | Discharge: 2019-04-12 | Disposition: A | Discharge status: Home / Self Care | Payer: BLUE CROSS/BLUE SHIELD | Attending: Family Medicine | Admitting: Family Medicine

## 2019-04-12 DIAGNOSIS — L03818 Cellulitis of other sites: Secondary | ICD-10-CM | POA: Diagnosis not present

## 2019-04-12 MED ORDER — DOXYCYCLINE HYCLATE 100 MG PO CAPS: 100.0000 mg | ORAL_CAPSULE | Freq: Two times a day (BID) | ORAL | 0 refills | Status: DC

## 2019-04-12 MED ORDER — NAPROXEN 500 MG PO TABS: 500.0000 mg | ORAL_TABLET | Freq: Two times a day (BID) | ORAL | 0 refills | Status: DC

## 2019-04-12 NOTE — Discharge Instructions (Signed)
We are treating for the skin infection with doxycycline You can take naproxen twice a day as needed for pain and inflammation Follow up as needed for continued or worsening symptoms

## 2019-04-12 NOTE — ED Notes (Signed)
Patient able to ambulate independently  

## 2019-04-12 NOTE — ED Triage Notes (Signed)
Pt presents to [facility] for assessment of 2 days of swelling to her labia (believes it is an abscess).

## 2019-04-15 NOTE — ED Provider Notes (Signed)
[facility]    CSN: [id number] Arrival date & time: 04/12/19  1333     History   Chief Complaint Chief Complaint  Patient presents with  . Abscess    HPI Rachel Mckinney is a 43 y.o. female.   Is a 43 year old female with past medical history of hidradenitis, hypertension.  She presents today with severe pain and swelling to the left labia majora.  Reports the swelling is a chronic issue for her but this is worse than normal. Symptoms have been constant and worsening.  There is redness and mild drainage from the area.  She has not done anything to treat her symptoms.  Reports that she takes Humira daily for hidradenitis. No associated fever, chills.   ROS per HPI      Past Medical History:  Diagnosis Date  . Boils    under breast and inner thigh  . Hidradenitis suppurativa    Chronic problem for patient, multiple admissions for surgical drainage  . Hypertension     Patient Active Problem List   Diagnosis Date Noted  . Vaginal discharge 10/26/2016  . Possible exposure to STD 05/24/2016  . Trichomonal vaginitis 05/24/2016  . Abnormal uterine bleeding 03/02/2016  . Intertriginous candidiasis 01/26/2016  . Headache 01/26/2016  . Tobacco abuse 05/12/2015  . HTN (hypertension) 05/12/2015  . Menorrhagia 05/12/2015  . Preventative health care 09/03/2012  . Hidradenitis suppurativa-chronic and involving multiple sites 02/10/2012  . Morbid obesity ([place]) 11/16/2011    Past Surgical History:  Procedure Laterality Date  . CESAREAN SECTION    . multiple abscess drainages      OB History    Gravida  3   Para      Term      Preterm      AB  2   Living  1     SAB      TAB      Ectopic      Multiple      Live Births  1            Home Medications    Prior to Admission medications   Medication Sig Start Date End Date Taking? Authorizing Provider  Adalimumab (HUMIRA [place]) Inject into the skin. Dose uncertain.  Being prescribed by derm     [provider]  doxycycline (VIBRAMYCIN) 100 MG capsule Take 1 capsule (100 mg total) by mouth 2 (two) times daily. 04/12/19   Loura Halt A, NP  ibuprofen (ADVIL,MOTRIN) 600 MG tablet Take 1 tablet (600 mg total) by mouth every 8 (eight) hours as needed. 10/15/18   Caroline More, DO  megestrol (MEGACE) 40 MG tablet Take 1 tablet (40 mg total) by mouth 2 (two) times daily. 10/15/18   Caroline More, DO  naproxen (NAPROSYN) 500 MG tablet Take 1 tablet (500 mg total) by mouth 2 (two) times daily. 04/12/19   Orvan July, NP  OVER THE COUNTER MEDICATION Apply 1 application topically daily. Magnesium sulfate cream    [provider]  spironolactone (ALDACTONE) 100 MG tablet Take 50 mg by mouth daily.     [provider]    Family History Family History  Problem Relation Age of Onset  . Liver disease Father   . Hypertension Father   . Hypertension Sister     Social History Social History   Tobacco Use  . Smoking status: Current Every Day Smoker    Packs/day: 0.10    Types: Cigarettes  . Smokeless tobacco:  Never Used  Substance Use Topics  . Alcohol use: Yes    Comment: on weekends  . Drug use: Yes    Types: Marijuana    Comment: occas use of marijuana     Allergies   Patient has no known allergies.   Review of Systems Review of Systems   Physical Exam Triage Vital Signs ED Triage Vitals  Enc Vitals Group     BP 04/12/19 1348 140/83     Pulse Rate 04/12/19 1348 84     Resp 04/12/19 1348 18     Temp 04/12/19 1348 98.1 F (36.7 C)     Temp Source 04/12/19 1348 Oral     SpO2 04/12/19 1348 100 %     Weight --      Height --      Head Circumference --      Peak Flow --      Pain Score 04/12/19 1347 10     Pain Loc --      Pain Edu? --      Excl. in [place]? --    No data found.  Updated Vital Signs BP 140/83 (BP Location: Right Arm)   Pulse 84   Temp 98.1 F (36.7 C) (Oral)   Resp 18   SpO2 100%   Visual Acuity Right Eye  Distance:   Left Eye Distance:   Bilateral Distance:    Right Eye Near:   Left Eye Near:    Bilateral Near:     Physical Exam Constitutional:      Appearance: Normal appearance.     Comments: Patient tearful  HENT:     Head: Normocephalic and atraumatic.     Nose: Nose normal.  Eyes:     Conjunctiva/sclera: Conjunctivae normal.  Pulmonary:     Effort: Pulmonary effort is normal.  Genitourinary:    Vagina: No vaginal discharge.       Comments: Severe swelling, redness, induration to the left labia majora with weeping and mild odor.   Musculoskeletal: Normal range of motion.  Skin:    General: Skin is warm and dry.     Findings: Erythema present.  Neurological:     Mental Status: She is alert.  Psychiatric:        Mood and Affect: Mood normal.      UC Treatments / Results  Labs (all labs ordered are listed, but only abnormal results are displayed) Labs Reviewed - No data to display  EKG None  Radiology No results found.  Procedures Procedures (including critical care time)  Medications Ordered in UC Medications - No data to display  Initial Impression / Assessment and Plan / UC Course  I have reviewed the triage vital signs and the nursing notes.  Pertinent labs & imaging results that were available during my care of the patient were reviewed by me and considered in my medical decision making (see chart for details).     Cellulitis of the left labia majora  Treating with doxycycline and naproxen as needed for pain and inflammation Instructed to follow-up for continued or worsening symptoms Final Clinical Impressions(s) / UC Diagnoses   Final diagnoses:  Cellulitis of other specified site     Discharge Instructions     We are treating for the skin infection with doxycycline You can take naproxen twice a day as needed for pain and inflammation Follow up as needed for continued or worsening symptoms     ED Prescriptions    Medication Sig  Dispense Auth. Provider   doxycycline (VIBRAMYCIN) 100 MG capsule Take 1 capsule (100 mg total) by mouth 2 (two) times daily. 20 capsule Denetta Fei A, NP   naproxen (NAPROSYN) 500 MG tablet Take 1 tablet (500 mg total) by mouth 2 (two) times daily. 30 tablet Loura Halt A, NP     Controlled Substance Prescriptions [place] Controlled Substance Registry consulted? Not Applicable   Orvan July, NP 04/15/19 1229

## 2019-04-17 NOTE — Telephone Encounter (Signed)
Opened in error

## 2019-04-24 ENCOUNTER — Ambulatory Visit: Payer: BLUE CROSS/BLUE SHIELD | Admitting: Obstetrics and Gynecology

## 2019-05-07 ENCOUNTER — Other Ambulatory Visit: Payer: Self-pay

## 2019-05-07 ENCOUNTER — Telehealth (INDEPENDENT_AMBULATORY_CARE_PROVIDER_SITE_OTHER): Payer: BLUE CROSS/BLUE SHIELD | Admitting: Obstetrics and Gynecology

## 2019-05-07 ENCOUNTER — Encounter: Payer: Self-pay | Admitting: Obstetrics and Gynecology

## 2019-05-07 DIAGNOSIS — N939 Abnormal uterine and vaginal bleeding, unspecified: Secondary | ICD-10-CM

## 2019-05-07 DIAGNOSIS — L732 Hidradenitis suppurativa: Secondary | ICD-10-CM | POA: Diagnosis not present

## 2019-05-07 MED ORDER — MEGESTROL ACETATE 40 MG PO TABS: 40.0000 mg | ORAL_TABLET | Freq: Two times a day (BID) | ORAL | 5 refills | Status: DC

## 2019-05-07 NOTE — Progress Notes (Signed)
I connected with  [name] on 05/07/19 at  9:35 AM EDT by telephone and verified that I am speaking with the correct person using two identifiers.   I discussed the limitations, risks, security and privacy concerns of performing an evaluation and management service by telephone and the availability of in person appointments. I also discussed with the patient that there may be a patient responsible charge related to this service. The patient expressed understanding and agreed to proceed.  [place], [place] 05/07/2019  9:34 AM

## 2019-05-07 NOTE — Progress Notes (Signed)
   TELEHEALTH VIRTUAL GYNECOLOGY VISIT ENCOUNTER NOTE  I connected with Rachel Mckinney on 05/07/19 at  9:35 AM EDT by telephone at home and verified that I am speaking with the correct person using two identifiers.   I discussed the limitations, risks, security and privacy concerns of performing an evaluation and management service by telephone and the availability of in person appointments. I also discussed with the patient that there may be a patient responsible charge related to this service. The patient expressed understanding and agreed to proceed.   History:  Rachel Mckinney is a 43 y.o. [id number] female being evaluated today for AUB. Reports she has been bleeding ever since the EMB in 11/2018. Reports she sometimes has light bleeding but now having heavy clots daily, they are not stopping. Used to stop with megace but she ran out of that medication and now is bleeding heavily almost daily. Reports she is also starting to feel fatigued. She has hidra-adenitis in groin area and her periods are making her hidradenitis worse as the moisture is making it very difficult to treat.      Past Medical History:  Diagnosis Date  . Boils    under breast and inner thigh  . Hidradenitis suppurativa    Chronic problem for patient, multiple admissions for surgical drainage  . Hypertension    Past Surgical History:  Procedure Laterality Date  . CESAREAN SECTION    . multiple abscess drainages     The following portions of the patient's history were reviewed and updated as appropriate: allergies, current medications, past family history, past medical history, past social history, past surgical history and problem list.   Health Maintenance:  Normal pap and negative HRHPV on 12/03/18.  Normal mammogram on 12/2017, due for mammogram.   Review of Systems:  Pertinent items noted in HPI and remainder of comprehensive ROS otherwise negative.  Physical Exam:   General:  Alert, oriented and cooperative.    Mental Status: Normal mood and affect perceived. Normal judgment and thought content.  Physical exam deferred due to nature of the encounter  Labs and Imaging No results found for this or any previous visit (from the past 336 hour(s)). No results found.    Assessment and Plan:   1. Abnormal uterine bleeding (AUB) Has had heavy bleeding ongoing, megace helps but not long term solution. Her hidradenitis is worse because of periods. Recommended IUD, reviewed risks/benefits. Reviewed if this doesn't work, would move on to other options, she is agreeable to Mirena IUD insertion. Will cont megace in meantime, instructed in correct administration of megace (was taking prn for bleeding). Answered all questions. Will return for Mirena IUD insertion.  2. Hidradenitis Per PCP   I discussed the assessment and treatment plan with the patient. The patient was provided an opportunity to ask questions and all were answered. The patient agreed with the plan and demonstrated an understanding of the instructions.   The patient was advised to call back or seek an in-person evaluation/go to the ED if the symptoms worsen or if the condition fails to improve as anticipated.  I provided 25 minutes of non-face-to-face time during this encounter.   Sloan Leiter, MD [facility] for [place], [place]

## 2019-05-14 ENCOUNTER — Encounter: Payer: Self-pay | Admitting: Obstetrics and Gynecology

## 2019-05-14 ENCOUNTER — Other Ambulatory Visit: Payer: Self-pay

## 2019-05-14 ENCOUNTER — Ambulatory Visit (INDEPENDENT_AMBULATORY_CARE_PROVIDER_SITE_OTHER): Payer: BLUE CROSS/BLUE SHIELD | Admitting: Obstetrics and Gynecology

## 2019-05-14 VITALS — BP 150/97 | HR 94 | Wt 239.8 lb

## 2019-05-14 DIAGNOSIS — N939 Abnormal uterine and vaginal bleeding, unspecified: Secondary | ICD-10-CM

## 2019-05-14 LAB — POCT PREGNANCY, URINE: Preg Test, Ur: NEGATIVE

## 2019-05-14 MED ORDER — MEDROXYPROGESTERONE ACETATE 150 MG/ML IM SUSP
150.0000 mg | Freq: Once | INTRAMUSCULAR | Status: AC
  Administered 2019-05-14: 150 mg via INTRAMUSCULAR

## 2019-05-14 NOTE — Progress Notes (Signed)
GYNECOLOGY OFFICE FOLLOW UP NOTE  History:  43 y.o. [id number] here today for follow up for AUB. Bleeding has stopped since she started Megace BID as directed last visit. Originally scheduled for IUD insertion, however patient here with questions. She is scheduled for surgery hidradenitis in August and her PCP is concerned about needing to have IUD removed prior to surgery, suggested she get OCPs instead.   Past Medical History:  Diagnosis Date  . Boils    under breast and inner thigh  . Hidradenitis suppurativa    Chronic problem for patient, multiple admissions for surgical drainage  . Hypertension     Past Surgical History:  Procedure Laterality Date  . CESAREAN SECTION    . multiple abscess drainages       Current Outpatient Medications:  .  Adalimumab (HUMIRA [place]), Inject into the skin. Dose uncertain.  Being prescribed by derm, Disp: , Rfl:  .  doxycycline (VIBRAMYCIN) 100 MG capsule, Take 1 capsule (100 mg total) by mouth 2 (two) times daily., Disp: 20 capsule, Rfl: 0 .  ibuprofen (ADVIL,MOTRIN) 600 MG tablet, Take 1 tablet (600 mg total) by mouth every 8 (eight) hours as needed., Disp: 30 tablet, Rfl: 1 .  megestrol (MEGACE) 40 MG tablet, Take 1 tablet (40 mg total) by mouth 2 (two) times daily., Disp: 60 tablet, Rfl: 2 .  megestrol (MEGACE) 40 MG tablet, Take 1 tablet (40 mg total) by mouth 2 (two) times daily. Can increase to two tablets twice a day in the event of heavy bleeding, Disp: 60 tablet, Rfl: 5 .  naproxen (NAPROSYN) 500 MG tablet, Take 1 tablet (500 mg total) by mouth 2 (two) times daily., Disp: 30 tablet, Rfl: 0 .  OVER THE COUNTER MEDICATION, Apply 1 application topically daily. Magnesium sulfate cream, Disp: , Rfl:  .  spironolactone (ALDACTONE) 100 MG tablet, Take 50 mg by mouth daily. , Disp: , Rfl:   Current Facility-Administered Medications:  .  betamethasone acetate-betamethasone sodium phosphate (CELESTONE) injection 3 mg, 3 mg, Intramuscular, Once,  Edrick Kins, DPM  The following portions of the patient's history were reviewed and updated as appropriate: allergies, current medications, past family history, past medical history, past social history, past surgical history and problem list.   Review of Systems:  Pertinent items noted in HPI and remainder of comprehensive ROS otherwise negative.   Objective:  Physical Exam BP (!) 150/97   Pulse 94   Wt 239 lb 12.8 oz (108.8 kg)   BMI 39.90 kg/m  CONSTITUTIONAL: Well-developed, well-nourished female in no acute distress.  HENT:  Normocephalic, atraumatic. External right and left ear normal. Oropharynx is clear and moist EYES: Conjunctivae and EOM are normal. Pupils are equal, round, and reactive to light. No scleral icterus.  NECK: Normal range of motion, supple, no masses SKIN: Skin is warm and dry. No rash noted. Not diaphoretic. No erythema. No pallor. NEUROLOGIC: Alert and oriented to person, place, and time. Normal reflexes, muscle tone coordination. No cranial nerve deficit noted. PSYCHIATRIC: Normal mood and affect. Normal behavior. Normal judgment and thought content. CARDIOVASCULAR: Normal heart rate noted RESPIRATORY: Effort normal, no problems with respiration noted ABDOMEN: Soft, no distention noted.   PELVIC: deferred MUSCULOSKELETAL: Normal range of motion. No edema noted.  Labs and Imaging No results found.  Assessment & Plan:  1. Abnormal uterine bleeding (AUB) - Reviewed LngIUD for management of AUB, that this is long term options that requires very little maintenance. Reviewed that if she has it placed,  that she would NOT need to have it removed and replaced prior to any surgery, that it is safe for her to have surgery while it is in place. Reviewed options for AUB control including progesterone based hormonal control as well as surgical methods of control. Reviewed risks of cardiovascular events including stroke/clot with OCPs (estrogen/progesterone based  methods) and that she is NOT a candidate for OCPs given her hypertension and smoking status.  - Reviewed other options for hormonal control including depo and LngIUD. She has been on depo In the past and did not have bleeding while on it, she is interested in trying this again. Reviewed risks of depo, she is agreeable to try. Reviewed that depo may increase her bleeding, and if she has heavy bleeding, may need to restart megace. Reviewed that she should not be on depo long term, but short term, would be helpful to decrease bleeding as she wishes to avoid IUD prior to her hidradenitis surgery. Reviewed she may opt for IUD at any time if depo does not control bleeding. Reviewed next steps after IUD if IUD fails would be to consider surgery. She verbalizes understanding of all of the above and would like to proceed with depo. Has not been sexually active since the beginning of March, neg UPT. Will give depo today and consider IUD if no improvement in bleeding. Stop Megace today. - POCT urine pregnancy   Routine preventative health maintenance measures emphasized. Please refer to After Visit Summary for other counseling recommendations.   Return in about 3 months (around 08/14/2019) for Followup.  Total face-to-face time with patient: 20 minutes. Over 50% of encounter was spent on counseling and coordination of care.  Feliz Beam, M.D. Attending Center for [employer] farm manager)

## 2019-07-25 ENCOUNTER — Telehealth: Payer: Self-pay | Admitting: Obstetrics & Gynecology

## 2019-07-25 NOTE — Telephone Encounter (Signed)
Called the patient and left a detailed voicemail of the covid19 restrictions as well as if she has been in close contact with someone who has had covid, diagnosed with [phone], or experienced any flu like symptoms such as fever, rash, sore throat, or shortness of breath please call our office to reschedule.

## 2019-07-26 ENCOUNTER — Other Ambulatory Visit: Payer: Self-pay

## 2019-07-26 ENCOUNTER — Ambulatory Visit (INDEPENDENT_AMBULATORY_CARE_PROVIDER_SITE_OTHER): Payer: BLUE CROSS/BLUE SHIELD | Admitting: Family Medicine

## 2019-07-26 ENCOUNTER — Encounter: Payer: Self-pay | Admitting: Family Medicine

## 2019-07-26 VITALS — BP 164/97 | HR 90 | Temp 98.4°F | Wt 235.1 lb

## 2019-07-26 DIAGNOSIS — N939 Abnormal uterine and vaginal bleeding, unspecified: Secondary | ICD-10-CM

## 2019-07-26 DIAGNOSIS — Z30430 Encounter for insertion of intrauterine contraceptive device: Secondary | ICD-10-CM | POA: Diagnosis not present

## 2019-07-26 LAB — POCT PREGNANCY, URINE: Preg Test, Ur: NEGATIVE

## 2019-07-26 MED ORDER — LEVONORGESTREL 19.5 MCG/DAY IU IUD
INTRAUTERINE_SYSTEM | Freq: Once | INTRAUTERINE | Status: AC
  Administered 2019-07-26: 11:00:00 via INTRAUTERINE

## 2019-07-26 NOTE — Progress Notes (Signed)
    GYNECOLOGY OFFICE PROCEDURE NOTE  DLISA BARNWELL is a 43 y.o. [id number] here for [place] IUD insertion for bleeding. On Megace and Depo and now going to try IUD due to bleeding.. No GYN concerns.  Last pap smear was on 12/03/18 and was normal.  IUD Insertion Procedure Note Patient identified, informed consent performed, consent signed.   Discussed risks of irregular bleeding, cramping, infection, malpositioning or misplacement of the IUD outside the uterus which may require further procedure such as laparoscopy. Also discussed >99% contraception efficacy, increased risk of ectopic pregnancy with failure of method.  Time out was performed.  Urine pregnancy test negative.  Speculum placed in the vagina.  Cervix visualized.  Cleaned with Betadine x 2.  Grasped anteriorly with a single tooth tenaculum.  Uterus sounded to 11 cm.  [place] IUD placed per manufacturer's recommendations.  Strings trimmed to 3 cm. Tenaculum was removed, good hemostasis noted.  Patient tolerated procedure well.   Patient was given post-procedure instructions.  She was advised to have backup contraception for one week.  Patient was also asked to check IUD strings periodically.  Donnamae Jude 07/26/2019 12:19 PM

## 2019-07-26 NOTE — Patient Instructions (Signed)
Intrauterine Device Insertion, Care After  This sheet gives you information about how to care for yourself after your procedure. Your health care provider may also give you more specific instructions. If you have problems or questions, contact your health care provider. What can I expect after the procedure? After the procedure, it is common to have:  Cramps and pain in the abdomen.  Light bleeding (spotting) or heavier bleeding that is like your menstrual period. This may last for up to a few days.  Lower back pain.  Dizziness.  Headaches.  Nausea. Follow these instructions at home:  Before resuming sexual activity, check to make sure that you can feel the IUD string(s). You should be able to feel the end of the string(s) below the opening of your cervix. If your IUD string is in place, you may resume sexual activity. ? If you had a hormonal IUD inserted more than 7 days after your most recent period started, you will need to use a backup method of birth control for 7 days after IUD insertion. Ask your health care provider whether this applies to you.  Continue to check that the IUD is still in place by feeling for the string(s) after every menstrual period, or once a month.  Take over-the-counter and prescription medicines only as told by your health care provider.  Do not drive or use heavy machinery while taking prescription pain medicine.  Keep all follow-up visits as told by your health care provider. This is important. Contact a health care provider if:  You have bleeding that is heavier or lasts longer than a normal menstrual cycle.  You have a fever.  You have cramps or abdominal pain that get worse or do not get better with medicine.  You develop abdominal pain that is new or is not in the same area of earlier cramping and pain.  You feel lightheaded or weak.  You have abnormal or bad-smelling discharge from your vagina.  You have pain during sexual activity.   You have any of the following problems with your IUD string(s): ? The string bothers or hurts you or your sexual partner. ? You cannot feel the string. ? The string has gotten longer.  You can feel the IUD in your vagina.  You think you may be pregnant, or you miss your menstrual period.  You think you may have an STI (sexually transmitted infection). Get help right away if:  You have flu-like symptoms.  You have a fever and chills.  You can feel that your IUD has slipped out of place. Summary  After the procedure, it is common to have cramps and pain in the abdomen. It is also common to have light bleeding (spotting) or heavier bleeding that is like your menstrual period.  Continue to check that the IUD is still in place by feeling for the string(s) after every menstrual period, or once a month.  Keep all follow-up visits as told by your health care provider. This is important.  Contact your health care provider if you have problems with your IUD string(s), such as the string getting longer or bothering you or your sexual partner. This information is not intended to replace advice given to you by your health care provider. Make sure you discuss any questions you have with your health care provider. Document Released: 08/03/2011 Document Revised: 11/17/2017 Document Reviewed: 10/26/2016 Elsevier Patient Education  2020 [employer].

## 2019-07-30 ENCOUNTER — Encounter: Payer: Self-pay | Admitting: *Deleted

## 2019-08-05 ENCOUNTER — Telehealth (INDEPENDENT_AMBULATORY_CARE_PROVIDER_SITE_OTHER): Payer: Self-pay | Admitting: *Deleted

## 2019-08-05 DIAGNOSIS — N939 Abnormal uterine and vaginal bleeding, unspecified: Secondary | ICD-10-CM

## 2019-08-05 NOTE — Telephone Encounter (Signed)
Rachel Mckinney left a voice message this afternoon stating she is calling to see if she needs to schedule an appointment. States she got an IUD and started bleeding. She states use used a tampon because she didn't know she shouldn't and has been having really bad abdominal cramps from legs to bottom of her stomach ; also nausea and vomiting. She wants to know if she should come in  Or do something . States still bleeding like a period.

## 2019-08-07 MED ORDER — MEGESTROL ACETATE 40 MG PO TABS: 40.0000 mg | ORAL_TABLET | Freq: Two times a day (BID) | ORAL | 5 refills | Status: DC

## 2019-08-07 NOTE — Telephone Encounter (Signed)
I called Rachel Mckinney and we discussed she had IUD inserted 8/7 for history of abnormal bleeding and had tried depo-provera. She reports she has been bleeding everyday since then. States she changes her pad about 3 times per day. C/o bad cramping in her lower abdomen and thighs. C/o twice has felt hot and threw up. She also has hx fibroids. We discussed her body is still adjusting to the medication in the IUD and we allow 3-6 months to really see how her body is going to respond to the IUD ; that it does not fix abnormal bleeding right away.  I advised her to take ibuprofen 600 every 6 hours as needed for the cramping and to be sure she is hydrated.  She states she is also taking the megace bid . I instructed her to wait longer to see if her bleeding gets better since it has not even been a month. I advised her to call us back if her bleeding gets worse , and that I would send this note to Dr. Kennon Rounds and if she  Had any other reccomendations, we would let her know.  Kyheem Bathgate,RN

## 2019-08-07 NOTE — Addendum Note (Signed)
Addended by: Donnamae Jude on: 08/07/2019 05:24 PM   Modules accepted: Orders

## 2019-08-08 MED ORDER — IBUPROFEN 600 MG PO TABS: 600.0000 mg | ORAL_TABLET | Freq: Three times a day (TID) | ORAL | 1 refills | Status: DC | PRN

## 2019-08-08 MED ORDER — MEGESTROL ACETATE 40 MG PO TABS: 40.0000 mg | ORAL_TABLET | Freq: Two times a day (BID) | ORAL | 5 refills | Status: DC

## 2019-08-08 NOTE — Telephone Encounter (Signed)
Called pt to inform her recommendations for increasing her Megase to 2 tablets BID per Dr. Kennon Rounds and then if bleeding is not improved she can increase to TID. Pt voiced understanding. Discussed with pt that it can take 3-6 months before her cycles regulate on the IUD. Pt voiced understanding.   Pt requested refill on Ibuprofen for pain she is experiencing. Refill sent to Pharmacy.   Pt to call with further questions/concerns as needed.

## 2019-08-08 NOTE — Addendum Note (Signed)
Addended by: Donn Pierini on: 08/08/2019 08:53 AM   Modules accepted: Orders

## 2020-01-01 DIAGNOSIS — N906 Unspecified hypertrophy of vulva: Secondary | ICD-10-CM | POA: Insufficient documentation

## 2020-01-01 HISTORY — DX: Unspecified hypertrophy of vulva: N90.60

## 2020-01-08 ENCOUNTER — Encounter (HOSPITAL_COMMUNITY): Payer: Self-pay

## 2020-01-08 ENCOUNTER — Ambulatory Visit (HOSPITAL_COMMUNITY)
Admission: EM | Admit: 2020-01-08 | Discharge: 2020-01-08 | Disposition: A | Discharge status: Home / Self Care | Payer: BLUE CROSS/BLUE SHIELD | Attending: Family Medicine | Admitting: Family Medicine

## 2020-01-08 ENCOUNTER — Other Ambulatory Visit: Payer: Self-pay

## 2020-01-08 DIAGNOSIS — L732 Hidradenitis suppurativa: Secondary | ICD-10-CM | POA: Diagnosis not present

## 2020-01-08 DIAGNOSIS — R030 Elevated blood-pressure reading, without diagnosis of hypertension: Secondary | ICD-10-CM

## 2020-01-08 MED ORDER — IBUPROFEN 600 MG PO TABS: 600.0000 mg | ORAL_TABLET | Freq: Four times a day (QID) | ORAL | 0 refills | Status: DC | PRN

## 2020-01-08 MED ORDER — HYDROCODONE-ACETAMINOPHEN 5-325 MG PO TABS: 1.0000 | ORAL_TABLET | Freq: Four times a day (QID) | ORAL | 0 refills | Status: DC | PRN

## 2020-01-08 MED ORDER — DOXYCYCLINE HYCLATE 100 MG PO TABS: 100.0000 mg | ORAL_TABLET | Freq: Two times a day (BID) | ORAL | 0 refills | Status: AC

## 2020-01-08 NOTE — ED Notes (Signed)
163/105 reported to [employer].Marland Kitchen abscess burst and pt is in pain.

## 2020-01-08 NOTE — Discharge Instructions (Addendum)
Please try to check your blood pressure before you start using ibuprofen  Please follow up in 2 days if your symptoms are not improving

## 2020-01-08 NOTE — ED Provider Notes (Signed)
[facility]    CSN: [id number] Arrival date & time: 01/08/20  1818      History   Chief Complaint Chief Complaint  Patient presents with  . Appointment    630  . Abscess    HPI Rachel Mckinney is a 44 y.o. female.   Presenting with pain and swelling over the left inguinal region and of the left labia.  She has a long history of hidradenitis and is scheduled for surgery next month.  Most recently had an boil that appeared in the proximal left medial thigh.  She placed warm washcloth on this area and it popped.  Has been having ongoing pain and swelling in this region.  Has had this on multiple occasions.  Pain is severe.  No fevers or chills.  HPI  Past Medical History:  Diagnosis Date  . Boils    under breast and inner thigh  . Hidradenitis suppurativa    Chronic problem for patient, multiple admissions for surgical drainage  . Hypertension     Patient Active Problem List   Diagnosis Date Noted  . Vaginal discharge 10/26/2016  . Possible exposure to STD 05/24/2016  . Trichomonal vaginitis 05/24/2016  . Abnormal uterine bleeding 03/02/2016  . Intertriginous candidiasis 01/26/2016  . Headache 01/26/2016  . Tobacco abuse 05/12/2015  . HTN (hypertension) 05/12/2015  . Menorrhagia 05/12/2015  . Preventative health care 09/03/2012  . Hidradenitis suppurativa-chronic and involving multiple sites 02/10/2012  . Morbid obesity ([place]) 11/16/2011    Past Surgical History:  Procedure Laterality Date  . CESAREAN SECTION    . multiple abscess drainages      OB History    Gravida  3   Para      Term      Preterm      AB  2   Living  1     SAB      TAB      Ectopic      Multiple      Live Births  1            Home Medications    Prior to Admission medications   Medication Sig Start Date End Date Taking? Authorizing Provider  doxycycline (VIBRA-TABS) 100 MG tablet Take 1 tablet (100 mg total) by mouth 2 (two) times daily for 7 days.  01/08/20 01/15/20  Rosemarie Ax, MD  HYDROcodone-acetaminophen (NORCO/VICODIN) 5-325 MG tablet Take 1 tablet by mouth every 6 (six) hours as needed. 01/08/20   Rosemarie Ax, MD  ibuprofen (ADVIL) 600 MG tablet Take 1 tablet (600 mg total) by mouth every 6 (six) hours as needed. 01/08/20   Rosemarie Ax, MD  inFLIXimab in sodium chloride 0.9 % Inject into the vein.    [provider]  megestrol (MEGACE) 40 MG tablet Take 1 tablet (40 mg total) by mouth 2 (two) times daily. Can increase to two tablets twice a day in the event of heavy bleeding 08/08/19   Clovia Cuff C, MD  naproxen (NAPROSYN) 500 MG tablet Take 1 tablet (500 mg total) by mouth 2 (two) times daily. Patient not taking: Reported on 07/26/2019 04/12/19   Orvan July, NP  OVER THE COUNTER MEDICATION Apply 1 application topically daily. Magnesium sulfate cream    [provider]  spironolactone (ALDACTONE) 100 MG tablet Take 50 mg by mouth daily.     [provider]    Family History Family History  Problem Relation Age of Onset  .  Liver disease Father   . Hypertension Father   . Hypertension Sister     Social History Social History   Tobacco Use  . Smoking status: Current Every Day Smoker    Packs/day: 0.10    Types: Cigarettes  . Smokeless tobacco: Never Used  Substance Use Topics  . Alcohol use: Yes    Comment: on weekends  . Drug use: Yes    Types: Marijuana    Comment: occas use of marijuana     Allergies   Patient has no known allergies.   Review of Systems Review of Systems See hpi   Physical Exam Triage Vital Signs ED Triage Vitals  Enc Vitals Group     BP 01/08/20 1838 (!) 163/105     Pulse Rate 01/08/20 1838 82     Resp 01/08/20 1838 18     Temp 01/08/20 1838 98.9 F (37.2 C)     Temp src --      SpO2 01/08/20 1838 98 %     Weight 01/08/20 1837 237 lb (107.5 kg)     Height --      Head Circumference --      Peak Flow --      Pain Score 01/08/20 1837 10      Pain Loc --      Pain Edu? --      Excl. in [place]? --    No data found.  Updated Vital Signs BP (!) 163/105 (BP Location: Right Arm)   Pulse 82   Temp 98.9 F (37.2 C)   Resp 18   Wt 107.5 kg   SpO2 98%   BMI 39.44 kg/m   Visual Acuity Right Eye Distance:   Left Eye Distance:   Bilateral Distance:    Right Eye Near:   Left Eye Near:    Bilateral Near:     Physical Exam Gen: NAD, alert, cooperative with exam,  ENT: normal lips, normal nasal mucosa,  Eye: normal EOM, normal conjunctiva and lids CV:  no edema, +2 pedal pulses   Resp: no accessory muscle use, non-labored,   Skin: Several chronic sinus tracts in the left inguinal region and a large labia.  Induration occurring in the left inguinal region with open abscess . Neuro: normal tone, normal sensation to touch Psych:  normal insight, alert and oriented        UC Treatments / Results  Labs (all labs ordered are listed, but only abnormal results are displayed) Labs Reviewed - No data to display  EKG   Radiology No results found.  Procedures Procedures (including critical care time)  Medications Ordered in UC Medications - No data to display  Initial Impression / Assessment and Plan / UC Course  I have reviewed the triage vital signs and the nursing notes.  Pertinent labs & imaging results that were available during my care of the patient were reviewed by me and considered in my medical decision making (see chart for details).     Rachel Mckinney is a 44 year old female that is presenting with an acute infection or flare of her hidradenitis.  No area of fluctuance in order to drain.  Will provide with doxycycline, Norco and ibuprofen.  Counseled to check her blood pressure as it was elevated through the course of today.  Bandaged the area and counseled on supportive care.  Given indications to follow-up in 2 days if symptoms or not improving.  Provided work note.  Final Clinical Impressions(s) / UC  Diagnoses  Final diagnoses:  Hidradenitis suppurativa     Discharge Instructions     Please try to check your blood pressure before you start using ibuprofen  Please follow up in 2 days if your symptoms are not improving      ED Prescriptions    Medication Sig Dispense Auth. Provider   doxycycline (VIBRA-TABS) 100 MG tablet Take 1 tablet (100 mg total) by mouth 2 (two) times daily for 7 days. 14 tablet Rosemarie Ax, MD   HYDROcodone-acetaminophen (NORCO/VICODIN) 5-325 MG tablet Take 1 tablet by mouth every 6 (six) hours as needed. 10 tablet Rosemarie Ax, MD   ibuprofen (ADVIL) 600 MG tablet Take 1 tablet (600 mg total) by mouth every 6 (six) hours as needed. 30 tablet Rosemarie Ax, MD     I have reviewed the PDMP during this encounter.   Rosemarie Ax, MD 01/08/20 [id number]

## 2020-01-08 NOTE — ED Triage Notes (Signed)
Pt state she has abscess on her left inner thigh. X 4 days. Pt state sit has burst .

## 2020-02-21 HISTORY — PX: VULVECTOMY PARTIAL: SHX6187

## 2020-06-29 DIAGNOSIS — Z975 Presence of (intrauterine) contraceptive device: Secondary | ICD-10-CM

## 2020-06-29 HISTORY — DX: Presence of (intrauterine) contraceptive device: Z97.5

## 2020-09-08 ENCOUNTER — Ambulatory Visit
Admission: RE | Admit: 2020-09-08 | Discharge: 2020-09-08 | Disposition: A | Discharge status: Home / Self Care | Payer: Medicaid Other | Source: Ambulatory Visit | Attending: Emergency Medicine | Admitting: Emergency Medicine

## 2020-09-08 ENCOUNTER — Other Ambulatory Visit: Payer: Self-pay

## 2020-09-08 VITALS — BP 149/88 | HR 75 | Temp 98.1°F | Resp 16

## 2020-09-08 DIAGNOSIS — M2550 Pain in unspecified joint: Secondary | ICD-10-CM

## 2020-09-08 MED ORDER — CYCLOBENZAPRINE HCL 5 MG PO TABS: 5.0000 mg | ORAL_TABLET | Freq: Two times a day (BID) | ORAL | 0 refills | Status: AC | PRN

## 2020-09-08 MED ORDER — PREDNISONE 10 MG (21) PO TBPK: ORAL_TABLET | Freq: Every day | ORAL | 0 refills | Status: DC

## 2020-09-08 NOTE — ED Triage Notes (Signed)
Pain and swelling in right foot for several days.   Joint pain in upper extremities, neck. Hard to raise arms to do hair and do ADLs. She has had a similar episode 2 years ago, was told it was potentially lupus.   Fatigue and decreased appetite as well.

## 2020-09-08 NOTE — ED Provider Notes (Signed)
[facility]    CSN: [id number] Arrival date & time: 09/08/20  1000      History   Chief Complaint Chief Complaint  Patient presents with  . Appointment  . Joint Pain  . Fatigue    HPI Rachel Mckinney is a 44 y.o. female  Presenting for generalized joint pain. Patient states she is had difficulty doing ADLs, brushing hair due to pain. No injury, change in diet, lifestyle, medications. States she had this happen a few years ago and was told "it may be lupus ". Has not been followed by rheumatology. Does have a PCP appointment next week: Intends to keep. No fever, chest pain, palpitations, change in urinary or bowel habit. Patient did have preceding rhinorrhea, dry cough. Is Covid vaccinated.  Past Medical History:  Diagnosis Date  . Boils    under breast and inner thigh  . Hidradenitis suppurativa    Chronic problem for patient, multiple admissions for surgical drainage  . Hypertension     Patient Active Problem List   Diagnosis Date Noted  . Vaginal discharge 10/26/2016  . Possible exposure to STD 05/24/2016  . Trichomonal vaginitis 05/24/2016  . Abnormal uterine bleeding 03/02/2016  . Intertriginous candidiasis 01/26/2016  . Headache 01/26/2016  . Tobacco abuse 05/12/2015  . HTN (hypertension) 05/12/2015  . Menorrhagia 05/12/2015  . Preventative health care 09/03/2012  . Hidradenitis suppurativa-chronic and involving multiple sites 02/10/2012  . Morbid obesity ([place]) 11/16/2011    Past Surgical History:  Procedure Laterality Date  . CESAREAN SECTION    . multiple abscess drainages      OB History    Gravida  3   Para      Term      Preterm      AB  2   Living  1     SAB      TAB      Ectopic      Multiple      Live Births  1            Home Medications    Prior to Admission medications   Medication Sig Start Date End Date Taking? Authorizing Provider  ibuprofen (ADVIL) 600 MG tablet Take 1 tablet (600 mg total) by  mouth every 6 (six) hours as needed. 01/08/20  Yes Rosemarie Ax, MD  cyclobenzaprine (FLEXERIL) 5 MG tablet Take 1 tablet (5 mg total) by mouth 2 (two) times daily as needed for up to 7 days for muscle spasms. 09/08/20 09/15/20  Hall-Potvin, [place], PA-C  HYDROcodone-acetaminophen (NORCO/VICODIN) 5-325 MG tablet Take 1 tablet by mouth every 6 (six) hours as needed. 01/08/20   Rosemarie Ax, MD  inFLIXimab in sodium chloride 0.9 % Inject into the vein.    [provider]  megestrol (MEGACE) 40 MG tablet Take 1 tablet (40 mg total) by mouth 2 (two) times daily. Can increase to two tablets twice a day in the event of heavy bleeding 08/08/19   Clovia Cuff C, MD  naproxen (NAPROSYN) 500 MG tablet Take 1 tablet (500 mg total) by mouth 2 (two) times daily. Patient not taking: Reported on 07/26/2019 04/12/19   Orvan July, NP  OVER THE COUNTER MEDICATION Apply 1 application topically daily. Magnesium sulfate cream    [provider]  predniSONE (STERAPRED UNI-PAK 21 TAB) 10 MG (21) TBPK tablet Take by mouth daily. Take steroid taper as written 09/08/20   Hall-Potvin, [place], PA-C  spironolactone (ALDACTONE) 100 MG tablet Take  50 mg by mouth daily.     [provider]    Family History Family History  Problem Relation Age of Onset  . Liver disease Father   . Hypertension Father   . Hypertension Sister     Social History Social History   Tobacco Use  . Smoking status: Current Every Day Smoker    Packs/day: 0.10    Types: Cigarettes  . Smokeless tobacco: Never Used  Substance Use Topics  . Alcohol use: Yes    Comment: on weekends  . Drug use: Yes    Types: Marijuana    Comment: occas use of marijuana     Allergies   Patient has no known allergies.   Review of Systems As per HPI   Physical Exam Triage Vital Signs ED Triage Vitals  Enc Vitals Group     BP 09/08/20 1016 (!) 149/88     Pulse Rate 09/08/20 1016 75     Resp 09/08/20 1016 16     Temp  09/08/20 1016 98.1 F (36.7 C)     Temp Source 09/08/20 1016 Oral     SpO2 09/08/20 1016 99 %     Weight --      Height --      Head Circumference --      Peak Flow --      Pain Score 09/08/20 1014 8     Pain Loc --      Pain Edu? --      Excl. in [place]? --    No data found.  Updated Vital Signs BP (!) 149/88   Pulse 75   Temp 98.1 F (36.7 C) (Oral)   Resp 16   SpO2 99%   Visual Acuity Right Eye Distance:   Left Eye Distance:   Bilateral Distance:    Right Eye Near:   Left Eye Near:    Bilateral Near:     Physical Exam Constitutional:      General: She is not in acute distress. HENT:     Head: Normocephalic and atraumatic.  Eyes:     General: No scleral icterus.    Pupils: Pupils are equal, round, and reactive to light.  Cardiovascular:     Rate and Rhythm: Normal rate.  Pulmonary:     Effort: Pulmonary effort is normal.  Musculoskeletal:     Comments: No bony deformity. Patient does have diffuse bony tenderness without crepitus, fluctuance, erythema.  Skin:    Coloration: Skin is not jaundiced or pale.  Neurological:     Mental Status: She is alert and oriented to person, place, and time.      UC Treatments / Results  Labs (all labs ordered are listed, but only abnormal results are displayed) Labs Reviewed  NOVEL CORONAVIRUS, NAA    EKG   Radiology No results found.  Procedures Procedures (including critical care time)  Medications Ordered in UC Medications - No data to display  Initial Impression / Assessment and Plan / UC Course  I have reviewed the triage vital signs and the nursing notes.  Pertinent labs & imaging results that were available during my care of the patient were reviewed by me and considered in my medical decision making (see chart for details).     Patient afebrile, nontoxic, with SpO2 99%.  Covid PCR pending.  Patient to quarantine until results are back.  We will treat supportively as outlined below.  Return  precautions discussed, patient verbalized understanding and is agreeable to plan.  Final Clinical Impressions(s) / UC Diagnoses   Final diagnoses:  Polyarthralgia     Discharge Instructions     Heat therapy (hot compress, warm wash rag, hot showers, etc.) can help relax muscles and soothe muscle aches. Cold therapy (ice packs) can be used to help swelling both after injury and after prolonged use of areas of chronic pain/aches.  Pain medication:  Tylenol only  May take muscle relaxer as needed for severe pain / spasm.  (This medication may cause you to become tired so it is important you do not drink alcohol or operate heavy machinery while on this medication.  Recommend your first dose to be taken before bedtime to monitor for side effects safely)  Important to follow up with specialist(s) below for further evaluation/management if your symptoms persist or worsen.    ED Prescriptions    Medication Sig Dispense Auth. Provider   cyclobenzaprine (FLEXERIL) 5 MG tablet Take 1 tablet (5 mg total) by mouth 2 (two) times daily as needed for up to 7 days for muscle spasms. 14 tablet Hall-Potvin, [place], PA-C   predniSONE (STERAPRED UNI-PAK 21 TAB) 10 MG (21) TBPK tablet Take by mouth daily. Take steroid taper as written 21 tablet Hall-Potvin, [place], PA-C     PDMP not reviewed this encounter.   Hall-Potvin, [place], [place] 09/08/20 1139

## 2020-09-08 NOTE — Discharge Instructions (Addendum)
Heat therapy (hot compress, warm wash rag, hot showers, etc.) can help relax muscles and soothe muscle aches. Cold therapy (ice packs) can be used to help swelling both after injury and after prolonged use of areas of chronic pain/aches.  Pain medication:  Tylenol only  May take muscle relaxer as needed for severe pain / spasm.  (This medication may cause you to become tired so it is important you do not drink alcohol or operate heavy machinery while on this medication.  Recommend your first dose to be taken before bedtime to monitor for side effects safely)  Important to follow up with specialist(s) below for further evaluation/management if your symptoms persist or worsen.

## 2020-09-10 ENCOUNTER — Ambulatory Visit (INDEPENDENT_AMBULATORY_CARE_PROVIDER_SITE_OTHER): Payer: Self-pay | Admitting: Family Medicine

## 2020-09-10 ENCOUNTER — Other Ambulatory Visit: Payer: Self-pay

## 2020-09-10 VITALS — BP 138/98 | HR 81 | Ht 65.0 in | Wt 221.2 lb

## 2020-09-10 DIAGNOSIS — M130 Polyarthritis, unspecified: Secondary | ICD-10-CM

## 2020-09-10 DIAGNOSIS — M2550 Pain in unspecified joint: Secondary | ICD-10-CM

## 2020-09-10 DIAGNOSIS — M353 Polymyalgia rheumatica: Secondary | ICD-10-CM

## 2020-09-10 LAB — NOVEL CORONAVIRUS, NAA: SARS-CoV-2, NAA: NOT DETECTED

## 2020-09-10 LAB — SARS-COV-2, NAA 2 DAY TAT

## 2020-09-10 NOTE — Assessment & Plan Note (Addendum)
There are multiple systemic complaints are certainly concerning for logical issue.  She is not certain of her previous diagnoses or previous work-up with rheumatology.  The differential at this point includes polymyalgia rheumatica, dermatomyositis, psoriatic arthritis, lupus, viral transient arthralgia, progression of osteoarthritis.  I am suspicious of rheumatological origin of the symptoms and would prefer that she followed up with rheumatology where she is already established care.  She appears already be on an appropriate steroid regimen and has a muscle relaxer to help with symptomatic relief.  She was encouraged to reach out to her previous rheumatologist to see if she could schedule appointment and to let us know if she had any issues getting in with a rheumatologist.  I be happy to place referral to rheumatology if she encounters any barriers. -Follow-up CBC, BMP, HIV -Continue Flexeril -Continue steroid regimen -Follow-up with rheumatology

## 2020-09-10 NOTE — Progress Notes (Signed)
    SUBJECTIVE:   CHIEF COMPLAINT / HPI:   Polyarthralgia Rachel Mckinney has been suffering from multiple new onset issues for about the past week.  Her concerns include, joint aches all over her body and specifically her left arm and right ankle.  She has been having significant difficulty sleeping because of the aches across her shoulders.  She is also noted significant fatigue and occasional bruising over her arms.  She is concerned she may be losing weight due to fatigue and decreased appetite.  She was recently seen at urgent care on 9/21 where she was told she has polyarthralgia and she was given a steroid taper and a muscle relaxer to help her sleep.  The muscle relaxer has been helping her sleep at night.  She has noted significant improvement in her symptoms since she started taking the steroids but she has not yet fully returned to normal.  She is concerned about all of these sudden onset issues and wants to know what could be going on.  She does have a distant history of an autoimmune issue and is ANA positive.  She is previously seen rheumatology 1 time in 2019.  She is not sure what her diagnosis and is not positive why she was treated.  Ultimately, she was under the impression that her if she was cured and she did not need further treatment from rheumatology.  On chart review, it appears that she was intended to have follow-up with rheumatology after about 3 months.  PERTINENT  PMH / PSH: Hidradenitis suppurativa  OBJECTIVE:   BP (!) 138/98   Pulse 81   Ht 5\' 5"  (1.651 m)   Wt 221 lb 3.2 oz (100.3 kg)   SpO2 98%   BMI 36.81 kg/m    General: Well-appearing middle-aged woman seated comfortably in the exam table in no acute distress.  Some discomfort and aching with movement and removing her jacket. HEENT: Moist mucous membranes. Cardiac: Regular rate and rhythm.  No M/R/G Respiratory: Breathing comfortably on room air, no respiratory distress.  Clear to auscultation  bilaterally MSK: Nonspecific joint tenderness over left shoulder, left scapula, right scapula, left elbow, right ankle. Skin: Well-healing bruising noted over right bicep.  Warm, dry  ASSESSMENT/PLAN:   Polyarthralgia There are multiple systemic complaints are certainly concerning for logical issue.  She is not certain of her previous diagnoses or previous work-up with rheumatology.  The differential at this point includes polymyalgia rheumatica, dermatomyositis, psoriatic arthritis, lupus, viral transient arthralgia, progression of osteoarthritis.  I am suspicious of rheumatological origin of the symptoms and would prefer that she followed up with rheumatology where she is already established care.  She appears already be on an appropriate steroid regimen and has a muscle relaxer to help with symptomatic relief.  She was encouraged to reach out to her previous rheumatologist to see if she could schedule appointment and to let us know if she had any issues getting in with a rheumatologist.  I be happy to place referral to rheumatology if she encounters any barriers. -Follow-up CBC, BMP, HIV -Continue Flexeril -Continue steroid regimen -Follow-up with rheumatology     Rachel Haymaker, MD [place]

## 2020-09-10 NOTE — Patient Instructions (Signed)
Polyarthralgia: I am sure that you are having so much discomfort from your joint pain. I do think that this will improve on steroids but I think that you need to speak with your rheumatologist again about the best long-term treatment. Here is the contact information for the rheumatologist you saw in 2019.  Wendall Mola, MD  [address] [place], [place] [address]  [phone]  [phone] (Fax)   I recommend that you call and try to schedule an appointment as soon as possible. This is not an emergency but it would be helpful if they could see you before you finish your steroid course. If you need a new referral, I would be happy to put that in but I do not think it will be necessary for you to wait for that.  We will get some basic labs today and I will let you know if there is anything concerning. If everything is normal, I will send a message.

## 2020-09-11 LAB — BASIC METABOLIC PANEL
BUN/Creatinine Ratio: 17 (ref 9–23)
BUN: 14 mg/dL (ref 6–24)
CO2: 21 mmol/L (ref 20–29)
Calcium: 9.5 mg/dL (ref 8.7–10.2)
Chloride: 101 mmol/L (ref 96–106)
Creatinine, Ser: 0.84 mg/dL (ref 0.57–1.00)
GFR calc Af Amer: 97 mL/min/{1.73_m2} (ref >59)
GFR calc non Af Amer: 84 mL/min/{1.73_m2} (ref >59)
Glucose: 120 mg/dL — ABNORMAL HIGH (ref 65–99)
Potassium: 5 mmol/L (ref 3.5–5.2)
Sodium: 136 mmol/L (ref 134–144)

## 2020-09-11 LAB — TSH: TSH: 0.796 u[IU]/mL (ref 0.450–4.500)

## 2020-09-11 LAB — CBC
Hematocrit: 34.8 % (ref 34.0–46.6)
Hemoglobin: 11.9 g/dL (ref 11.1–15.9)
MCH: 32.4 pg (ref 26.6–33.0)
MCHC: 34.2 g/dL (ref 31.5–35.7)
MCV: 95 fL (ref 79–97)
Platelets: 505 10*3/uL — ABNORMAL HIGH (ref 150–450)
RBC: 3.67 x10E6/uL — ABNORMAL LOW (ref 3.77–5.28)
RDW: 12.2 % (ref 11.7–15.4)
WBC: 15.7 10*3/uL — ABNORMAL HIGH (ref 3.4–10.8)

## 2020-09-11 LAB — HIV ANTIBODY (ROUTINE TESTING W REFLEX): HIV Screen 4th Generation wRfx: NONREACTIVE

## 2020-09-19 ENCOUNTER — Other Ambulatory Visit: Payer: Self-pay

## 2020-09-19 ENCOUNTER — Emergency Department (HOSPITAL_COMMUNITY)
Admission: EM | Admit: 2020-09-19 | Discharge: 2020-09-20 | Disposition: A | Discharge status: Home / Self Care | Payer: Medicaid Other | Attending: Emergency Medicine | Admitting: Emergency Medicine

## 2020-09-19 ENCOUNTER — Encounter (HOSPITAL_COMMUNITY): Payer: Self-pay | Admitting: Emergency Medicine

## 2020-09-19 DIAGNOSIS — I10 Essential (primary) hypertension: Secondary | ICD-10-CM | POA: Insufficient documentation

## 2020-09-19 DIAGNOSIS — M130 Polyarthritis, unspecified: Secondary | ICD-10-CM | POA: Insufficient documentation

## 2020-09-19 DIAGNOSIS — R2232 Localized swelling, mass and lump, left upper limb: Secondary | ICD-10-CM | POA: Insufficient documentation

## 2020-09-19 DIAGNOSIS — F17210 Nicotine dependence, cigarettes, uncomplicated: Secondary | ICD-10-CM | POA: Insufficient documentation

## 2020-09-19 DIAGNOSIS — R630 Anorexia: Secondary | ICD-10-CM | POA: Insufficient documentation

## 2020-09-19 DIAGNOSIS — R531 Weakness: Secondary | ICD-10-CM

## 2020-09-19 DIAGNOSIS — M2550 Pain in unspecified joint: Secondary | ICD-10-CM

## 2020-09-19 LAB — I-STAT BETA HCG BLOOD, ED (MC, WL, AP ONLY): I-stat hCG, quantitative: 13.2 m[IU]/mL — ABNORMAL HIGH (ref <5)

## 2020-09-19 MED ORDER — METHYLPREDNISOLONE SODIUM SUCC 125 MG IJ SOLR
125.0000 mg | Freq: Once | INTRAMUSCULAR | Status: AC
  Administered 2020-09-20: 125 mg via INTRAVENOUS
  Filled 2020-09-19: qty 2

## 2020-09-19 MED ORDER — PREDNISONE 10 MG (21) PO TBPK: ORAL_TABLET | Freq: Every day | ORAL | 0 refills | Status: DC

## 2020-09-19 MED ORDER — FENTANYL CITRATE (PF) 100 MCG/2ML IJ SOLN
50.0000 ug | Freq: Once | INTRAMUSCULAR | Status: AC
  Administered 2020-09-19: 50 ug via INTRAVENOUS
  Filled 2020-09-19: qty 2

## 2020-09-19 MED ORDER — HYDROCODONE-ACETAMINOPHEN 5-325 MG PO TABS: 1.0000 | ORAL_TABLET | Freq: Four times a day (QID) | ORAL | 0 refills | Status: DC | PRN

## 2020-09-19 NOTE — ED Provider Notes (Signed)
[facility] DEPT Provider Note   CSN: [id number] Arrival date & time: 09/19/20  1909     History Chief Complaint  Patient presents with  . Foot Pain  . Neck Pain    Rachel Mckinney is a 44 y.o. female presenting for evaluation of ankle and foot pain, generalized weakness, decreased appetite.  Patient states this has been going on for several weeks.  She was initially seen in urgent care, diagnosed with likely arthritis.  She then followed with her primary care doctor, there is concern for rheumatologic condition.  She has an appoint with rheumatology, but not for several months.  She states she was put on prednisone and muscle relaxer, initially felt like it helped but is no longer on this and continues to have severe pain.  She states she is taking a lot of anti-inflammatories with short-lived relief.  She denies fall, trauma, or injury.  She denies fevers, chills, cough, chest pain, nausea vomiting abdominal pain or urinary symptoms, normal bowel movements.  In the past several months, she has been sexually active with 2 female partners.  They do not always use condoms.  She denies vaginal discharge.  She reports she has been positive for gonorrhea in the past, but was treated appropriately at the time.  Additional history obtained from chart review.  Reviewed urgent care and family practice visit in the past 4 weeks. I reviewed pts labs from last week.    HPI     Past Medical History:  Diagnosis Date  . Boils    under breast and inner thigh  . Hidradenitis suppurativa    Chronic problem for patient, multiple admissions for surgical drainage  . Hypertension     Patient Active Problem List   Diagnosis Date Noted  . Polyarthralgia 09/10/2020  . Vaginal discharge 10/26/2016  . Possible exposure to STD 05/24/2016  . Trichomonal vaginitis 05/24/2016  . Abnormal uterine bleeding 03/02/2016  . Intertriginous candidiasis 01/26/2016  . Headache  01/26/2016  . Tobacco abuse 05/12/2015  . HTN (hypertension) 05/12/2015  . Menorrhagia 05/12/2015  . Preventative health care 09/03/2012  . Hidradenitis suppurativa-chronic and involving multiple sites 02/10/2012  . Morbid obesity ([place]) 11/16/2011    Past Surgical History:  Procedure Laterality Date  . CESAREAN SECTION    . multiple abscess drainages       OB History    Gravida  3   Para      Term      Preterm      AB  2   Living  1     SAB      TAB      Ectopic      Multiple      Live Births  1           Family History  Problem Relation Age of Onset  . Liver disease Father   . Hypertension Father   . Hypertension Sister     Social History   Tobacco Use  . Smoking status: Current Every Day Smoker    Packs/day: 0.10    Types: Cigarettes  . Smokeless tobacco: Never Used  Substance Use Topics  . Alcohol use: Yes    Comment: on weekends  . Drug use: Yes    Types: Marijuana    Comment: occas use of marijuana    Home Medications Prior to Admission medications   Medication Sig Start Date End Date Taking? Authorizing Provider  HYDROcodone-acetaminophen (NORCO/VICODIN) 5-325 MG tablet Take  1 tablet by mouth every 6 (six) hours as needed for severe pain. 09/19/20   , , PA-C  ibuprofen (ADVIL) 600 MG tablet Take 1 tablet (600 mg total) by mouth every 6 (six) hours as needed. 01/08/20   Rosemarie Ax, MD  inFLIXimab in sodium chloride 0.9 % Inject into the vein.    [provider]  megestrol (MEGACE) 40 MG tablet Take 1 tablet (40 mg total) by mouth 2 (two) times daily. Can increase to two tablets twice a day in the event of heavy bleeding 08/08/19   Clovia Cuff C, MD  naproxen (NAPROSYN) 500 MG tablet Take 1 tablet (500 mg total) by mouth 2 (two) times daily. Patient not taking: Reported on 07/26/2019 04/12/19   Orvan July, NP  OVER THE COUNTER MEDICATION Apply 1 application topically daily. Magnesium sulfate cream    [provider]  predniSONE (STERAPRED UNI-PAK 21 TAB) 10 MG (21) TBPK tablet Take by mouth daily. 6 tabs po qd x 2 days, 5 tabs x2 days, 4 tabs x2 days, 3 tabs x2 days, 2 tabs x2 days, 1 tab by mouth daily for 2 days 09/19/20   , , PA-C  spironolactone (ALDACTONE) 100 MG tablet Take 50 mg by mouth daily.     [provider]    Allergies    Patient has no known allergies.  Review of Systems   Review of Systems  Constitutional: Positive for appetite change and fatigue.  Musculoskeletal: Positive for joint swelling and myalgias.  All other systems reviewed and are negative.   Physical Exam Updated Vital Signs BP (!) 160/87 (BP Location: Right Arm)   Pulse 88   Temp 98.5 F (36.9 C)   Resp 16   Ht 5' 5" (1.651 m)   Wt 100.2 kg   SpO2 100%   BMI 36.78 kg/m   Physical Exam Vitals and nursing note reviewed. Exam conducted with a chaperone present.  Constitutional:      General: She is not in acute distress.    Appearance: She is well-developed. She is obese.     Comments: Appears nontoxic  HENT:     Head: Normocephalic and atraumatic.  Eyes:     Extraocular Movements: Extraocular movements intact.     Conjunctiva/sclera: Conjunctivae normal.     Pupils: Pupils are equal, round, and reactive to light.  Cardiovascular:     Rate and Rhythm: Normal rate and regular rhythm.     Pulses: Normal pulses.  Pulmonary:     Effort: Pulmonary effort is normal. No respiratory distress.     Breath sounds: Normal breath sounds. No wheezing.  Abdominal:     General: There is no distension.     Palpations: Abdomen is soft. There is no mass.     Tenderness: There is no abdominal tenderness. There is no guarding or rebound.  Genitourinary:    Comments: No discharge. minimal old blood in vaginal canal. IUD strings in place.  Musculoskeletal:        General: Swelling and tenderness present.     Cervical back: Normal range of motion and neck supple.     Comments:  Swelling and mild erythema of the left elbow, limited range of motion due to swelling.  Radial pulses 2+ bilaterally. Erythema, warmth, and swelling of the R foot. Pedal pulses 2+ bilaterally.  No erythema, warmth or swelling of any other joint noted.  Skin:    General: Skin is warm and dry.  Capillary Refill: Capillary refill takes less than 2 seconds.  Neurological:     Mental Status: She is alert and oriented to person, place, and time.     ED Results / Procedures / Treatments   Labs (all labs ordered are listed, but only abnormal results are displayed) Labs Reviewed  I-STAT BETA HCG BLOOD, ED ([facility], [facility], [facility] ONLY) - Abnormal; Notable for the following components:      Result Value   I-stat hCG, quantitative 13.2 (*)    All other components within normal limits  RPR  SEDIMENTATION RATE  C-REACTIVE PROTEIN  GC/CHLAMYDIA PROBE AMP ([place]) NOT AT [facility]    EKG None  Radiology No results found.  Procedures Procedures (including critical care time)  Medications Ordered in ED Medications  methylPREDNISolone sodium succinate (SOLU-MEDROL) 125 mg/2 mL injection 125 mg (has no administration in time range)  fentaNYL (SUBLIMAZE) injection 50 mcg (50 mcg Intravenous Given 09/19/20 2327)    ED Course  I have reviewed the triage vital signs and the nursing notes.  Pertinent labs & imaging results that were available during my care of the patient were reviewed by me and considered in my medical decision making (see chart for details).    MDM Rules/Calculators/A&P                          Patient presenting for evaluation of polyarthralgia, denies fatigue, appetite change.  On exam, patient appears nontoxic.  She does have swelling, warmth, erythema of her left elbow and her right foot.  No history of trauma, generally x-rays to be beneficial.  As she has multiple joints involved, and no risk fractures or septic joint, low suspicion for this.  Patient is afebrile and  well-appearing, this has been going on for several weeks, as such doubt septic joint.  Consider disseminated gonococcal disease as patient has had multiple partners without using condoms. Will perform pelvic.  We will add on labs including ESR, CRP, RPR.  Fentanyl for pain.  Pelvic showed minimal old blood in vaginal canal.  No discharge.  Exam is not consistent with gonorrhea or chlamydia.  As such, will not admit pt while results are pending. Will have pt return to ER if gonorrhea is positive.  Patient also reports at this point she was receiving Remicade infusions for Crohn's, stopped several months ago due to cost.  Discussed with patient my concern for likely autoimmune/rheumatologic cause for her symptoms.  Will give another course of prednisone.  Short course of pain control as needed.  Encourage patient to follow-up with her doctor for possible Remicade infusion, as well as following up with her doctor for rheumatologic referral.  At this time, patient appears safe for discharge.  Return precautions given.  Patient states she understands and agrees plan.  Final Clinical Impression(s) / ED Diagnoses Final diagnoses:  Polyarthralgia  General weakness  Decreased appetite    Rx / DC Orders ED Discharge Orders         Ordered    HYDROcodone-acetaminophen (NORCO/VICODIN) 5-325 MG tablet  Every 6 hours PRN        09/19/20 2343    predniSONE (STERAPRED UNI-PAK 21 TAB) 10 MG (21) TBPK tablet  Daily        09/19/20 2343           Franchot Heidelberg, PA-C 09/20/20 0001    Lacretia Leigh, MD 09/20/20 1540

## 2020-09-19 NOTE — ED Triage Notes (Signed)
Pt reports pain and swelling in her R foot for a couple weeks. Also reports pain in her neck, L arm and shoulders. She states that it is difficult to do her ADLs. States that she went to [facility] and was told it was arthritis. She has a follow up with Rheumatology, but not until Feb. Also reports that she is feeling tired and not eating right.  A&Ox4. Ambulatory with a crutch.

## 2020-09-19 NOTE — Discharge Instructions (Addendum)
Your tests are pending.  If your gonorrhea test is positive, return to the ER for further evaluation. Follow-up with your primary care doctor regarding the rest of the testing results from today. Call your [facility] doctor to discuss another dose of your Remicade infusion, as this may help your symptoms. Call family medicine to discuss referral for earlier rheumatology appointment.  Take prednisone as prescribed. If you are taking medicine such as ibuprofen, Aleve, BC powders while using prednisone, make sure you are taking an antacid as this can cause stomach upset. Use Tylenol as needed for further pain control. Use Norco as needed for severe breakthrough pain.  Have caution, this may make you tired or groggy.  Do not drive or operate heavy machinery while taking this medicine. Return to the emergency room with any new, worsening, concerning symptoms

## 2020-09-20 LAB — SEDIMENTATION RATE: Sed Rate: 135 mm/hr — ABNORMAL HIGH (ref 0–22)

## 2020-09-20 LAB — RPR: RPR Ser Ql: NONREACTIVE

## 2020-09-20 LAB — C-REACTIVE PROTEIN: CRP: 21.5 mg/dL — ABNORMAL HIGH (ref <1.0)

## 2020-09-20 MED ORDER — HYDROCODONE-ACETAMINOPHEN 5-325 MG PO TABS
1.0000 | ORAL_TABLET | Freq: Once | ORAL | Status: AC
  Administered 2020-09-20: 1 via ORAL
  Filled 2020-09-20: qty 1

## 2020-09-21 LAB — GC/CHLAMYDIA PROBE AMP (~~LOC~~) NOT AT ARMC
Chlamydia: NEGATIVE
Comment: NEGATIVE
Comment: NORMAL
Neisseria Gonorrhea: NEGATIVE

## 2020-10-07 ENCOUNTER — Ambulatory Visit
Admission: EM | Admit: 2020-10-07 | Discharge: 2020-10-07 | Disposition: A | Discharge status: Home / Self Care | Payer: Self-pay | Attending: Family Medicine | Admitting: Family Medicine

## 2020-10-07 DIAGNOSIS — M1990 Unspecified osteoarthritis, unspecified site: Secondary | ICD-10-CM

## 2020-10-07 HISTORY — DX: Pain in unspecified joint: M25.50

## 2020-10-07 MED ORDER — NAPROXEN 500 MG PO TABS: 500.0000 mg | ORAL_TABLET | Freq: Two times a day (BID) | ORAL | 0 refills | Status: DC

## 2020-10-07 NOTE — ED Triage Notes (Signed)
Pt states been dx with polyarthritis from PCP a few months ago. Pt c/o lt arm pain with swelling to lt hand and rt foot pain x3 days. States unable to see PCP till December, states needing something for pain. States unable to take the vicodin that was given. States the only thing helps her is naproxen 500mg . Her PCP told her to get it over the counter but states it doesn't work as well.

## 2020-10-07 NOTE — Discharge Instructions (Addendum)
Follow-up with PCP. Ask her if she is willing to initiate long-term therapy with Plaquenil or methotrexate prior to seeing rheumatologist

## 2020-10-07 NOTE — ED Provider Notes (Signed)
[facility]    CSN: [id number] Arrival date & time: 10/07/20  1339      History   Chief Complaint Chief Complaint  Patient presents with  . Arm Pain    HPI Rachel Mckinney is a 44 y.o. female.  Patient presents with chief complaint of pain in her left arm. 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 Lab work shows that she does probably have some lupus if not some other arthritis. She has elevated sed rate and C-reactive protein as well as positive ANA. She does not want to take opiates as they make her sleepy and make it difficult for her to perform her duties at work. She does have appointment with rheumatologist but not till February. She has a PCP at [facility] who follows her as well.  HPI  Past Medical History:  Diagnosis Date  . Boils    under breast and inner thigh  . Hidradenitis suppurativa    Chronic problem for patient, multiple admissions for surgical drainage  . Hypertension   . Polyarthralgia     Patient Active Problem List   Diagnosis Date Noted  . Polyarthralgia 09/10/2020  . Vaginal discharge 10/26/2016  . Possible exposure to STD 05/24/2016  . Trichomonal vaginitis 05/24/2016  . Abnormal uterine bleeding 03/02/2016  . Intertriginous candidiasis 01/26/2016  . Headache 01/26/2016  . Tobacco abuse 05/12/2015  . HTN (hypertension) 05/12/2015  . Menorrhagia 05/12/2015  . Preventative health care 09/03/2012  . Hidradenitis suppurativa-chronic and involving multiple sites 02/10/2012  . Morbid obesity ([place]) 11/16/2011    Past Surgical History:  Procedure Laterality Date  . CESAREAN SECTION    . multiple abscess drainages      OB History    Gravida  3   Para      Term      Preterm      AB  2   Living  1     SAB      TAB      Ectopic      Multiple      Live Births  1            Home Medications    Prior to Admission medications   Not on File     Family History Family History  Problem Relation Age of Onset  . Liver disease Father   . Hypertension Father   . Hypertension Sister     Social History Social History   Tobacco Use  . Smoking status: Current Every Day Smoker    Packs/day: 0.10    Types: Cigarettes  . Smokeless tobacco: Never Used  Substance Use Topics  . Alcohol use: Yes    Comment: on weekends  . Drug use: Not Currently     Allergies   Patient has no known allergies.   Review of Systems Review of Systems  Constitutional: Positive for activity change.  Musculoskeletal: Positive for arthralgias.  All other systems reviewed and are negative.    Physical Exam Triage Vital Signs ED Triage Vitals  Enc Vitals Group     BP 10/07/20 1351 (!) 180/91     Pulse Rate 10/07/20 1351 94     Resp 10/07/20 1351 18     Temp 10/07/20 1351 97.9 F (36.6 C)     Temp Source 10/07/20 1351 Oral     SpO2 10/07/20 1351 98 %     Weight --  Height --      Head Circumference --      Peak Flow --      Pain Score 10/07/20 1352 10     Pain Loc --      Pain Edu? --      Excl. in [place]? --    No data found.  Updated Vital Signs BP (!) 180/91 (BP Location: Left Arm)   Pulse 94   Temp 97.9 F (36.6 C) (Oral)   Resp 18   SpO2 98%   Visual Acuity Right Eye Distance:   Left Eye Distance:   Bilateral Distance:    Right Eye Near:   Left Eye Near:    Bilateral Near:     Physical Exam Vitals and nursing note reviewed.  Constitutional:      Appearance: Normal appearance. She is obese.  HENT:     Head: Normocephalic.  Cardiovascular:     Rate and Rhythm: Normal rate and regular rhythm.  Pulmonary:     Effort: Pulmonary effort is normal.     Breath sounds: Normal breath sounds.  Musculoskeletal:     Comments: Patient has swelling in the left hand and wrist and right ankle currently  Neurological:     General: No focal deficit present.     Mental Status: She is alert and oriented to person, place, and  time.      UC Treatments / Results  Labs (all labs ordered are listed, but only abnormal results are displayed) Labs Reviewed - No data to display  EKG   Radiology No results found.  Procedures Procedures (including critical care time)  Medications Ordered in UC Medications - No data to display  Initial Impression / Assessment and Plan / UC Course  I have reviewed the triage vital signs and the nursing notes.  Pertinent labs & imaging results that were available during my care of the patient were reviewed by me and considered in my medical decision making (see chart for details).     Polyarthritis, probably secondary to lupus versus some other inflammatory condition. I think she would benefit from some chronic therapy such as Plaquenil or methotrexate but I am reluctant to initiate therapy in this setting. I would hope her PCP might consider this given barriers to see rheumatologist Final Clinical Impressions(s) / UC Diagnoses   Final diagnoses:  None   Discharge Instructions   None    ED Prescriptions    None     PDMP not reviewed this encounter.   Wardell Honour, MD 10/07/20 [phone]

## 2020-10-13 ENCOUNTER — Other Ambulatory Visit: Payer: Self-pay

## 2020-10-13 ENCOUNTER — Emergency Department (HOSPITAL_COMMUNITY)
Admission: EM | Admit: 2020-10-13 | Discharge: 2020-10-13 | Disposition: A | Discharge status: Home / Self Care | Payer: Medicaid Other | Attending: Emergency Medicine | Admitting: Emergency Medicine

## 2020-10-13 ENCOUNTER — Encounter (HOSPITAL_COMMUNITY): Payer: Self-pay

## 2020-10-13 DIAGNOSIS — F17210 Nicotine dependence, cigarettes, uncomplicated: Secondary | ICD-10-CM | POA: Insufficient documentation

## 2020-10-13 DIAGNOSIS — I10 Essential (primary) hypertension: Secondary | ICD-10-CM | POA: Insufficient documentation

## 2020-10-13 DIAGNOSIS — M79671 Pain in right foot: Secondary | ICD-10-CM | POA: Insufficient documentation

## 2020-10-13 DIAGNOSIS — M79642 Pain in left hand: Secondary | ICD-10-CM | POA: Insufficient documentation

## 2020-10-13 LAB — BASIC METABOLIC PANEL
Anion gap: 11 (ref 5–15)
BUN: 11 mg/dL (ref 6–20)
CO2: 23 mmol/L (ref 22–32)
Calcium: 8.9 mg/dL (ref 8.9–10.3)
Chloride: 108 mmol/L (ref 98–111)
Creatinine, Ser: 0.79 mg/dL (ref 0.44–1.00)
GFR, Estimated: >60 mL/min (ref >60)
Glucose, Bld: 101 mg/dL — ABNORMAL HIGH (ref 70–99)
Potassium: 3.5 mmol/L (ref 3.5–5.1)
Sodium: 142 mmol/L (ref 135–145)

## 2020-10-13 MED ORDER — NAPROXEN 500 MG PO TABS: 500.0000 mg | ORAL_TABLET | Freq: Two times a day (BID) | ORAL | 0 refills | Status: DC

## 2020-10-13 MED ORDER — METHYLPREDNISOLONE SODIUM SUCC 40 MG IJ SOLR
40.0000 mg | Freq: Once | INTRAMUSCULAR | Status: AC
  Administered 2020-10-13: 40 mg via INTRAVENOUS
  Filled 2020-10-13: qty 1

## 2020-10-13 MED ORDER — PREDNISONE 20 MG PO TABS: 20.0000 mg | ORAL_TABLET | Freq: Every day | ORAL | 0 refills | Status: AC

## 2020-10-13 MED ORDER — KETOROLAC TROMETHAMINE 30 MG/ML IJ SOLN
15.0000 mg | Freq: Once | INTRAMUSCULAR | Status: AC
  Administered 2020-10-13: 15 mg via INTRAVENOUS
  Filled 2020-10-13: qty 1

## 2020-10-13 MED ORDER — FENTANYL CITRATE (PF) 100 MCG/2ML IJ SOLN
50.0000 ug | Freq: Once | INTRAMUSCULAR | Status: AC
  Administered 2020-10-13: 50 ug via INTRAVENOUS
  Filled 2020-10-13: qty 2

## 2020-10-13 NOTE — ED Triage Notes (Addendum)
Patient reports that she has been diagnosed with polyarthralgia. Patient c/o swelling of the right foot, left hand, and both shoulders x 1 month. Patient states she has been Vicodin. Patient states she was taking Naproxyn which helped, but is currently out of it.  Patient also c/o abscess to the left inner thigh.

## 2020-10-13 NOTE — ED Provider Notes (Signed)
[facility] DEPT Provider Note   CSN: [id number] Arrival date & time: 10/13/20  1327     History Chief Complaint  Patient presents with  . Joint Swelling  . Abscess    Rachel Mckinney is a 44 y.o. female.  HPI   Patient with significant medical history of hidradentitis presents to the emergency department with chief complaint of left hand swelling and right foot swelling.  Patient states she has had the swelling for the last 3 weeks and has had constant pain since then.  She denies any recent traumas or injuries to the area.  She states she has been seen by multiple providers including urgent care, ED's, primary care and they all state that she has arthritis or possible autoimmune disease.  She states she is scheduled to go see rheumatologist in February but is in a lot of pain and cannot wait that long.  She states she has been placed on steroids, NSAIDs, narcotics which only temporary help with her pain.  Patient endorses loss of appetite, and general fatigued.  But she denies rash, fevers, weight loss, bone pain, night sweats, vaginal pain, discharge.  She denies IV drug use or any other autoimmune diseases.  Patient states this pain is no different from before she is here because she she needs some relief.  Patient denies headache, fever, chills, shortness of breath, chest pain, tongue pain, nausea, vomiting, diarrhea, worsening pedal edema.  Past Medical History:  Diagnosis Date  . Boils    under breast and inner thigh  . Hidradenitis suppurativa    Chronic problem for patient, multiple admissions for surgical drainage  . Hypertension   . Polyarthralgia     Patient Active Problem List   Diagnosis Date Noted  . Polyarthralgia 09/10/2020  . Vaginal discharge 10/26/2016  . Possible exposure to STD 05/24/2016  . Trichomonal vaginitis 05/24/2016  . Abnormal uterine bleeding 03/02/2016  . Intertriginous candidiasis 01/26/2016  . Headache 01/26/2016   . Tobacco abuse 05/12/2015  . HTN (hypertension) 05/12/2015  . Menorrhagia 05/12/2015  . Preventative health care 09/03/2012  . Hidradenitis suppurativa-chronic and involving multiple sites 02/10/2012  . Morbid obesity ([place]) 11/16/2011    Past Surgical History:  Procedure Laterality Date  . CESAREAN SECTION    . multiple abscess drainages       OB History    Gravida  3   Para      Term      Preterm      AB  2   Living  1     SAB      TAB      Ectopic      Multiple      Live Births  1           Family History  Problem Relation Age of Onset  . Liver disease Father   . Hypertension Father   . Hypertension Sister     Social History   Tobacco Use  . Smoking status: Current Every Day Smoker    Packs/day: 0.10    Types: Cigarettes  . Smokeless tobacco: Never Used  Vaping Use  . Vaping Use: Never used  Substance Use Topics  . Alcohol use: Yes    Comment: on weekends  . Drug use: Not Currently    Home Medications Prior to Admission medications   Medication Sig Start Date End Date Taking? Authorizing Provider  acetaminophen (TYLENOL) 500 MG tablet Take 500-1,000 mg by mouth every 6 (six)  hours as needed for mild pain.   Yes [provider]  cholecalciferol (VITAMIN D3) 25 MCG (1000 UNIT) tablet Take 1,000 Units by mouth daily.   Yes [provider]  Turmeric (QC TUMERIC COMPLEX) 500 MG CAPS Take by mouth.   Yes [provider]  zinc gluconate 50 MG tablet Take 50 mg by mouth daily.   Yes [provider]  naproxen (NAPROSYN) 500 MG tablet Take 1 tablet (500 mg total) by mouth 2 (two) times daily. Patient not taking: Reported on 10/13/2020 10/07/20   Wardell Honour, MD  naproxen (NAPROSYN) 500 MG tablet Take 1 tablet (500 mg total) by mouth 2 (two) times daily for 10 days. 10/13/20 10/23/20  Marcello Fennel, PA-C  predniSONE (DELTASONE) 20 MG tablet Take 1 tablet (20 mg total) by mouth daily for 5 days.  10/13/20 10/18/20  Marcello Fennel, PA-C    Allergies    Patient has no known allergies.  Review of Systems   Review of Systems  Constitutional: Positive for appetite change and fatigue. Negative for chills and fever.  HENT: Negative for congestion, tinnitus, trouble swallowing and voice change.   Eyes: Negative for visual disturbance.  Respiratory: Negative for shortness of breath.   Cardiovascular: Negative for chest pain.  Gastrointestinal: Negative for abdominal pain, diarrhea, nausea and vomiting.  Genitourinary: Negative for dysuria, enuresis and flank pain.  Musculoskeletal: Negative for back pain.       Patient Dors his left hand pain and right foot pain.  Skin: Negative for rash.  Neurological: Negative for dizziness and headaches.  Hematological: Does not bruise/bleed easily.    Physical Exam Updated Vital Signs BP (!) 171/89   Pulse 80   Temp 98.2 F (36.8 C) (Oral)   Resp 17   Ht 5\' 5"  (1.651 m)   Wt 100.2 kg   SpO2 100%   BMI 36.78 kg/m   Physical Exam Vitals and nursing note reviewed.  Constitutional:      General: She is not in acute distress.    Appearance: She is not ill-appearing.  HENT:     Head: Normocephalic and atraumatic.     Nose: No congestion.     Mouth/Throat:     Mouth: Mucous membranes are moist.     Pharynx: Oropharynx is clear.  Eyes:     General: No scleral icterus. Cardiovascular:     Rate and Rhythm: Normal rate and regular rhythm.     Pulses: Normal pulses.     Heart sounds: No murmur heard.  No friction rub. No gallop.   Pulmonary:     Effort: No respiratory distress.     Breath sounds: No wheezing, rhonchi or rales.  Abdominal:     General: There is no distension.     Tenderness: There is no abdominal tenderness. There is no right CVA tenderness, left CVA tenderness or guarding.  Musculoskeletal:        General: Swelling and tenderness present.     Right lower leg: Edema present.     Comments: Patient's upper  extremities were visualized, left hand was edematous, nonerythematous, she had full range of motion at the fingers, wrist and elbow.  She admits to pain with movement and is tender to palpation.  Area was soft to the touch neurovascular fully intact.  Lower extremity was visualized right foot was visualized it was edematous, nonerythematous, she had full range of motion at the toes ankles and knee.  She admits to pain with movement  and tender to palpation.  Area was soft to the touch neurovascular fully intact.  Skin:    General: Skin is warm and dry.     Coloration: Skin is not jaundiced or pale.     Findings: No lesion or rash.     Comments: Skin exam was performed no rashes, lesions, ecchymosis, laceration or abrasions noted.  All joints were visualized they were nonswollen not erythematous, not warm to the touch.  Neurological:     Mental Status: She is alert.  Psychiatric:        Mood and Affect: Mood normal.     ED Results / Procedures / Treatments   Labs (all labs ordered are listed, but only abnormal results are displayed) Labs Reviewed  BASIC METABOLIC PANEL - Abnormal; Notable for the following components:      Result Value   Glucose, Bld 101 (*)    All other components within normal limits    EKG None  Radiology No results found.  Procedures Procedures (including critical care time)  Medications Ordered in ED Medications  ketorolac (TORADOL) 30 MG/ML injection 15 mg (has no administration in time range)  methylPREDNISolone sodium succinate (SOLU-MEDROL) 40 mg/mL injection 40 mg (has no administration in time range)  fentaNYL (SUBLIMAZE) injection 50 mcg (50 mcg Intravenous Given 10/13/20 1522)    ED Course  I have reviewed the triage vital signs and the nursing notes.  Pertinent labs & imaging results that were available during my care of the patient were reviewed by me and considered in my medical decision making (see chart for details).    MDM  Rules/Calculators/A&P                          I have personally reviewed all imaging, labs and have interpreted them.  Patient presents with left hand and right foot swelling and pain.  She is alert, did not appear in acute distress, vital signs reassuring.  Will order BMP and provide fentanyl for pain.  BMP does not show any Electra abnormalities, no metabolic acidosis, hyperglycemia 101, no AKI, no anion gap noted.  I have low suspicion for septic arthritis as patient denies IV drug use, skin exam was performed no erythematous, edematous, warm joints noted on exam, no new heart murmur heard on exam.  Low suspicion for fracture or dislocation as patient denies recent injuries or trauma to the areas, there is no gross abnormalities noted on exam. low suspicion for ligament or tendon damage as area was palpated no gross defects noted, they had full range of motion.  Low suspicion for compartment syndrome as area was palpated it was soft to the touch, neurovascular fully intact.  I suspect patient suffering from an autoimmune disease possible rheumatoid arthritis versus lupus.  I will provide patient with NSAIDs, steroids and have her follow-up with rheumatology for further evaluation management.  Vital signs have remained stable, no indication for hospital admission.  Patient discussed with attending and they agreed with assessment and plan.  Patient given at home care as well strict return precautions.  Patient verbalized that they understood agreed to said plan.   Final Clinical Impression(s) / ED Diagnoses Final diagnoses:  Left hand pain  Right foot pain    Rx / DC Orders ED Discharge Orders         Ordered    naproxen (NAPROSYN) 500 MG tablet  2 times daily        10/13/20 1541  predniSONE (DELTASONE) 20 MG tablet  Daily        10/13/20 1631           Aron Baba 10/13/20 1634    Sherwood Gambler, MD 10/15/20 [phone]

## 2020-10-13 NOTE — Discharge Instructions (Addendum)
Seen here for left hand and right foot pain.  I have prescribed you naproxen please take as prescribed.  Also given you steroids please use as prescribed.  Also recommend taking Tylenol as this can help with the pain.  You can apply heat ice to the area and keep it elevated as this can help decrease inflammation and swelling.  It is imperative that you follow-up with rheumatologist for further evaluation.  I recommend if you cannot be seen earlier to call rounds he can find another earlier appointment.  Come back to the emergency department if you develop chest pain, shortness of breath, severe abdominal pain, uncontrolled nausea, vomiting, diarrhea.

## 2020-10-19 ENCOUNTER — Ambulatory Visit (INDEPENDENT_AMBULATORY_CARE_PROVIDER_SITE_OTHER): Payer: Self-pay | Admitting: Family Medicine

## 2020-10-19 ENCOUNTER — Other Ambulatory Visit: Payer: Self-pay

## 2020-10-19 ENCOUNTER — Encounter: Payer: Self-pay | Admitting: Family Medicine

## 2020-10-19 VITALS — BP 152/70 | HR 75 | Ht 65.0 in | Wt 220.8 lb

## 2020-10-19 DIAGNOSIS — M2550 Pain in unspecified joint: Secondary | ICD-10-CM

## 2020-10-19 DIAGNOSIS — R45851 Suicidal ideations: Secondary | ICD-10-CM

## 2020-10-19 DIAGNOSIS — I10 Essential (primary) hypertension: Secondary | ICD-10-CM

## 2020-10-19 MED ORDER — PANTOPRAZOLE SODIUM 40 MG PO TBEC: 40.0000 mg | DELAYED_RELEASE_TABLET | Freq: Every day | ORAL | 0 refills | Status: DC

## 2020-10-19 MED ORDER — PREDNISONE 10 MG PO TABS: ORAL_TABLET | ORAL | 0 refills | Status: AC

## 2020-10-19 MED ORDER — NAPROXEN 500 MG PO TABS: 500.0000 mg | ORAL_TABLET | Freq: Two times a day (BID) | ORAL | 1 refills | Status: DC

## 2020-10-19 NOTE — Assessment & Plan Note (Addendum)
Patient is hypertensive today to 15 2/70.  In the last few visits, she has been in the 170s/80s.  Plan to discuss her hypertension with her today, however given other issues, will defer until next visit.  Can also consider that her elevated blood pressures are related to her pain.

## 2020-10-19 NOTE — Assessment & Plan Note (Addendum)
Given physical exam findings and history, I am very concerned for rheumatologic issue.  Unfortunately, patient has not been able to get into rheumatology.  For now, will start a long prednisone taper with close follow-up to monitor labs and sugars.  Additionally, I have sent naproxen 500 mg twice daily.  Her last BMP was within normal limits on 10/13/2020.  I have also given patient pantoprazole given increased risk of adverse GI effects on both medications.  We will recheck her BMP at her visit next week.  I will plan to call rheumatology for further recommendations for this patient tomorrow during the business day.  Patient also encouraged to call rheumatology office is in the area to see when she can be seen the soonest.  We will send a referral to wherever she needs [place] to. - prednisone - naproxen  - pantoprazole - close lab follow up and in person follow up  - work note until cleared by physician - xrays of left upper extremity, shoulders and right foot

## 2020-10-19 NOTE — Assessment & Plan Note (Signed)
Patient denies any SI today.  She does not have any plan.  She reports that her symptoms are purely from feeling helpless from her current physical issues that are keeping her from doing typical daily tasks, which makes her feel more depressed.  I have provided her with resources for suicide hotline should she have repeat symptoms or ideation.  Patient is agreeable to calling the suicide hotline should she feel that she is a danger to herself.  Reviewed protective factors.  Also provided list of counselors to help cope with her illness and the stress it causes in multiple aspects of her life.  Overall, this seems to be an isolated event that has a very specific cause.  Nonetheless, given severity of situation, will follow up with patient in person next week to see how she is coping and to follow-up with her pain.  Patient also has MyChart and can message if she feels depressed or needs to be seen sooner.

## 2020-10-19 NOTE — Patient Instructions (Addendum)
-Please make sure you take the naproxen with meals.  If you have any dark tarry stools or blood in your stool, please let us know immediately.  We are also placing you on a medication called pantoprazole which can be protective for your GI tract as you are taking 2 medications that can increase your risk of ulcers. -Please come back for follow-up visit on 11/9 at 1:30 PM.  We will plan to get labs on that day as well. -Please go to [facility] imaging to obtain x-rays.  They have been ordered, you do not need any appointment -We are starting you on a prednisone taper.  Please take these medications daily and follow the tapering schedule.  We will check labs next week. -I will call rheumatology for recommendations for treatment.  Please make sure you are calling around to rheumatology offices to see when you can get in the soonest.  Once you find an office, please let us know immediately so that we can send the referral.   If you are feeling suicidal or depression symptoms worsen please immediately go to:   [facility]  [address] [place], [place] [facility] [phone]    . If you are thinking about harming yourself or having thoughts of suicide, or if you know someone who is, seek help right away. . Call your doctor or mental health care provider. . Call 911 or go to a hospital emergency room to get immediate help, or ask a friend or family member to help you do these things. . Call the [place] National Suicide Prevention Lifeline's toll-free, 24-hour hotline at 1-800-273-TALK (941) 711-9518) or TTY: 1-800-799-4 TTY 647-342-9898) to talk to a trained counselor. . If you are in crisis, make sure you are not left alone.  . If someone else is in crisis, make sure he or she is not left alone   [facility] [employer] (Domestic Violence, Rape & Victim Assistance [phone]  [facility] [place]     (ONLY from 8am-4pm)    [phone]  [facility] Mobile Crisis Unit (24/7)   [phone]  [place] National Suicide Hotline   445-215-4671 Diamantina Monks)    Therapy and Counseling Resources Most providers on this list will take Medicaid. Patients with commercial insurance or Medicare should contact their insurance company to get a list of in network providers.  [facility]:Psychiatry and Counseling [address] [facility] [place]. [place], [place] [address] [place]  [address] [place], [place] [address]      [place] [address]  [place], [place] [address] [phone]  [place] [address] [place]  [place],  [address]       [phone]      [facility] [address] [place], [place]  Family [facility]:  [place]. [place] [phone]  [facility]:  [place] [address] [place] [phone]  [facility] (under & uninsured) [address], [place] [phone]    [email or web address]    [place] [address] . [place]    [phone]  [facility] [place]     Phone:  [phone]     [facility]-  [address]  [phone]   [facility] [address] Marlou Porch      [place], [place]: [place], [place], [place]   [place] ([place] therapist) [email or web address]  [place] [address]   [place] [address]    [phone]    [facility]   [address],  [place], [place]   [place]  [place]  [facility]  [address] [place], [place]        [phone]  Open Access/Walk In Clinic under & uninsured  [facility]  [address] [place], [place] Crisis [phone]  [facility] the [place],  ([place])   [place], [place]: [phone]) 8:30 - 12; 1 - 2:30  [facility] of the [place],  [place], [place]    ([phone]):8:30 - 12; 2 - 3PM  [facility] [employer],  [address]  [place]; [phone]):   Mon - Fri 8 AM - 5 PM  [facility] [place]  MWF 12:30 to 3:00 or call to schedule an appointment  [phone]  Specific Provider options Psychology Today  [email or web address] 1. click on find a therapist  2. enter your zip code 3. left side and select or tailor a therapist for your specific need.   [facility] Provider Directory [email or web address]  (Medicaid)   Follow all drop down to find a provider  [place] [phone] or [email or web address] [address], [place], [place] Recovery support and educational   24- Hour Availability:  .  [facility]  . [place], [place] Crisis [phone]  . [facility] of the [employer] [phone]  [facility] Service  [phone]   . [place]  [phone] (after hours)  . [facility]/Mobile Crisis   [phone]  . [place] National Suicide Hotline  8481856461 ([place])  . Call 911 or go to emergency room  . [employer]  [phone]);  [place] and [employer]   . [facility] ACCESS  [phone]); [place], [place], [place], [place], [place], [place], [place]

## 2020-10-19 NOTE — Progress Notes (Signed)
SUBJECTIVE:  CHIEF COMPLAINT / HPI:   1. Polyarthralgia Patient has been seen multiple times in the last month regarding this pain.  She reports that her pain is mostly located in her left hands, forearm to elbow, bilateral shoulders, cervical spine and right foot.  She reports that there is severe swelling and warmth to touch.  She reports when she wakes up in the morning, she cannot move in her body feels very heavy.  She reports having difficulty walking in the morning.  Patient reports that she has been on multiple prednisone tapers, which she is not sure is helping her symptoms.  She does report that she did receive an injection at the ED which helped greatly improve her symptoms, though did not take them away.  She reports that naproxen helps a little, but Vicodin and other narcotic pain medications are not helpful to her as they limit her ability to function.  Currently, she takes naproxen daily, 4-6 tabs daily for about a month.  Her last labs on 10/13/2020 showed normal kidney function.  Patient reports that she has a follow-up with rheumatology (who she has seen in the past), but not until February. Patient reports associated symptoms such as eyelid rash and sometimes easy bruising.  She reports that her daughter was diagnosed with lupus and she has family history of autoimmune diseases.  Previously, her ESR is 135 and CRP 21.5.  Patient also notes that she has had this issue once last year.  She reports that her joint symptoms lasted for 3 to 4 weeks.  She does recall being diagnosed with a UTI at the time and remembering that her symptoms resolved with resolution of that UTI.  Patient also has history of severe HS and has had multiple surgeries for excisions and I&D's.  She does report that she previously lost her job due to surgical intervention and went through all of her FMLA.  She is scared that she is going to lose her job now.  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  2.  Elevated PHQ-9 with suicidal ideation On screening PHQ-9 today, patient's score is 20 with 3 marked on question #9.  Patient reports that she has never previously had any suicidal ideation or plans.  She reports the last time that she thought about suicide was last Tuesday when she felt helpless due to the pain that she has been experiencing and how it is affecting her life.  She does not have any SI or suicidal plans today.  She does not have any history of depression and reports that she is usually very happy.  She feels that she has a good life, good house and good support.  She reports that she has talked to her son about this, who is a protective factor for her.  Patient does report that she took multiple pain pills last week to help with the pain without the intention of trying to kill herself but to try to take the pain away.  PERTINENT  PMH / PSH: Daughter with SLE.  Previous positive ANA.  No other formal rheumatologic diagnoses.  Obesity, hypertension, severe hidradenitis suppurativa.  OBJECTIVE:  BP (!) 152/70    Pulse 75    Ht '5\' 5"'  (1.651 m)    Wt 220 lb 12.8 oz (100.2 kg)    SpO2 100%    BMI 36.74 kg/m   General: Nontoxic female, mild to moderate distress MSK: Obvious swelling of left upper extremity  compared to right.  (See picture) limited range of motion of left upper extremity including grip, supination pronation and extension, flexion of left elbow.  Patient has limited range of motion with left shoulder with forward flexion past 90 degrees.  She has tenderness to palpation of both shoulder girdles.  Her right foot is swollen at the dorsal surface and tender to palpation.  There is no erythema or rash.  ASSESSMENT/PLAN:  Suicidal ideation Patient denies any SI today.  She does not have any plan.  She reports that her symptoms are purely from feeling helpless from her current physical issues that are keeping her from doing typical daily tasks, which makes her feel  more depressed.  I have provided her with resources for suicide hotline should she have repeat symptoms or ideation.  Patient is agreeable to calling the suicide hotline should she feel that she is a danger to herself.  Reviewed protective factors.  Also provided list of counselors to help cope with her illness and the stress it causes in multiple aspects of her life.  Overall, this seems to be an isolated event that has a very specific cause.  Nonetheless, given severity of situation, will follow up with patient in person next week to see how she is coping and to follow-up with her pain.  Patient also has MyChart and can message if she feels depressed or needs to be seen sooner.  HTN (hypertension) Patient is hypertensive today to 15 2/70.  In the last few visits, she has been in the 170s/80s.  Plan to discuss her hypertension with her today, however given other issues, will defer until next visit.  Can also consider that her elevated blood pressures are related to her pain.  Polyarthralgia Given physical exam findings and history, I am very concerned for rheumatologic issue.  Unfortunately, patient has not been able to get into rheumatology.  For now, will start a long prednisone taper with close follow-up to monitor labs and sugars.  Additionally, I have sent naproxen 500 mg twice daily.  Her last BMP was within normal limits on 10/13/2020.  I have also given patient pantoprazole given increased risk of adverse GI effects on both medications.  We will recheck her BMP at her visit next week.  I will plan to call rheumatology for further recommendations for this patient tomorrow during the business day.  Patient also encouraged to call rheumatology office is in the area to see when she can be seen the soonest.  We will send a referral to wherever she needs [place] to. - prednisone - naproxen  - pantoprazole - close lab follow up and in person follow up  - work note until cleared by physician - xrays of left  upper extremity, shoulders and right foot      Wilber Oliphant, MD [place]

## 2020-10-23 ENCOUNTER — Telehealth: Payer: Self-pay | Admitting: Family Medicine

## 2020-10-23 NOTE — Telephone Encounter (Signed)
Patient reports that everything is okay for now. Inflammation is still present but improved from previously. Her left arm will still not straighten out. Foot and hand inflammation is still present but there has been improvement.  Patient reports that she will be able to start her job on Monday, which she is happy about.  Patient was able to get a hold of the rheumatologist at [facility] that she was previously seeing.  She reports that she can get back in with eye doctor on November 19.  Patient had directions to go to her dermatologist to get her back on her Remicaid prior to getting to Rheumatology at [facility] on November 19th if possible.  I will still plan to see patient on 11/9 and likely again the week afterwards to check in on labs given long prednisone taper as well as daily and naproxen use.  Wilber Oliphant, M.D.  2:51 PM 10/23/2020

## 2020-10-27 ENCOUNTER — Ambulatory Visit: Payer: Medicaid Other | Admitting: Family Medicine

## 2020-10-27 ENCOUNTER — Telehealth: Payer: Self-pay

## 2020-11-07 ENCOUNTER — Other Ambulatory Visit: Payer: Self-pay

## 2020-11-07 ENCOUNTER — Emergency Department (HOSPITAL_COMMUNITY)
Admission: EM | Admit: 2020-11-07 | Discharge: 2020-11-07 | Disposition: A | Discharge status: Home / Self Care | Payer: Self-pay | Attending: Emergency Medicine | Admitting: Emergency Medicine

## 2020-11-07 ENCOUNTER — Encounter (HOSPITAL_COMMUNITY): Payer: Self-pay | Admitting: Emergency Medicine

## 2020-11-07 DIAGNOSIS — M7989 Other specified soft tissue disorders: Secondary | ICD-10-CM | POA: Insufficient documentation

## 2020-11-07 DIAGNOSIS — Z5321 Procedure and treatment not carried out due to patient leaving prior to being seen by health care provider: Secondary | ICD-10-CM | POA: Insufficient documentation

## 2020-11-07 NOTE — ED Triage Notes (Signed)
Pt states she has been seen for her L arm swelling x 1 month and was sent in today by her PCP to expedite lab work and a possible MRI and/or venous study on that arm to r/o blood clot/gout/infected joint (see note from Dr. Graylin Shiver). Alert and oriented. Hand is visibly swollen.

## 2020-11-07 NOTE — ED Notes (Signed)
Pt gave labels to screener telling him she did not want to wait any longer.

## 2020-11-09 ENCOUNTER — Emergency Department (HOSPITAL_BASED_OUTPATIENT_CLINIC_OR_DEPARTMENT_OTHER): Payer: Self-pay

## 2020-11-09 ENCOUNTER — Emergency Department (HOSPITAL_COMMUNITY): Payer: Self-pay

## 2020-11-09 ENCOUNTER — Observation Stay (HOSPITAL_COMMUNITY)
Admission: EM | Admit: 2020-11-09 | Discharge: 2020-11-10 | Disposition: A | Discharge status: Home / Self Care | Payer: Self-pay | Attending: Emergency Medicine | Admitting: Emergency Medicine

## 2020-11-09 ENCOUNTER — Encounter (HOSPITAL_COMMUNITY): Payer: Self-pay | Admitting: Emergency Medicine

## 2020-11-09 DIAGNOSIS — D84821 Adverse effect of glucocorticoids and synthetic analogues, initial encounter: Secondary | ICD-10-CM | POA: Diagnosis present

## 2020-11-09 DIAGNOSIS — M25522 Pain in left elbow: Secondary | ICD-10-CM | POA: Insufficient documentation

## 2020-11-09 DIAGNOSIS — D6489 Other specified anemias: Secondary | ICD-10-CM | POA: Insufficient documentation

## 2020-11-09 DIAGNOSIS — R609 Edema, unspecified: Secondary | ICD-10-CM

## 2020-11-09 DIAGNOSIS — T380X5A Adverse effect of glucocorticoids and synthetic analogues, initial encounter: Secondary | ICD-10-CM | POA: Diagnosis present

## 2020-11-09 DIAGNOSIS — R768 Other specified abnormal immunological findings in serum: Secondary | ICD-10-CM | POA: Diagnosis present

## 2020-11-09 DIAGNOSIS — F17210 Nicotine dependence, cigarettes, uncomplicated: Secondary | ICD-10-CM | POA: Insufficient documentation

## 2020-11-09 DIAGNOSIS — M79632 Pain in left forearm: Principal | ICD-10-CM | POA: Insufficient documentation

## 2020-11-09 DIAGNOSIS — Z7952 Long term (current) use of systemic steroids: Secondary | ICD-10-CM | POA: Diagnosis present

## 2020-11-09 DIAGNOSIS — R9389 Abnormal findings on diagnostic imaging of other specified body structures: Secondary | ICD-10-CM | POA: Diagnosis present

## 2020-11-09 DIAGNOSIS — M79602 Pain in left arm: Secondary | ICD-10-CM

## 2020-11-09 DIAGNOSIS — M79609 Pain in unspecified limb: Secondary | ICD-10-CM

## 2020-11-09 DIAGNOSIS — R700 Elevated erythrocyte sedimentation rate: Secondary | ICD-10-CM | POA: Diagnosis present

## 2020-11-09 DIAGNOSIS — I10 Essential (primary) hypertension: Secondary | ICD-10-CM | POA: Insufficient documentation

## 2020-11-09 DIAGNOSIS — M2550 Pain in unspecified joint: Secondary | ICD-10-CM | POA: Diagnosis present

## 2020-11-09 DIAGNOSIS — M65832 Other synovitis and tenosynovitis, left forearm: Secondary | ICD-10-CM | POA: Diagnosis present

## 2020-11-09 DIAGNOSIS — M25571 Pain in right ankle and joints of right foot: Secondary | ICD-10-CM | POA: Diagnosis present

## 2020-11-09 DIAGNOSIS — M659 Synovitis and tenosynovitis, unspecified: Secondary | ICD-10-CM | POA: Diagnosis present

## 2020-11-09 DIAGNOSIS — L0390 Cellulitis, unspecified: Secondary | ICD-10-CM | POA: Insufficient documentation

## 2020-11-09 DIAGNOSIS — R591 Generalized enlarged lymph nodes: Secondary | ICD-10-CM | POA: Diagnosis present

## 2020-11-09 DIAGNOSIS — Z20822 Contact with and (suspected) exposure to covid-19: Secondary | ICD-10-CM | POA: Insufficient documentation

## 2020-11-09 DIAGNOSIS — M79671 Pain in right foot: Secondary | ICD-10-CM

## 2020-11-09 DIAGNOSIS — L732 Hidradenitis suppurativa: Secondary | ICD-10-CM | POA: Diagnosis present

## 2020-11-09 DIAGNOSIS — M25532 Pain in left wrist: Secondary | ICD-10-CM | POA: Insufficient documentation

## 2020-11-09 LAB — CBC WITH DIFFERENTIAL/PLATELET
Abs Immature Granulocytes: 0.06 10*3/uL (ref 0.00–0.07)
Basophils Absolute: 0 10*3/uL (ref 0.0–0.1)
Basophils Relative: 0 %
Eosinophils Absolute: 0.1 10*3/uL (ref 0.0–0.5)
Eosinophils Relative: 1 %
HCT: 29.9 % — ABNORMAL LOW (ref 36.0–46.0)
Hemoglobin: 9 g/dL — ABNORMAL LOW (ref 12.0–15.0)
Immature Granulocytes: 1 %
Lymphocytes Relative: 10 %
Lymphs Abs: 1.2 10*3/uL (ref 0.7–4.0)
MCH: 31 pg (ref 26.0–34.0)
MCHC: 30.1 g/dL (ref 30.0–36.0)
MCV: 103.1 fL — ABNORMAL HIGH (ref 80.0–100.0)
Monocytes Absolute: 0.9 10*3/uL (ref 0.1–1.0)
Monocytes Relative: 7 %
Neutro Abs: 10.4 10*3/uL — ABNORMAL HIGH (ref 1.7–7.7)
Neutrophils Relative %: 81 %
Platelets: 336 10*3/uL (ref 150–400)
RBC: 2.9 MIL/uL — ABNORMAL LOW (ref 3.87–5.11)
RDW: 15.6 % — ABNORMAL HIGH (ref 11.5–15.5)
WBC: 12.6 10*3/uL — ABNORMAL HIGH (ref 4.0–10.5)
nRBC: 0 % (ref 0.0–0.2)

## 2020-11-09 LAB — I-STAT BETA HCG BLOOD, ED (MC, WL, AP ONLY): I-stat hCG, quantitative: <5 m[IU]/mL (ref <5)

## 2020-11-09 LAB — C-REACTIVE PROTEIN: CRP: 19.8 mg/dL — ABNORMAL HIGH (ref <1.0)

## 2020-11-09 LAB — BASIC METABOLIC PANEL
Anion gap: 10 (ref 5–15)
BUN: 15 mg/dL (ref 6–20)
CO2: 21 mmol/L — ABNORMAL LOW (ref 22–32)
Calcium: 8.8 mg/dL — ABNORMAL LOW (ref 8.9–10.3)
Chloride: 107 mmol/L (ref 98–111)
Creatinine, Ser: 0.91 mg/dL (ref 0.44–1.00)
GFR, Estimated: >60 mL/min (ref >60)
Glucose, Bld: 92 mg/dL (ref 70–99)
Potassium: 3.9 mmol/L (ref 3.5–5.1)
Sodium: 138 mmol/L (ref 135–145)

## 2020-11-09 LAB — RESPIRATORY PANEL BY RT PCR (FLU A&B, COVID)
Influenza A by PCR: NEGATIVE
Influenza B by PCR: NEGATIVE
SARS Coronavirus 2 by RT PCR: NEGATIVE

## 2020-11-09 LAB — SEDIMENTATION RATE: Sed Rate: 112 mm/hr — ABNORMAL HIGH (ref 0–22)

## 2020-11-09 LAB — URIC ACID: Uric Acid, Serum: 5.1 mg/dL (ref 2.5–7.1)

## 2020-11-09 MED ORDER — LORAZEPAM 2 MG/ML IJ SOLN: 1.0000 mg | Freq: Once | INTRAMUSCULAR | Status: DC | PRN

## 2020-11-09 MED ORDER — VANCOMYCIN HCL 2000 MG/400ML IV SOLN
2000.0000 mg | Freq: Once | INTRAVENOUS | Status: AC
  Administered 2020-11-09: 2000 mg via INTRAVENOUS
  Filled 2020-11-09 (×2): qty 400

## 2020-11-09 MED ORDER — HYDROMORPHONE HCL 1 MG/ML IJ SOLN
0.5000 mg | Freq: Once | INTRAMUSCULAR | Status: AC
  Administered 2020-11-09: 0.5 mg via INTRAVENOUS
  Filled 2020-11-09: qty 1

## 2020-11-09 MED ORDER — ONDANSETRON HCL 4 MG/2ML IJ SOLN
4.0000 mg | Freq: Once | INTRAMUSCULAR | Status: AC
  Administered 2020-11-09: 4 mg via INTRAVENOUS
  Filled 2020-11-09: qty 2

## 2020-11-09 MED ORDER — GADOBUTROL 1 MMOL/ML IV SOLN
10.0000 mL | Freq: Once | INTRAVENOUS | Status: AC | PRN
  Administered 2020-11-09: 10 mL via INTRAVENOUS

## 2020-11-09 MED ORDER — MORPHINE SULFATE (PF) 4 MG/ML IV SOLN
4.0000 mg | Freq: Once | INTRAVENOUS | Status: AC
  Administered 2020-11-09: 4 mg via INTRAVENOUS
  Filled 2020-11-09: qty 1

## 2020-11-09 MED ORDER — VANCOMYCIN HCL IN DEXTROSE 1-5 GM/200ML-% IV SOLN
1000.0000 mg | Freq: Three times a day (TID) | INTRAVENOUS | Status: DC
  Administered 2020-11-09: 1000 mg via INTRAVENOUS
  Filled 2020-11-09: qty 200

## 2020-11-09 MED ORDER — SODIUM CHLORIDE 0.9 % IV SOLN
2.0000 g | Freq: Once | INTRAVENOUS | Status: AC
  Administered 2020-11-09: 2 g via INTRAVENOUS
  Filled 2020-11-09: qty 2

## 2020-11-09 MED ORDER — SODIUM CHLORIDE 0.9 % IV SOLN
2.0000 g | Freq: Three times a day (TID) | INTRAVENOUS | Status: DC
  Administered 2020-11-09: 2 g via INTRAVENOUS
  Filled 2020-11-09: qty 2

## 2020-11-09 MED ORDER — IOHEXOL 300 MG/ML  SOLN
100.0000 mL | Freq: Once | INTRAMUSCULAR | Status: AC | PRN
  Administered 2020-11-09: 100 mL via INTRAVENOUS

## 2020-11-09 NOTE — ED Triage Notes (Signed)
Pt reports ongoing pain and swelling to L hand and forearm x1 month, reports she has had labs to rule out multiple causes without much result, saw a specialist who wanted her to come here for further eval/possible mri due to her having no insurance until 12/1. Also endorses a boil to the R groin.

## 2020-11-09 NOTE — Consult Note (Signed)
Reason for Consult:Left wrist pain Referring Physician: Calla Kicks  Rachel Mckinney is an 44 y.o. female.  HPI: Rachel Mckinney presents to the ED with a 1 month hx/o left wrist and elbow pain. This started as a constellation of joint pain which involved her neck, right shoulder, and a few lower extremity joints. She was seen at [facility] twice and given what sounds like 2 Medrol dose packs. She had resolution of all the pain with the exception of the wrist/elbow which did not respond at all. She then saw rheumatology and was put on Prednisone, again with no resolution. She denies hx/o gout or similar e/o involving her wrist though has had diffuse joint pain before and has been on Humira. X-rays showed carpal erosions c/w osteo and orthopedic surgery was consulted.  Past Medical History:  Diagnosis Date  . Boils    under breast and inner thigh  . Hidradenitis suppurativa    Chronic problem for patient, multiple admissions for surgical drainage  . Hypertension   . Polyarthralgia     Past Surgical History:  Procedure Laterality Date  . CESAREAN SECTION    . multiple abscess drainages      Family History  Problem Relation Age of Onset  . Liver disease Father   . Hypertension Father   . Hypertension Sister     Social History:  reports that she has been smoking cigarettes. She has been smoking about 0.10 packs per day. She has never used smokeless tobacco. She reports current alcohol use. She reports previous drug use.  Allergies: No Known Allergies  Medications: I have reviewed the patient's current medications.  Results for orders placed or performed during the hospital encounter of 11/09/20 (from the past 48 hour(s))  CBC with Differential     Status: Abnormal   Collection Time: 11/09/20 11:42 AM  Result Value Ref Range   WBC 12.6 (H) 4.0 - 10.5 K/uL   RBC 2.90 (L) 3.87 - 5.11 MIL/uL   Hemoglobin 9.0 (L) 12.0 - 15.0 g/dL   HCT 29.9 (L) 36 - 46 %   MCV 103.1 (H) 80.0 - 100.0 fL   MCH 31.0  26.0 - 34.0 pg   MCHC 30.1 30.0 - 36.0 g/dL   RDW 15.6 (H) 11.5 - 15.5 %   Platelets 336 150 - 400 K/uL   nRBC 0.0 0.0 - 0.2 %   Neutrophils Relative % 81 %   Neutro Abs 10.4 (H) 1.7 - 7.7 K/uL   Lymphocytes Relative 10 %   Lymphs Abs 1.2 0.7 - 4.0 K/uL   Monocytes Relative 7 %   Monocytes Absolute 0.9 0.1 - 1.0 K/uL   Eosinophils Relative 1 %   Eosinophils Absolute 0.1 0.0 - 0.5 K/uL   Basophils Relative 0 %   Basophils Absolute 0.0 0.0 - 0.1 K/uL   Immature Granulocytes 1 %   Abs Immature Granulocytes 0.06 0.00 - 0.07 K/uL    Comment: Performed at [facility], [address] [place], [place] [address]  Basic metabolic panel     Status: Abnormal   Collection Time: 11/09/20 11:42 AM  Result Value Ref Range   Sodium 138 135 - 145 mmol/L   Potassium 3.9 3.5 - 5.1 mmol/L   Chloride 107 98 - 111 mmol/L   CO2 21 (L) 22 - 32 mmol/L   Glucose, Bld 92 70 - 99 mg/dL    Comment: Glucose reference range applies only to samples taken after fasting for at least 8 hours.  BUN 15 6 - 20 mg/dL   Creatinine, Ser 0.91 0.44 - 1.00 mg/dL   Calcium 8.8 (L) 8.9 - 10.3 mg/dL   GFR, Estimated >60 >60 mL/min    Comment: (NOTE) Calculated using the CKD-EPI Creatinine Equation (2021)    Anion gap 10 5 - 15    Comment: Performed at [facility] [address], [place], [place] [address]  Sedimentation rate     Status: Abnormal   Collection Time: 11/09/20 11:42 AM  Result Value Ref Range   Sed Rate 112 (H) 0 - 22 mm/hr    Comment: Performed at [facility] [address], [place], [place] [address]  C-reactive protein     Status: Abnormal   Collection Time: 11/09/20 11:42 AM  Result Value Ref Range   CRP 19.8 (H) <1.0 mg/dL    Comment: Performed at [facility] [address] [place], [place] [address]  Uric acid     Status: None   Collection Time: 11/09/20 11:42 AM  Result Value Ref Range   Uric Acid, Serum 5.1 2.5 - 7.1 mg/dL    Comment: Performed at [facility] [address], [place], [place] [address]    DG Elbow Complete Left  Result Date: 11/09/2020 CLINICAL DATA:  44 year old female with pain and swelling x1 month. EXAM: LEFT ELBOW - COMPLETE 3+ VIEW COMPARISON:  None. FINDINGS: Bone mineralization is within normal limits. Joint spaces and alignment appear preserved. No evidence of joint effusion. No osseous abnormality identified. No discrete soft tissue abnormality identified. IMPRESSION: Negative. Electronically Signed   By: Genevie Ann M.D.   On: 11/09/2020 12:57   DG Wrist Complete Left  Addendum Date: 11/09/2020   ADDENDUM REPORT: 11/09/2020 13:22 ADDENDUM: Study discussed by telephone with PA Harris in the ED on 11/09/2020 at 1311 hours. Electronically Signed   By: Genevie Ann M.D.   On: 11/09/2020 13:22   Result Date: 11/09/2020 CLINICAL DATA:  44 year old female with pain and swelling x1 month. EXAM: LEFT WRIST - COMPLETE 3+ VIEW COMPARISON:  Left elbow series today.  Left wrist series 05/08/2017. FINDINGS: There is new diffuse soft tissue swelling, and compared to the 2018 radiographs there are multifocal new areas of abnormal cortical osteolysis in and around the wrist, including radial styloid, radial margin of the scaphoid bone, piece of form bone and a especially along the ulnar margin of the 5th CMC joint. Also involvement about the junction of the 3rd and 4th CMC joints. No associated pathologic fracture is evident. Alignment is maintained. No soft tissue gas identified. IMPRESSION: Diffuse soft tissue swelling and multifocal areas of eroded bone around the wrist, new since 2018. Appearance is most suspicious for Acute Osteomyelitis. Recommend wrist MRI without and with contrast. Electronically Signed: By: Genevie Ann M.D. On: 11/09/2020 13:07   UE Venous Duplex (MC and [facility] ONLY)  Result Date: 11/09/2020 UPPER VENOUS STUDY  Indications: Edema and pain x1 month Comparison Study: No prior study Performing Technologist: Maudry Mayhew  MHA, RDMS, RVT, RDCS  Examination Guidelines: A complete evaluation includes B-mode imaging, spectral Doppler, color Doppler, and power Doppler as needed of all accessible portions of each vessel. Bilateral testing is considered an integral part of a complete examination. Limited examinations for reoccurring indications may be performed as noted.  Right Findings: +----------+------------+---------+-----------+----------+-------+ RIGHT     CompressiblePhasicitySpontaneousPropertiesSummary +----------+------------+---------+-----------+----------+-------+ Subclavian               Yes  Yes                      +----------+------------+---------+-----------+----------+-------+  Left Findings: +----------+------------+---------+-----------+----------+-------+ LEFT      CompressiblePhasicitySpontaneousPropertiesSummary +----------+------------+---------+-----------+----------+-------+ IJV           Full       Yes       Yes                      +----------+------------+---------+-----------+----------+-------+ Subclavian    Full       Yes       Yes                      +----------+------------+---------+-----------+----------+-------+ Axillary      Full       Yes       Yes                      +----------+------------+---------+-----------+----------+-------+ Brachial      Full       Yes       Yes                      +----------+------------+---------+-----------+----------+-------+ Radial        Full                                          +----------+------------+---------+-----------+----------+-------+ Ulnar         Full                                          +----------+------------+---------+-----------+----------+-------+ Cephalic      Full                                          +----------+------------+---------+-----------+----------+-------+ Basilic       Full                                           +----------+------------+---------+-----------+----------+-------+ Lymph node noted in the medial mid left arm measuring 1.9cm.  Summary:  Right: No evidence of thrombosis in the subclavian.  Left: No evidence of deep vein thrombosis in the upper extremity. No evidence of superficial vein thrombosis in the upper extremity. There is a lymph node noted in the medial mid left arm measuring 1.9cm.  *See table(s) above for measurements and observations.    Preliminary     Review of Systems  Constitutional: Positive for fatigue. Negative for chills, diaphoresis and fever.  HENT: Negative for ear discharge, ear pain, hearing loss and tinnitus.   Eyes: Negative for photophobia and pain.  Respiratory: Negative for cough and shortness of breath.   Cardiovascular: Negative for chest pain.  Gastrointestinal: Negative for abdominal pain, nausea and vomiting.  Genitourinary: Negative for dysuria, flank pain, frequency and urgency.  Musculoskeletal: Positive for arthralgias (Left wrist, elbow). Negative for back pain, myalgias and neck pain.  Neurological: Negative for dizziness and headaches.  Hematological: Does not bruise/bleed easily.  Psychiatric/Behavioral: The patient is not nervous/anxious.    Blood pressure (!) 152/80, pulse  73, temperature 98.6 F (37 C), resp. rate 18, SpO2 95 %. Physical Exam Constitutional:      General: She is not in acute distress.    Appearance: She is well-developed. She is not diaphoretic.  HENT:     Head: Normocephalic and atraumatic.  Eyes:     General: No scleral icterus.       Right eye: No discharge.        Left eye: No discharge.     Conjunctiva/sclera: Conjunctivae normal.  Cardiovascular:     Rate and Rhythm: Normal rate and regular rhythm.  Pulmonary:     Effort: Pulmonary effort is normal. No respiratory distress.  Musculoskeletal:     Cervical back: Normal range of motion.     Comments: Left shoulder, elbow, wrist, digits- no skin wounds, severe TTP  wrist/elbow sparing central FA, no instability, no blocks to motion except pain, essentially no AROM wrist and limited elbow, wrist edematous  Sens  Ax/R/M/U intact  Mot   Ax/ R/ PIN/ M/ AIN/ U intact  Rad 2+  Skin:    General: Skin is warm and dry.  Neurological:     Mental Status: She is alert.  Psychiatric:        Behavior: Behavior normal.     Assessment/Plan: Left wrist/elbow pain -- Her history and additional involvement of elbow make septic arthritis unlikely. I imagine this is a rheumatoid flare or a crystalline arthropathy. Agree with MRI pending. If that shows a joint effusion then would get IR to tap for cell count, GS, and C&S. Will provide wrist splint and sling for comfort.    Lisette Abu, PA-C Orthopedic Surgery [phone] 11/09/2020, 4:03 PM

## 2020-11-09 NOTE — ED Notes (Signed)
Pt ambulated self to beside restroom with steady gait.

## 2020-11-09 NOTE — ED Notes (Signed)
Patient transported to MRI 

## 2020-11-09 NOTE — ED Provider Notes (Signed)
[facility] EMERGENCY DEPARTMENT Provider Note   CSN: [id number] Arrival date & time: 11/09/20  1003     History Chief Complaint  Patient presents with  . Arm Swelling  . Recurrent Skin Infections    Rachel Mckinney is a 44 y.o. female with a past medical history of polyarthralgia and hidradenitis suppurativa.  She has been seen in the past by rheumatology for her joint pain.  Recently the patient began having pain and swelling in the left upper extremity for about 1 month.  Pain is severe.  She is unable to bend her left elbow.  She has pain and swelling all the way down to her fingers.  She was seen by [facility] rheumatology on 11/06/2020.  She has been told that she needs DVT rule out and might need an MRI of the arm to rule out deep space infection.  She denies fevers or chills but has been on 2 courses of prednisone which have not given her much improvement.  She also complains of a labial abscess on the right.  She is no recent injuries to the arm, no recent outbreaks of hidradenitis in the axilla.  HPI     Past Medical History:  Diagnosis Date  . Boils    under breast and inner thigh  . Hidradenitis suppurativa    Chronic problem for patient, multiple admissions for surgical drainage  . Hypertension   . Polyarthralgia     Patient Active Problem List   Diagnosis Date Noted  . Suicidal ideation 10/19/2020  . Polyarthralgia 09/10/2020  . Tobacco abuse 05/12/2015  . HTN (hypertension) 05/12/2015  . Hidradenitis suppurativa-chronic and involving multiple sites 02/10/2012  . Morbid obesity ([place]) 11/16/2011    Past Surgical History:  Procedure Laterality Date  . CESAREAN SECTION    . multiple abscess drainages       OB History    Gravida  3   Para      Term      Preterm      AB  2   Living  1     SAB      TAB      Ectopic      Multiple      Live Births  1           Family History  Problem Relation Age of Onset  . Liver  disease Father   . Hypertension Father   . Hypertension Sister     Social History   Tobacco Use  . Smoking status: Current Every Day Smoker    Packs/day: 0.10    Types: Cigarettes  . Smokeless tobacco: Never Used  Vaping Use  . Vaping Use: Never used  Substance Use Topics  . Alcohol use: Yes    Comment: on weekends  . Drug use: Not Currently    Home Medications Prior to Admission medications   Medication Sig Start Date End Date Taking? Authorizing Provider  acetaminophen (TYLENOL) 500 MG tablet Take 500-1,000 mg by mouth every 6 (six) hours as needed for mild pain.   Yes [provider]  cholecalciferol (VITAMIN D3) 25 MCG (1000 UNIT) tablet Take 1,000 Units by mouth daily.   Yes [provider]  Multiple Vitamin (MULTI-VITAMIN) tablet Take 1 tablet by mouth daily.   Yes [provider]  Turmeric (QC TUMERIC COMPLEX) 500 MG CAPS Take by mouth.   Yes [provider]  Black Pepper-Turmeric (TURMERIC COMPLEX/BLACK PEPPER) 3-500 MG CAPS Take 1  capsule by mouth daily. Patient not taking: Reported on 11/09/2020    [provider]  naproxen (NAPROSYN) 500 MG tablet Take 1 tablet (500 mg total) by mouth 2 (two) times daily with a meal. Patient not taking: Reported on 11/09/2020 10/19/20   Wilber Oliphant, MD  pantoprazole (PROTONIX) 40 MG tablet Take 1 tablet (40 mg total) by mouth daily. Patient not taking: Reported on 11/09/2020 10/19/20   Wilber Oliphant, MD    Allergies    Patient has no known allergies.  Review of Systems   Review of Systems Ten systems reviewed and are negative for acute change, except as noted in the HPI.  Physical Exam Updated Vital Signs BP (!) 163/91   Pulse 87   Temp 98.6 F (37 C)   Resp 15   SpO2 100%   Physical Exam Vitals and nursing note reviewed.  Constitutional:      General: She is not in acute distress.    Appearance: She is well-developed. She is not diaphoretic.  HENT:     Head: Normocephalic  and atraumatic.  Eyes:     General: No scleral icterus.    Conjunctiva/sclera: Conjunctivae normal.  Cardiovascular:     Rate and Rhythm: Normal rate and regular rhythm.     Heart sounds: Normal heart sounds. No murmur heard.  No friction rub. No gallop.   Pulmonary:     Effort: Pulmonary effort is normal. No respiratory distress.     Breath sounds: Normal breath sounds.  Abdominal:     General: Bowel sounds are normal. There is no distension.     Palpations: Abdomen is soft. There is no mass.     Tenderness: There is no abdominal tenderness. There is no guarding.  Musculoskeletal:     Cervical back: Normal range of motion.     Comments: Left arm with circumferential swelling from just above the left elbow through the distal fingertips.  The arm is exquisitely tender to palpation throughout.  She is unable to move the elbow without pain.  Skin:    General: Skin is warm and dry.  Neurological:     Mental Status: She is alert and oriented to person, place, and time.  Psychiatric:        Behavior: Behavior normal.      ED Results / Procedures / Treatments   Labs (all labs ordered are listed, but only abnormal results are displayed) Labs Reviewed  CBC WITH DIFFERENTIAL/PLATELET  BASIC METABOLIC PANEL  SEDIMENTATION RATE  C-REACTIVE PROTEIN  URIC ACID    EKG None  Radiology No results found.  Procedures Procedures (including critical care time)  Medications Ordered in ED Medications  HYDROmorphone (DILAUDID) injection 0.5 mg (has no administration in time range)  ondansetron (ZOFRAN) injection 4 mg (has no administration in time range)    ED Course  I have reviewed the triage vital signs and the nursing notes.  Pertinent labs & imaging results that were available during my care of the patient were reviewed by me and considered in my medical decision making (see chart for details).  Clinical Course as of Nov 09 1613  Mon Nov 09, 2020  1413 Sed Rate(!): 112 [AH]   1413 CRP(!): 19.8 [AH]  1413 Patient's exam concerning for osteomyelitis of the left wrist.  I reviewed the images by phone with Dr. Gaspar Cola.  Additionally patient's sed rate is markedly elevated with elevated CRP also indicative of potential osteomyelitis.  MRI is pending.  WBC(!): 12.6 [  AH]    Clinical Course User Index [AH] Margarita Mail, PA-C   MDM Rules/Calculators/A&P                         44 year old female here with left hand pain and swelling for 1 month.  Differential diagnosis includes septic joint, gout, osteomyelitis, soft tissue infection. she has been on steroids without any significant improvement in her pain.  I ordered interpreted and reviewed patient's labs which shows elevated white blood cell count of 12.6 on CBC, macrocytic anemia, BMP without significant abnormality, sed rate and CRP are significantly elevated.  Uric acid within normal limits, blood cultures are pending. I ordered and reviewed a wrist and elbow x-ray.  I had a discussion with Dr. Nevada Crane who is concerned for potential osteomyelitis of the left wrist.  Patient is immunocompromise on Humira.  I have ordered a follow-up MRI.  I consulted with PA Silvestre Gunner of orthopedic hand service who will place recommendations on the patient.  I given signout to Dr. Sherry Ruffing is a patient also has a labial abscess however has been in the hallway for her entire visit and I have been unable to assess this.  Patient given an vancomycin and cefepime for coverage of infection. Final Clinical Impression(s) / ED Diagnoses Final diagnoses:  None    Rx / DC Orders ED Discharge Orders    None       Margarita Mail, PA-C 11/09/20 1618    Charlesetta Shanks, MD 11/10/20 [phone]

## 2020-11-09 NOTE — Progress Notes (Signed)
Left upper extremity venous duplex completed. Refer to "CV Proc" under chart review to view preliminary results.  11/09/2020 3:31 PM Kelby Aline., MHA, RVT, RDCS, RDMS

## 2020-11-09 NOTE — ED Notes (Signed)
Pt taken to XRAY

## 2020-11-09 NOTE — ED Provider Notes (Signed)
3:51 PM Care assumed from [facility] and Dr. Vallery Ridge.  At time of transfer of care, patient is already on antibiotics for likely osteomyelitis of the wrist.  She is awaiting MRI and hand consultation.  She will need admission for osteomyelitis however, there was a question of possible labial abscess to be evaluated.  As patient was initially in a hall bed,  she has been waiting for placement into a private exam room.  After this is done, we will examine the patient and determine if she needs any intervention prior to admission for this possible labial abscess.  9:04 PM Patient was finally placed into an exam room.  A chaperone was used and I was able to inspect the patient's groin.  Patient does have a small area that is draining purulence and some fluctuance and tenderness in her right groin.  There is some fluctuance that appears to be tracking up her inguinal area and into the mons pubis.  There is swelling which the patient reports is new.  She reports it is tender but not as tender as the right groin.  She denies any other vaginal bleeding or discharge.  Given the patient's multiple surgeries and scar tissue in the groin, the purulence seen on exam, and the fluctuance and erythema that does appear to be tracking more superiorly, I am concerning to rule out the infection tracking deep and worsening towards Fournier's gangrene.  We will get a CT scan to rule this out.  If there is a small abscess, will likely do bedside drainage however if it looks like it is going deeper or more extensive, we will likely touch base with a surgical specialty.  Anticipate reassessment and admission for overall management of her possible septic arthritis versus inflammatory arthritis and then this groin infection.  12:42 AM CT scan returned showing no definite evidence of abscess but does show diffuse cellulitis in the groin.  She will be admitted for further management but otherwise no abscess seen, we will not perform  any surgical intervention at this time.    Clinical Impression: 1. Cellulitis, unspecified cellulitis site   2. Left arm pain     Disposition: Admit  This note was prepared with assistance of Dragon voice recognition software. Occasional wrong-word or sound-a-like substitutions may have occurred due to the inherent limitations of voice recognition software.       Naida Escalante, Gwenyth Allegra, MD 11/10/20 [phone]

## 2020-11-09 NOTE — ED Notes (Signed)
Pt taken to Vascular

## 2020-11-09 NOTE — Progress Notes (Signed)
Pharmacy Antibiotic Note  Rachel Mckinney is a 44 y.o. female admitted on 11/09/2020 with possible osteomyelitis.  Pharmacy has been consulted for vancomycin and cefepime dosing. PT is afebrile and WBC is elevated at 12.6. SCr is WNL  Plan: Cefpieme 2gm IV Q8H Vancomycin 2gm IV x 1 then 1gm IV Q8H F/u renal fxn, C&S, clinical status and trough at SS F/u MRI    Temp (24hrs), Avg:98.6 F (37 C), Min:98.6 F (37 C), Max:98.6 F (37 C)  Recent Labs  Lab 11/09/20 1142  WBC 12.6*  CREATININE 0.91    Estimated Creatinine Clearance: 91.6 mL/min (by C-G formula based on SCr of 0.91 mg/dL).    No Known Allergies  Antimicrobials this admission: Vanc 11/22>> Cefepime 11/22>>  Dose adjustments this admission: N/A  Microbiology results: Pending  Thank you for allowing pharmacy to be a part of this patient's care.  Najat Olazabal, Rande Lawman 11/09/2020 2:34 PM

## 2020-11-09 NOTE — Progress Notes (Signed)
Orthopedic Tech Progress Note Patient Details:  Rachel Mckinney [birth year] [id number]  Ortho Devices Type of Ortho Device: Sling immobilizer, Velcro wrist splint Ortho Device/Splint Location: Left Upper Extremity Ortho Device/Splint Interventions: Ordered, Application, Adjustment   Post Interventions Patient Tolerated: Well Instructions Provided: Adjustment of device, Care of device, Poper ambulation with device   [name] [name] 11/09/2020, 5:18 PM

## 2020-11-10 ENCOUNTER — Emergency Department (HOSPITAL_COMMUNITY): Payer: Self-pay

## 2020-11-10 ENCOUNTER — Other Ambulatory Visit: Payer: Self-pay

## 2020-11-10 ENCOUNTER — Observation Stay (HOSPITAL_COMMUNITY): Payer: Self-pay

## 2020-11-10 ENCOUNTER — Encounter (HOSPITAL_COMMUNITY): Payer: Self-pay | Admitting: Family Medicine

## 2020-11-10 DIAGNOSIS — R9389 Abnormal findings on diagnostic imaging of other specified body structures: Secondary | ICD-10-CM | POA: Diagnosis present

## 2020-11-10 DIAGNOSIS — Z7952 Long term (current) use of systemic steroids: Secondary | ICD-10-CM | POA: Diagnosis present

## 2020-11-10 DIAGNOSIS — D84821 Adverse effect of glucocorticoids and synthetic analogues, initial encounter: Secondary | ICD-10-CM | POA: Diagnosis present

## 2020-11-10 DIAGNOSIS — L732 Hidradenitis suppurativa: Secondary | ICD-10-CM

## 2020-11-10 DIAGNOSIS — M25522 Pain in left elbow: Secondary | ICD-10-CM | POA: Diagnosis present

## 2020-11-10 DIAGNOSIS — T380X5A Adverse effect of glucocorticoids and synthetic analogues, initial encounter: Secondary | ICD-10-CM

## 2020-11-10 DIAGNOSIS — M659 Synovitis and tenosynovitis, unspecified: Secondary | ICD-10-CM

## 2020-11-10 DIAGNOSIS — M65832 Other synovitis and tenosynovitis, left forearm: Secondary | ICD-10-CM | POA: Diagnosis present

## 2020-11-10 DIAGNOSIS — R700 Elevated erythrocyte sedimentation rate: Secondary | ICD-10-CM | POA: Diagnosis present

## 2020-11-10 DIAGNOSIS — R591 Generalized enlarged lymph nodes: Secondary | ICD-10-CM

## 2020-11-10 DIAGNOSIS — M25532 Pain in left wrist: Secondary | ICD-10-CM

## 2020-11-10 DIAGNOSIS — L0390 Cellulitis, unspecified: Secondary | ICD-10-CM

## 2020-11-10 DIAGNOSIS — M79602 Pain in left arm: Secondary | ICD-10-CM

## 2020-11-10 DIAGNOSIS — M25571 Pain in right ankle and joints of right foot: Secondary | ICD-10-CM | POA: Diagnosis present

## 2020-11-10 DIAGNOSIS — M2550 Pain in unspecified joint: Secondary | ICD-10-CM

## 2020-11-10 DIAGNOSIS — M65932 Unspecified synovitis and tenosynovitis, left forearm: Secondary | ICD-10-CM

## 2020-11-10 HISTORY — DX: Pain in left wrist: M25.532

## 2020-11-10 HISTORY — DX: Synovitis and tenosynovitis, unspecified: M65.9

## 2020-11-10 HISTORY — DX: Immunodeficiency due to drugs: D84.821

## 2020-11-10 HISTORY — DX: Adverse effect of glucocorticoids and synthetic analogues, initial encounter: T38.0X5A

## 2020-11-10 HISTORY — DX: Long term (current) use of systemic steroids: Z79.52

## 2020-11-10 HISTORY — DX: Unspecified synovitis and tenosynovitis, left forearm: M65.932

## 2020-11-10 HISTORY — DX: Generalized enlarged lymph nodes: R59.1

## 2020-11-10 HISTORY — DX: Abnormal findings on diagnostic imaging of other specified body structures: R93.89

## 2020-11-10 LAB — LACTIC ACID, PLASMA: Lactic Acid, Venous: <0.3 mmol/L — ABNORMAL LOW (ref 0.5–1.9)

## 2020-11-10 LAB — VITAMIN B12: Vitamin B-12: 801 pg/mL (ref 180–914)

## 2020-11-10 LAB — CBC WITH DIFFERENTIAL/PLATELET
Abs Immature Granulocytes: 0.09 10*3/uL — ABNORMAL HIGH (ref 0.00–0.07)
Basophils Absolute: 0 10*3/uL (ref 0.0–0.1)
Basophils Relative: 0 %
Eosinophils Absolute: 0.1 10*3/uL (ref 0.0–0.5)
Eosinophils Relative: 1 %
HCT: 27.9 % — ABNORMAL LOW (ref 36.0–46.0)
Hemoglobin: 8.5 g/dL — ABNORMAL LOW (ref 12.0–15.0)
Immature Granulocytes: 1 %
Lymphocytes Relative: 9 %
Lymphs Abs: 1.3 10*3/uL (ref 0.7–4.0)
MCH: 30.9 pg (ref 26.0–34.0)
MCHC: 30.5 g/dL (ref 30.0–36.0)
MCV: 101.5 fL — ABNORMAL HIGH (ref 80.0–100.0)
Monocytes Absolute: 1 10*3/uL (ref 0.1–1.0)
Monocytes Relative: 7 %
Neutro Abs: 11.9 10*3/uL — ABNORMAL HIGH (ref 1.7–7.7)
Neutrophils Relative %: 82 %
Platelets: 328 10*3/uL (ref 150–400)
RBC: 2.75 MIL/uL — ABNORMAL LOW (ref 3.87–5.11)
RDW: 15.4 % (ref 11.5–15.5)
WBC: 14.4 10*3/uL — ABNORMAL HIGH (ref 4.0–10.5)
nRBC: 0 % (ref 0.0–0.2)

## 2020-11-10 LAB — COMPREHENSIVE METABOLIC PANEL
ALT: 12 U/L (ref 0–44)
AST: 8 U/L — ABNORMAL LOW (ref 15–41)
Albumin: 2.6 g/dL — ABNORMAL LOW (ref 3.5–5.0)
Alkaline Phosphatase: 89 U/L (ref 38–126)
Anion gap: 9 (ref 5–15)
BUN: 8 mg/dL (ref 6–20)
CO2: 22 mmol/L (ref 22–32)
Calcium: 8.6 mg/dL — ABNORMAL LOW (ref 8.9–10.3)
Chloride: 104 mmol/L (ref 98–111)
Creatinine, Ser: 0.74 mg/dL (ref 0.44–1.00)
GFR, Estimated: >60 mL/min (ref >60)
Glucose, Bld: 108 mg/dL — ABNORMAL HIGH (ref 70–99)
Potassium: 3.8 mmol/L (ref 3.5–5.1)
Sodium: 135 mmol/L (ref 135–145)
Total Bilirubin: 0.5 mg/dL (ref 0.3–1.2)
Total Protein: 6.7 g/dL (ref 6.5–8.1)

## 2020-11-10 LAB — FOLATE: Folate: 17.2 ng/mL (ref >5.9)

## 2020-11-10 MED ORDER — MORPHINE SULFATE (PF) 4 MG/ML IV SOLN
4.0000 mg | INTRAVENOUS | Status: AC | PRN
  Administered 2020-11-10 (×3): 4 mg via INTRAVENOUS
  Filled 2020-11-10 (×3): qty 1

## 2020-11-10 MED ORDER — POLYETHYLENE GLYCOL 3350 17 G PO PACK: 17.0000 g | PACK | Freq: Every day | ORAL | Status: DC | PRN

## 2020-11-10 MED ORDER — ACETAMINOPHEN 650 MG RE SUPP: 650.0000 mg | Freq: Four times a day (QID) | RECTAL | Status: DC | PRN

## 2020-11-10 MED ORDER — HYDROCODONE-ACETAMINOPHEN 5-325 MG PO TABS: ORAL_TABLET | ORAL | 0 refills | Status: DC

## 2020-11-10 MED ORDER — NAPROXEN 250 MG PO TABS
500.0000 mg | ORAL_TABLET | Freq: Two times a day (BID) | ORAL | Status: DC
  Administered 2020-11-10: 500 mg via ORAL
  Filled 2020-11-10 (×2): qty 2

## 2020-11-10 MED ORDER — NAPROXEN 500 MG PO TABS: 500.0000 mg | ORAL_TABLET | Freq: Two times a day (BID) | ORAL | 0 refills | Status: DC

## 2020-11-10 MED ORDER — ENOXAPARIN SODIUM 40 MG/0.4ML ~~LOC~~ SOLN
40.0000 mg | SUBCUTANEOUS | Status: DC
  Administered 2020-11-10: 40 mg via SUBCUTANEOUS
  Filled 2020-11-10: qty 0.4

## 2020-11-10 MED ORDER — ACETAMINOPHEN 325 MG PO TABS
650.0000 mg | ORAL_TABLET | Freq: Four times a day (QID) | ORAL | Status: DC | PRN
  Administered 2020-11-10: 650 mg via ORAL
  Filled 2020-11-10: qty 2

## 2020-11-10 MED ORDER — PANTOPRAZOLE SODIUM 40 MG PO TBEC: 40.0000 mg | DELAYED_RELEASE_TABLET | Freq: Every day | ORAL | 0 refills | Status: DC

## 2020-11-10 NOTE — Progress Notes (Signed)
Discharge instructions provided with discharge packet.  Patient discharged to home with follow up

## 2020-11-10 NOTE — Plan of Care (Signed)

## 2020-11-10 NOTE — H&P (Signed)
I have interviewed and examined the patient.  I have discussed the case with Dr. Manus Rudd and Dr Bolivar Haw.   I agree with their documentation and management in their admission note.   Principal Problem:   Polyarthralgia         -   Left wrist pain & Left elbow pain with inflammatory changes; Right ankle pain without inflammatory changes                     - First consultation at [facility] for "polyarthralgia" involving left arm and right ankle 09/08/20. Has since been seen for same complaint at [facility] twice and ED twice.  Poorly responsive to 4? courses of systemic corticosteroid therapy, including her last tapering course over 20 days started at 10/19/20 at [facility]                      - Initial Rheum consultation with Dr Wendall Mola at [facility] 3 days prior to this admission.                                -  History of ANA positive                                -  Sedimentation rate elevation                                                                - Ddx: Autoimmune Arthritis, Crystalline Arthritis, and Infectious arthrititis (currently immunosuppressed due to multiple courses of prednisone over last 8 weeks, and on MAb therapy until June 2019, seeding source could be inguinal hydradenitis)                                   - Left wrist MRI 11/232/21: Severe radiocarpal and carpal synovitis; Erosions and edema in carpal and metacarpal bases Active Problems: Tenosynovitis extensor and flexor compartments; possible cellulitis                                  - Left arm venous doppler US:   Left medial elbow lymphadenopathy    Hidradenitis suppurativa-chronic, inguinal                     - Abnormal finding on CT scan of Pelvis: Possible cellulitis right groin, question of fistula vs cellulitis right gluteal crease  Plan: Consultation with IR about possible arthrocentesis of left wrist for synovial fluid analysis, crystal microscopy, and gm stain/culture.  Consultation with orthopedics  Stop antibiotics for  now.   Taper prednisone to 5 to 7.5 mg daily for now with possible taper off completely.   DG XR right ankle/foot  Consider starting Abx for hydradenitis flare if the left arm condition is not thought to be infectious.   F/U blood cultures.   Obtain Rheum labs recommended by Dr Graylin Shiver (Rheum) - contact Dr Graylin Shiver with results.   Obtain Urine GC/Chlamydia      Status is:  Remains inpatient appropriate because:Ongoing diagnostic testing needed not appropriate for outpatient work up and potential for disease process requiring surgical intervention and IV antibiotics    Dispo: The patient is from: Home              Anticipated d/c is to: Home              Anticipated d/c date: 11/13/20               For questions or updates, please contact Family Medicine Teaching Service Resident On-call at pager [phone] or  [email or web address] - see pager "[place]"

## 2020-11-10 NOTE — Discharge Instructions (Signed)
Dear Rachel Mckinney,   Thank you for letting us participate in your care! In this section, you will find a brief summary of why you were admitted to the hospital, what happened during your admission, your diagnosis/diagnoses, and recommended follow up.   You were admitted because you were experiencing joint pain and swelling, particularly in your left arm and right ankle.   We did several studies including an ultrasound and MRI of your arm. They did not show any blood clots or evidence of bone infection. We also looked at an x-ray of your ankle which was normal. We checked several labs- some of which are still pending.  You have an appointment on Nov. 29th at 3:10pm (arrive by 3:00) with Dr Maudie Mercury at the [facility].  You also have an appointment on December 6th at 11:00am (arrive by 10:45am) with Dr. Graylin Shiver (the rheumatologist) at [place].   POST-HOSPITAL & CARE INSTRUCTIONS Please see medications section of this packet for any medication changes.   Take Tylenol Arthritis 650mg  every 8 hours (3 times per day) in addition to your Naproxen 500mg  twice daily. Please also take your Protonix 40mg  daily to prevent any stomach ulcers. Lastly, I have prescribed a few tablets of Norco (a very strong opioid pain medication that can be addictive). Please only use this as needed for severe pain.  DOCTOR'S APPOINTMENT & FOLLOW UP CARE INSTRUCTIONS  Future Appointments  Date Time Provider [place]  11/16/2020  3:10 PM Wilber Oliphant, MD [facility]   11/23/2020 @ 11:00am with Dr. Graylin Shiver (rheumatology). Location: [place]  Thank you for choosing [facility]! Take care and be well!  [facility]  [place], [place] [id number] [phone]

## 2020-11-10 NOTE — Progress Notes (Addendum)
FPTS Interim Progress Note  Spoke with Dr. Anselm Pancoast from IR. After discussing with his [facility] colleagues and reviewing patient's MRI, they feel her edema is predominately from tenosynovitis and there is nothing to drain/aspirate at this time.  Also spoke with PA Jacqulynn Cadet from ortho who saw the patient yesterday. He agrees that there is no effusion to tap and it would not be fruitful at this time.   Spoke with patient's rheumatologist, Dr. Wendall Mola, at [facility] who felt that a diagnostic tap is the best option to further differentiate a crystalline vs. infectious etiology at this point. He feels an inflammatory arthropathy is less likely as patient had a full rheumatologic workup in 2019 which was negative.  He would be happy to see the patient in follow-up (early December) and attempt a tap himself if we're unable to do so here, although her insurance coverage reportedly does not begin until January.   Alcus Dad, MD 11/10/2020, 1:10 PM PGY-1, [place] Medicine Service pager [phone]

## 2020-11-10 NOTE — H&P (Addendum)
[facility] Admission History and Physical Service Pager: [phone]  Patient name: Rachel Mckinney Medical record number: [id number] Date of birth: 07-Nov-1976 Age: 44 y.o. Gender: female  Primary Care Provider: Patriciaann Clan, DO Consultants: Ortho Code Status: Full Preferred Emergency Contact: Mother, Ms. Rodman Pickle, [phone]  Chief Complaint: Arm pain   Assessment and Plan: Rachel Mckinney is a 44 y.o. female presenting with persistent left arm pain/swelling and right groin pain. PMH is significant for hypertension and Hidradentits Suppurativa.   Left Forearm Swelling & Pain Patient presented with significant right arm pain and swelling for at least the past month now.  Hemodynamically stable.  Exam significant for soft tissue swelling and tenderness from elbow down.  Has been treated with multiple courses of steroids including recent 15 day course that helped with pain but did not decrease swelling.  Patient sees Rheumatology, last on 11/19. Recommended by Rheum to come to ED to rule out deep tissue infection, DVT, or crystalline arthropathy.  She was previously evaluated by rheumatology for polyarthralgias in 2018 with negative lab work-up.  Venous doppler of L arm showed no signs of DVT.  No concerning signs of systemic infection.  WBC elevated at 12.6. LA <0.3.  CRP and ESR both significantly elevated (19.8 and 112 respectively).  Uric Acid- normal.  Less likely gout.  Believe pain is likely due to inflammatory process but cannot completely rule out infectious etiology.  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 However, still quite atypical that she had minimal  improvement with several steroid courses recently which raises the question of alternative or concurrent etiology.  Given her multi-joint involvement and duration, feel that infectious arthropathy is less likely however cannot be excluded by imaging. Recent gc/ch negative.  Unfortunately, due to initial concern for osteomyelitis she had already received 2 doses of IV vancomycin/cefepime in the ED which may impact aspiration if able to be done.  - Admit to FPTS med surg, Dr McDiarmid attending - Ortho consulted in ED prior to MRI findings, will follow up with IR this a.m. per their recommendations to review imaging to assess if aspiration may be possible  -Vital signs q4hr -Discontinue vancomycin/cefepime for now given less concern for osteomyelitis  - Obtain Rhematology serology including Anti-DNA, C3, C4, CCP, RF, U P/cr ratio per recommendations of Dr.Thapa with [facility] rheumatology in most recent office visit - AM CBC, CMP, UA - f/u BCx - Morphine 4 mg q3hr PRN for pain control - Zofran 4 mg q4hr prn for nausea  Hidradentits Suppurativa: Acute on Chronic.  20+ year history, follows with dermatology, plastic surgery, and OB/GYN for this.  She has had numerous groin surgeries, including most recently partial vulvectomy in 02/2020, and previously was on Humira/Remicade until June 2021 for suppression.  She reports a new actively draining boil within her right groin over the past 2 days, due to erythema and scarring on ED eval, a CT groin was obtained to rule out any internal tracking/abscess collection.  CT showed no definitive abscess, but did show mild skin thickening possibly representing cellulitis in vulva/right groin vs fistula along gluteal crease.  On our evaluation, did note indurated small purulent draining boil in her right groin, however without any significant erythema (may have been impacted  by recent abx).  Question if her chronic scarring may have impacted CT results. -Hold off on any further  antibiotics for now in case possible aspiration for above, would reevaluate later today and consider oral antibiotics for HS flare -Recommend review of CT with general surgery given possible fistula formation though suspect this may be less likely -Follow-up with dermatology to restart suppressive therapy   Hypertension SBP ranging 140-160s since arrival.  Not on any medications at home, question of elevated pressures may related to pain vs anxiety of medical work-up. - Continue to monitor BP -Can consider starting medication prior to DC vs close follow-up with PCP  Macrocytic anemia: Acute on chronic. Hemoglobin 9, BL around 10-11.  MCV 103.  Unclear etiology may be multifactorial with possible chronic disease as above, does not appear she has had a work-up previously. -Monitor CBC -Obtain a B12/folate level -Could also consider iron panel/ferritin however with inflammatory state as above may not be helpful at this time  FEN/GI: Full  Prophylaxis: Lovenox  Disposition: Med/Surg  History of Present Illness:  Rachel Mckinney is a 44 y.o. female presenting for left arm/wrist pain and swelling for the past few months.  She presents to the ED for further evaluation at the request of her rheumatologist to rule out infectious/blood clot etiology of her swelling.  She reports left arm/wrist and right foot pain for the past month, no recent worsening in frequency or severity of pain since onset. Swollen from elbow down on the left for the past month without any associated redness or rash in the area.  She previously has had different joint pains elsewhere, most recently in her neck, behind shoulders, and lower back.  She has been seen by orthopedics in the past for right arm/wrist pain.  In the past she has received steroids with improvement in the pain or improved over time.  Most recently for her current ache/swelling, she has been seen at urgent care and her PCPs office several times and has been on  several steroid courses over the past month (most recently finished prednisone last week).  Reports varying dosages, did not notice much difference in swelling of her arm but did seem to help with the pain.  She reports chronic general fatigue.  Occasionally she does have "breakout "over her eyelids, but otherwise denies any facial or extremity rash.  No previous history of IBD or psoriasis.  Does note that she sometimes gets tender nodules on her fingers and is not sure where these come from.  She recently followed up with her rheumatologist at [facility] on 11/19.  This was her first follow-up in 2 years, initially consulted with rheumatology for similar polyarthralgias in 2019.  Her laboratory work-up at that time showed ANA positive, however lupus specific antibodies were negative.  On 11/06/2020, her rheumatologist suggested that she should have further work-up to rule out any DVT or deep tissue infection given that she had minimal improvement with steroids (although inflammatory arthropathy was still on differential).  He also placed labs but these have not been collected.  In addition to this, she has had hidradenitis suppurativa for about 20 years.  She follows with dermatology for Remicade injections, however had to discontinue in June due to insurance difficulties.  She also previously took Humira for this however has not been on this in quite some time.  She reports a new draining boil in her right groin for the past 2 days.  Reports it started draining on its  own prior to arrival in the ED yesterday.  She denies any associated rash, fever with this.  She has had extensive Gyn surgeries due to her HS, most recently partial vulvectomy with creation of lateral flap by plastic surgery in 02/2020.  Social smoker and alcohol use on weekends only. Occasional THC use, last week.   ED course: On arrival, she was afebrile and hemodynamically stable.  DVT U/S performed on left upper extremity without  evidence of clot.  X-ray of her left hand/wrist showed concerns for osteomyelitis, thus MRI was obtained.  Due to concern for osteomyelitis, she was started on vancomycin and cefepime with orthopedic consult.  Hilbert Odor, orthopedics, evaluated with suspicion for inflammatory versus crystalline arthropathy and to consider aspiration via IR if effusion present on MRI.  There is synovitis through her radiocarpal joint and carpometacarpal articulations with erosions and ill-defined edema--felt this is highly suspicious for inflammatory or crystalline arthropathy, less likely infectious with several joints but could not exclude via imaging.  Additionally given scarring and redness of groin area, CT pelvis was collected which is concerning for cellulitis of groin area.  FPTS was consulted for admission for further evaluation of her left arm swelling and groin cellulitis.  Review Of Systems: Per HPI with the following additions:   Review of Systems  Constitutional: Positive for fatigue. Negative for fever.  HENT: Negative for facial swelling.   Respiratory: Negative for shortness of breath.   Cardiovascular: Negative for chest pain.  Gastrointestinal: Negative for constipation, diarrhea, nausea and vomiting.  Musculoskeletal: Positive for arthralgias and joint swelling.  Skin: Positive for rash.       On eyelids     Patient Active Problem List   Diagnosis Date Noted  . Suicidal ideation 10/19/2020  . Polyarthralgia 09/10/2020  . Tobacco abuse 05/12/2015  . HTN (hypertension) 05/12/2015  . Hidradenitis suppurativa-chronic and involving multiple sites 02/10/2012  . Morbid obesity ([place]) 11/16/2011    Past Medical History: Past Medical History:  Diagnosis Date  . Boils    under breast and inner thigh  . Hidradenitis suppurativa    Chronic problem for patient, multiple admissions for surgical drainage  . Hypertension   . Polyarthralgia     Past Surgical History: Past Surgical History:   Procedure Laterality Date  . CESAREAN SECTION    . multiple abscess drainages      Social History: Social History   Tobacco Use  . Smoking status: Current Every Day Smoker    Packs/day: 0.10    Types: Cigarettes  . Smokeless tobacco: Never Used  Vaping Use  . Vaping Use: Never used  Substance Use Topics  . Alcohol use: Yes    Comment: on weekends  . Drug use: Not Currently   Additional social history:  Please also refer to relevant sections of EMR.  Family History: Family History  Problem Relation Age of Onset  . Liver disease Father   . Hypertension Father   . Hypertension Sister      Allergies and Medications: No Known Allergies No current facility-administered medications on file prior to encounter.   Current Outpatient Medications on File Prior to Encounter  Medication Sig Dispense Refill  . acetaminophen (TYLENOL) 500 MG tablet Take 500-1,000 mg by mouth every 6 (six) hours as needed for mild pain.    . cholecalciferol (VITAMIN D3) 25 MCG (1000 UNIT) tablet Take 1,000 Units by mouth daily.    . Multiple Vitamin (MULTI-VITAMIN) tablet Take 1 tablet by mouth daily.    Marland Kitchen  Turmeric (QC TUMERIC COMPLEX) 500 MG CAPS Take by mouth.    . Black Pepper-Turmeric (TURMERIC COMPLEX/BLACK PEPPER) 3-500 MG CAPS Take 1 capsule by mouth daily. (Patient not taking: Reported on 11/09/2020)    . naproxen (NAPROSYN) 500 MG tablet Take 1 tablet (500 mg total) by mouth 2 (two) times daily with a meal. (Patient not taking: Reported on 11/09/2020) 30 tablet 1  . pantoprazole (PROTONIX) 40 MG tablet Take 1 tablet (40 mg total) by mouth daily. (Patient not taking: Reported on 11/09/2020) 30 tablet 0    Objective: BP (!) 150/82   Pulse 90   Temp 98.6 F (37 C)   Resp 14   SpO2 100%  Exam:  Physical Exam Constitutional:      General: She is not in acute distress.    Appearance: She is not ill-appearing.  HENT:     Head: Normocephalic and atraumatic.     Mouth/Throat:      Mouth: Mucous membranes are moist.     Pharynx: No oropharyngeal exudate or posterior oropharyngeal erythema.  Eyes:     Extraocular Movements: Extraocular movements intact.  Cardiovascular:     Rate and Rhythm: Normal rate and regular rhythm.     Pulses: Normal pulses.     Heart sounds: Normal heart sounds.  Pulmonary:     Effort: Pulmonary effort is normal.     Breath sounds: Normal breath sounds.  Abdominal:     General: Abdomen is flat. There is no distension.     Palpations: Abdomen is soft.     Tenderness: There is no abdominal tenderness.  Genitourinary:    Comments: Approximate 1 cm fluctuant boil actively draining purulent material present within the lateral right groin without any overlying erythema, with several centimeters of induration surrounding area.  Intermittent regions of scarring with previously well-healed surgical scars present. Musculoskeletal:     Right forearm: No swelling, edema, lacerations or tenderness.     Left forearm: Swelling, edema and tenderness present. No lacerations.     Right foot: Normal range of motion. Swelling and tenderness present.     Left foot: Normal.     Comments: L elbow/wrist/hand: Currently in sling.  Notable soft tissue edema present from left elbow into fingertips without any overlying erythema.  Tender to touch throughout this region.  Able to move fingertips/arms spontaneously, ROM limited due to pain.  Palpable radial pulses bilaterally.  R ankle: Nonpitting edema around ankle joint and dorsal aspect of foot without any overlying warmth to touch or erythema.  No LE edema above ankle joint.  Palpable dorsalis pedis pulse bilaterally.  Intact passive ROM with elicited pain.  Picture in media.  Skin:    General: Skin is warm.     Capillary Refill: Capillary refill takes less than 2 seconds.     Findings: No erythema, lesion or rash.  Neurological:     Mental Status: She is alert.     Labs and Imaging: CBC BMET  Recent Labs   Lab 11/09/20 1142  WBC 12.6*  HGB 9.0*  HCT 29.9*  PLT 336   Recent Labs  Lab 11/09/20 1142  NA 138  K 3.9  CL 107  CO2 21*  BUN 15  CREATININE 0.91  GLUCOSE 92  CALCIUM 8.8Delora Fuel, MD 11/10/2020, 12:59 AM PGY-1, [facility] pager: [phone], text pages welcome  FPTS Upper-Level Resident Addendum   I have independently interviewed and examined the patient.  I have discussed the above with the original author and agree with their documentation. My edits for correction/addition/clarification are in green. Please see also any attending notes.    Patriciaann Clan, DO  Family Medicine PGY-3

## 2020-11-10 NOTE — ED Notes (Signed)
Lunch Tray Ordered @ 1003. 

## 2020-11-10 NOTE — Discharge Summary (Addendum)
[facility] Discharge Summary  Patient name: Rachel Mckinney Medical record number: [id number] Date of birth: 1976/07/20 Age: 44 y.o. Gender: female Date of Admission: 11/09/2020  Date of Discharge: 11/10/2020 Admitting Physician: Delora Fuel, MD  Primary Care Provider: Patriciaann Clan, DO Consultants: Orthopedics  Indication for Hospitalization: Left arm swelling and pain  Discharge Diagnoses/Problem List:  Left Carpal & Radiocarpal synovitis, non-specific Left wrist tenosynovitis, non-specific Hidradenitis Suppurativa Hypertension Macrocytic Anemia  Disposition: Home  Discharge Condition: Stable  Discharge Exam:  Gen: alert, well-appearing, NAD HEENT: [place]/AT, moist mucous membranes, PERRL, EOMI CV: RRR, normal S1/S2 without m/r/g Pulm: normal WOB, lungs CTAB Abd: soft, nontender, nondistended GU: Approximate 1 cm fluctuant boil actively draining purulent material present within the lateral right groin without any overlying erythema, with several centimeters of induration surrounding area.  Intermittent regions of scarring with previously well-healed surgical scars present. MSK: Notable soft tissue edema present from left elbow into fingertips with increased warmth at wrist and without any overlying erythema. Tender to touch throughout this region.  Able to move fingertips/arms spontaneously, ROM limited due to pain.  Palpable radial pulses bilaterally. Picture in media tab. R ankle: Nonpitting edema around ankle joint and dorsal aspect of foot without any overlying warmth to touch or erythema.  No LE edema above ankle joint.  Palpable dorsalis pedis pulse bilaterally.  Intact passive ROM with elicited pain.  Picture in media tab.   Brief Hospital Course:  Rachel Mckinney is a 44 y.o. female who presented with persistent left arm pain/swelling x1 month. PMH is significant for hypertension and Hidradentits Suppurativa.  Left Arm Pain &  Swelling  Patient with 1 month of severe L arm pain and swelling, localized primarily to left wrist but also extending up to elbow. She was seen by rheumatology on 11/19 (at [facility]) who recommended hospitalization to rule out DVT and septic joint/osteomyelitis, and for possible joint tap.  Ultrasound was negative for DVT or superficial vein thrombosis. MRI revealed severe synovitis in the wrist and hand. No evidence of osteomyelitis. Overall imaging findings were suspicious for inflammatory vs. crystalline arthropathy. Both IR and ortho were consulted, but did not feel it was appropriate to pursue an arthrocentesis as there was no effusion to tap.  Inflammatory arthropathy thought to be less likely as patient has not responded to several courses of systemic steroids. Additionally, rheumatologic workup in 2019 was negative. Crystalline arthropathy possible but multi-joint involvement is atypical, uric acid was normal, and patient has not responded to NSAIDs. Of note, sed rate was elevated to 112 and CRP 19.8. Ultimately, patient discharged home despite unknown etiology. She was given Naproxen 500mg  BID and instructed to follow up with her rheumatologist on 12/6, who may attempt an arthrocentesis at that time. Rheumatology serology (Anti-DNA, C3, C4, CCP, RF) pending at the time of discharge.  Hidradenitis Suppurativa Patient has a long history of hidradenitis suppurativa followed by derm, plastic surgery, and OBGYN (s/p partial vulvectomy 02/2020). She was previously on Humira/Remicade until June 2021 for suppression. She did endorse an actively draining boil in her right groin, so CT was obtained which showed mild skin thickening possibly representing cellulitis vs. Fistula. Recommend follow up with derm and OBGYN as needed.   Macrocytic Anemia Patient's hemoglobin noted to be 8.5 with MCV 101.5. She was asymptomatic from the anemia. She did endorse a slowly oozing boil from her HS but no other  bleeding source. Folate and B12 levels were within normal limits. Consider repeat  CBC and iron studies as outpatient.  Issues for Follow Up:  Follow up on rheumatology labs (ANA, rheumatoid factor, complement levels, CCP, etc). Consider repeat CBC and iron studies HS management by derm, plastics, and OBGYN as needed Patient scheduled for follow-up with rheumatology on 12/6  Significant Procedures: None  Significant Labs and Imaging:  Recent Labs  Lab 11/09/20 1142 11/10/20 0655  WBC 12.6* 14.4*  HGB 9.0* 8.5*  HCT 29.9* 27.9*  PLT 336 328   Recent Labs  Lab 11/09/20 1142 11/10/20 0655  NA 138 135  K 3.9 3.8  CL 107 104  CO2 21* 22  GLUCOSE 92 108*  BUN 15 8  CREATININE 0.91 0.74  CALCIUM 8.8* 8.6*  ALKPHOS  --  89  AST  --  8*  ALT  --  12  ALBUMIN  --  2.6*    Results/Tests Pending at Time of Discharge:  Unresulted Labs (From admission, onward)            Start     Ordered   11/17/20 0500  Creatinine, serum  (enoxaparin (LOVENOX)    CrCl >/= 30 ml/min)  Weekly,   R     Comments: while on enoxaparin therapy    11/10/20 0228   11/10/20 1000  C4 complement  Once,   STAT        11/10/20 0245   11/10/20 1000  C3 complement  Once,   STAT        11/10/20 0245   19/62/22 9798  CYCLIC CITRUL PEPTIDE ANTIBODY, IGG/IGA  Once,   STAT        11/10/20 0245   11/10/20 1000  Rheumatoid factor  Once,   STAT        11/10/20 0245   11/10/20 1000  Anti-DNA antibody, double-stranded  Once,   STAT        11/10/20 0245   11/10/20 0244  Urinalysis, Routine w reflex microscopic  Once,   STAT        11/10/20 0245   11/10/20 0244  Protein / creatinine ratio, urine  Once,   STAT        11/10/20 0245            Discharge Medications:  Allergies as of 11/10/2020   No Known Allergies      Medication List     STOP taking these medications    acetaminophen 500 MG tablet Commonly known as: TYLENOL   Turmeric Complex/Black Pepper 3-500 MG Caps Generic drug: Black  Pepper-Turmeric       TAKE these medications    cholecalciferol 25 MCG (1000 UNIT) tablet Commonly known as: VITAMIN D3 Take 1,000 Units by mouth daily.   HYDROcodone-acetaminophen 5-325 MG tablet Commonly known as: NORCO/VICODIN Take 1 tablet by mouth every 6 hours as needed for severe pain.   Multi-Vitamin tablet Take 1 tablet by mouth daily.   naproxen 500 MG tablet Commonly known as: Naprosyn Take 1 tablet (500 mg total) by mouth 2 (two) times daily with a meal.   pantoprazole 40 MG tablet Commonly known as: PROTONIX Take 1 tablet (40 mg total) by mouth daily.   QC Tumeric Complex 500 MG Caps Generic drug: Turmeric Take by mouth.        Discharge Instructions: Please refer to Patient Instructions section of EMR for full details.  Patient was counseled important signs and symptoms that should prompt return to medical care, changes in medications, dietary instructions, activity restrictions, and follow up appointments.  Follow-Up Appointments:  November 29th at 3:10pm with Dr. Zettie Cooley (family medicine) December 6th at 11:00am with Dr. Wendall Mola (rheumatology)  Alcus Dad, MD 11/10/2020, 4:20 PM PGY-1, [place]

## 2020-11-11 LAB — CYCLIC CITRUL PEPTIDE ANTIBODY, IGG/IGA: CCP Antibodies IgG/IgA: 8 U (ref 0–19)

## 2020-11-11 LAB — RHEUMATOID FACTOR: Rheumatoid fact SerPl-aCnc: 17.4 [IU]/mL — ABNORMAL HIGH

## 2020-11-11 LAB — C4 COMPLEMENT: Complement C4, Body Fluid: 34 mg/dL (ref 12–38)

## 2020-11-11 LAB — C3 COMPLEMENT: C3 Complement: 182 mg/dL — ABNORMAL HIGH (ref 82–167)

## 2020-11-11 LAB — ANTI-DNA ANTIBODY, DOUBLE-STRANDED: ds DNA Ab: 2 IU/mL (ref 0–9)

## 2020-11-14 LAB — CULTURE, BLOOD (ROUTINE X 2)
Culture: NO GROWTH
Culture: NO GROWTH

## 2020-11-16 ENCOUNTER — Ambulatory Visit (INDEPENDENT_AMBULATORY_CARE_PROVIDER_SITE_OTHER): Payer: Self-pay | Admitting: Family Medicine

## 2020-11-16 ENCOUNTER — Encounter: Payer: Self-pay | Admitting: Family Medicine

## 2020-11-16 ENCOUNTER — Other Ambulatory Visit: Payer: Self-pay

## 2020-11-16 VITALS — BP 156/78 | HR 83 | Ht 65.0 in | Wt 220.2 lb

## 2020-11-16 DIAGNOSIS — M2550 Pain in unspecified joint: Secondary | ICD-10-CM

## 2020-11-16 DIAGNOSIS — Z791 Long term (current) use of non-steroidal anti-inflammatories (NSAID): Secondary | ICD-10-CM

## 2020-11-16 DIAGNOSIS — L732 Hidradenitis suppurativa: Secondary | ICD-10-CM

## 2020-11-16 MED ORDER — NAPROXEN 500 MG PO TABS
500.0000 mg | ORAL_TABLET | Freq: Two times a day (BID) | ORAL | 0 refills | Status: DC
Start: 2020-11-16 — End: 2020-12-19

## 2020-11-16 MED ORDER — TRAMADOL HCL 50 MG PO TABS
50.0000 mg | ORAL_TABLET | Freq: Three times a day (TID) | ORAL | 0 refills | Status: AC | PRN
Start: 2020-11-16 — End: 2020-11-21

## 2020-11-16 NOTE — Patient Instructions (Signed)
Checking labs today. I'll call you with any abnormal results.  I sent your medications to the pharmacy. Call if you need absolutely anything or have any questions or concerns.

## 2020-11-16 NOTE — Progress Notes (Signed)
   SUBJECTIVE:  CHIEF COMPLAINT / HPI:   Left arm swelling and pain follow up  Patient reports that she continues to have left arm swelling and pain.  She has been taking naproxen and Tylenol scheduled every 6 hours.  She denies any abdominal pain, dark stools, bloody emesis, dizziness or fast heartbeat.  She does report that she has had stiffness in her neck and in her shoulders which makes it hard for her to sleep at night.  She has plans to follow-up with the rheumatologist on 12/6 at 11 AM.   Hidradenitis Patient reports that she has had some draining boils in her right inguinal area as well as an area in her gluteal cleft.  She does report having increased pain in her gluteal cleft and would like [place] to look at it today to make sure she does not need any acute treatment.  PERTINENT  PMH / PSH: Severe hidradenitis  OBJECTIVE:  BP (!) 156/78   Pulse 83   Ht 5\' 5"  (1.651 m)   Wt 220 lb 3.2 oz (99.9 kg)   SpO2 100%   BMI 36.64 kg/m   General: Well-appearing, nontoxic. Upper extremity: Left upper extremity continues to be swollen with limited range of movement.  No significant change from hospital admission. GU/Rectal: Multiple active boils with areas of draining in obvious sinus tracts.  No severe erythema.  Tenderness to palpation of affected areas.  Patient has multiple active boils within her gluteal cleft as well.  ASSESSMENT/PLAN:  Hidradenitis suppurativa-chronic and involving multiple sites Patient has multiple active sites that are currently draining at this time.  There is no erythema surrounding the draining sites.  Placed sterile gauze and areas.  Patient has follow-up with dermatology in early December.  No need for antibiotics at this time.  Polyarthralgia Recently hospitalized for work-up.  Lab results significant for positive rheumatoid factor.  Patient to follow-up with her rheumatologist on 11/23/2020.  In the meantime, patient should continue pain control with naproxen  and Tylenol.  Patient denies any GI bleeding symptoms or ulcer symptoms at this time.  Will check BMP today to ensure kidney function stable.    Wilber Oliphant, MD [place]

## 2020-11-17 ENCOUNTER — Encounter: Payer: Self-pay | Admitting: Family Medicine

## 2020-11-17 LAB — BASIC METABOLIC PANEL
BUN/Creatinine Ratio: 12 (ref 9–23)
BUN: 9 mg/dL (ref 6–24)
CO2: 23 mmol/L (ref 20–29)
Calcium: 9.1 mg/dL (ref 8.7–10.2)
Chloride: 102 mmol/L (ref 96–106)
Creatinine, Ser: 0.78 mg/dL (ref 0.57–1.00)
GFR calc Af Amer: 106 mL/min/{1.73_m2} (ref >59)
GFR calc non Af Amer: 92 mL/min/{1.73_m2} (ref >59)
Glucose: 87 mg/dL (ref 65–99)
Potassium: 4.2 mmol/L (ref 3.5–5.2)
Sodium: 138 mmol/L (ref 134–144)

## 2020-11-17 NOTE — Assessment & Plan Note (Addendum)
Recently hospitalized for work-up.  Lab results significant for positive rheumatoid factor.  Patient to follow-up with her rheumatologist on 11/23/2020.  In the meantime, patient should continue pain control with naproxen and Tylenol.  Patient denies any GI bleeding symptoms or ulcer symptoms at this time.  Will check BMP today to ensure kidney function stable and repeat CBC

## 2020-11-17 NOTE — Assessment & Plan Note (Signed)
Patient has multiple active sites that are currently draining at this time.  There is no erythema surrounding the draining sites.  Placed sterile gauze and areas.  Patient has follow-up with dermatology in early December.  No need for antibiotics at this time.

## 2020-11-20 ENCOUNTER — Other Ambulatory Visit: Payer: Self-pay | Admitting: Family Medicine

## 2020-11-20 ENCOUNTER — Telehealth: Payer: Self-pay | Admitting: *Deleted

## 2020-11-20 DIAGNOSIS — B3731 Acute candidiasis of vulva and vagina: Secondary | ICD-10-CM

## 2020-11-20 DIAGNOSIS — M059 Rheumatoid arthritis with rheumatoid factor, unspecified: Secondary | ICD-10-CM | POA: Insufficient documentation

## 2020-11-20 DIAGNOSIS — M1990 Unspecified osteoarthritis, unspecified site: Secondary | ICD-10-CM | POA: Insufficient documentation

## 2020-11-20 DIAGNOSIS — M1380 Other specified arthritis, unspecified site: Secondary | ICD-10-CM | POA: Insufficient documentation

## 2020-11-20 MED ORDER — FLUCONAZOLE 150 MG PO TABS: 150.0000 mg | ORAL_TABLET | Freq: Every day | ORAL | 1 refills | Status: DC

## 2020-11-20 NOTE — Telephone Encounter (Signed)
Pt calls requesting a script to be called in for yeast.  She has an ithcy discharge and contributes it to all her medications she has been on recently due to her hydradenitis.   Will forward to PCP and provider who saw her last. Christen Bame, CMA

## 2020-11-20 NOTE — Telephone Encounter (Signed)
Informed patient of RX at pharmacy and advice form Dr. Higinio Plan.  Rachel Mckinney, [name]

## 2020-11-20 NOTE — Telephone Encounter (Signed)
Rx'd Diflucan to [facility]. If her symptoms are not improving following this, please have her schedule an appointment for further evaluation.   Patriciaann Clan, DO

## 2020-12-17 ENCOUNTER — Other Ambulatory Visit: Payer: Self-pay | Admitting: Family Medicine

## 2021-01-13 ENCOUNTER — Encounter: Payer: Self-pay | Admitting: Family Medicine

## 2021-01-13 ENCOUNTER — Other Ambulatory Visit: Payer: Self-pay

## 2021-01-13 ENCOUNTER — Ambulatory Visit (INDEPENDENT_AMBULATORY_CARE_PROVIDER_SITE_OTHER): Payer: Self-pay | Admitting: Family Medicine

## 2021-01-13 VITALS — BP 160/88 | HR 85 | Wt 229.0 lb

## 2021-01-13 DIAGNOSIS — L732 Hidradenitis suppurativa: Secondary | ICD-10-CM

## 2021-01-13 DIAGNOSIS — M25522 Pain in left elbow: Secondary | ICD-10-CM

## 2021-01-13 DIAGNOSIS — I10 Essential (primary) hypertension: Secondary | ICD-10-CM

## 2021-01-13 DIAGNOSIS — F325 Major depressive disorder, single episode, in full remission: Secondary | ICD-10-CM

## 2021-01-13 DIAGNOSIS — F322 Major depressive disorder, single episode, severe without psychotic features: Secondary | ICD-10-CM

## 2021-01-13 DIAGNOSIS — M65832 Other synovitis and tenosynovitis, left forearm: Secondary | ICD-10-CM

## 2021-01-13 MED ORDER — SERTRALINE HCL 50 MG PO TABS: 50.0000 mg | ORAL_TABLET | Freq: Every day | ORAL | 1 refills | Status: DC

## 2021-01-13 MED ORDER — NAPROXEN 500 MG PO TABS: 500.0000 mg | ORAL_TABLET | Freq: Two times a day (BID) | ORAL | 0 refills | Status: DC | PRN

## 2021-01-13 MED ORDER — DOXYCYCLINE HYCLATE 100 MG PO TABS: 100.0000 mg | ORAL_TABLET | Freq: Every day | ORAL | 0 refills | Status: DC

## 2021-01-13 NOTE — Assessment & Plan Note (Signed)
Tenosynovitis present in left upper extremity since mid 2021 still present and contributing to worsening depression as above.  Rheumatological work-up positive only for RF thus far, previously negative for DVT and unlikely infectious given chronicity.  Follow-up with rheumatology on 2/11 after new insurance, will also place referral for PM&R to further assess for any additional interventions.  Refilled naproxen for pain relief.

## 2021-01-13 NOTE — Progress Notes (Signed)
SUBJECTIVE:   CHIEF COMPLAINT / HPI: F/u depression  Rachel Mckinney is a 45 year old female presenting for evaluation of worsening depression.  She reports over the past few months her depression has significantly worsened in the setting of medical problems.  Her mom recommended she seek evaluation to discuss options.  She now has a chronic history of left carpal/wrist tenosynovitis with extensive work-up during hospitalization 10/2020 that has continued to be debilitating for her.  She has had no insurance and thus has not been able to seeing her rheumatologist after hospital discharge.  Her insurance kicks in on 2/1 and will have follow-up with rheumatology on 2/11.  She also has severe hidradenitis suppurativa and has not been able to see her dermatologist.  Constantly has several boils draining in her pelvic area, limiting her sexual life.  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  She is very tearful that she has gained weight due to eating so much and is not sleeping well.  She is not currently on any medications but we will willing to try something in addition to therapy.  She is occasionally thought that would be better if she did not wake up the next day however has never had thoughts of causing harm to herself.  No plan or intent.  Has good support through her son and her mother.    She frequently states " I do not want to keep existing, I want to live and have a purpose."   [place] Visit from 01/13/2021 in [place] Office Visit from 11/16/2020 in [place] Office Visit from 10/19/2020 in [place]  Thoughts that you would be better off dead, or of hurting yourself in some way Several days Not at all Nearly every day  PHQ-9 Total Score 23 14 20       PERTINENT  PMH / PSH: Hidradenitis suppurativa, macrocytic anemia, hypertension, left carpal/radiocarpal/wrist  synovitis that has been nonspecific in nature with a positive RF.  OBJECTIVE:   BP (!) 160/88   Pulse 85   Wt 229 lb (103.9 kg)   SpO2 99%   BMI 38.11 kg/m   General: Alert, NAD HEENT: NCAT, MMM Msk: Moves all extremities spontaneously  Ext: Warm, dry, 2+ distal pulses, left wrist/hand with notable soft tissue edema present mainly from the fingertips to mid forearm without any overlying erythema.  Tender to palpation of hand.  Able to move fingertips/arms spontaneously however with limited ROM. Pelvic: Several small boils in bilateral groin draining with what appears to be superficial tracts, no surrounding swelling, erythema, warmth to touch.  Intermittent regions of scarring present throughout bilateral groin. Psych: Depressed mood and affect, tearful frequently during conversation.  Good eye contact.  Nontangential.  ASSESSMENT/PLAN:   Depression, major, single episode, severe (HCC) PHQ score of 23, with intermittent passive SI, no plan or intent present, in the setting of multiple medical stressors.  Provided supportive listening for the majority of the visit.  Rx for Zoloft 50 mg, may increase to 100 mg after 1 week.  Provided therapy resources, aware of crisis hotline.  Will work towards treatment of medical conditions as below.  Follow-up in 2 weeks or sooner if needed.  Hidradenitis suppurativa-chronic and involving multiple sites Uncontrolled.  Extensive previously on Remicade/Humira through dermatologist with numerous surgical repairs.  While she has minimal insurance now, Rx'd suppressive doxycycline 100 mg daily until she  can follow-up with dermatology in the next 1-2 months to discuss long-term treatment.  Extensor tenosynovitis of left wrist Tenosynovitis present in left upper extremity since mid 2021 still present and contributing to worsening depression as above.  Rheumatological work-up positive only for RF thus far, previously negative for DVT and unlikely infectious given  chronicity.  Follow-up with rheumatology on 2/11 after new insurance, will also place referral for PM&R to further assess for any additional interventions.  Refilled naproxen for pain relief.  HTN (hypertension) Uncontrolled, given significant discussion as above will discuss on future visits.  Concurrent pain may also be contributing.    Follow-up in approximately 2 weeks for above or sooner if needed.   Patriciaann Clan, [place]

## 2021-01-13 NOTE — Patient Instructions (Signed)
It was wonderful to see you again today, I am so sorry that you continue to struggle and hopefully we can help this in the next several weeks.  We can start a medication called Zoloft, you can start with 50 mg daily and then if tolerating this well after 1 week can increase it to 100 mg.  I am also going to send in an antibiotic called doxycycline, you will take this once daily as well to help with your hidradenitis.  For sleep you can try melatonin 2 hours prior to bedtime.  Could you also try lavender baths or lotion as well.  If this is not helping in the next 2 weeks, please let me know and we can try an additional medication.   Therapy and Counseling Resources Most providers on this list will take Medicaid. Patients with commercial insurance or Medicare should contact their insurance company to get a list of in network providers.  [facility]:Psychiatry and Counseling [address] [facility] [address] [place], [place] [address] [place]  [address], [place], [place] [address]      [place] [address]  [place], [place] [address] [phone]  [place] [address], [place]  [place], [place] [address]       [phone]      Jinny Blossom [facility] [address], [place], [place]  Family Solutions:  [place]. [place] [phone]  [facility]:  [place] [address] [place] [phone]  [facility] (under & uninsured) [address] [place] [phone]    [email or web address]    [place] [address] . [place]    [phone]  [facility] [place]     Phone:  [phone]     [place]  [place] [address]      [place], [place] ext [place]: [place], [place], [place]   [place] ([place] therapist) [email or web address]  [place] [address]   [place] [address]    [phone]    [facility]   [address],  [place], [place]   [place]  [place]  [facility]  [address] [place], [place]        [phone]  Open Access/Walk In Clinic under & uninsured  [facility]  [address] [place], [place] Crisis [phone]  [facility] [place],  ([place])   [place], [place]: [phone]) 8:30 - 12; 1 - 2:30  [facility] of the [place],  [place], [place]    [phone]):8:30 - 12; 2 - 3PM  [facility] [employer],  [address]  [place]; [phone]):   Mon - Fri 8 AM - 5 PM  [facility] [place]  MWF 12:30 to 3:00 or call to schedule an appointment  [phone]  Specific Provider options Psychology Today  [email or web address] 1. click on find a therapist  2. enter your zip code 3. left side and select or tailor a therapist for your specific need.  [facility] Provider Directory [email or web address]  (Medicaid)   Follow all drop down to find a provider  [place] [phone] or [email or web address] [address], [place], [place] Recovery support and educational   24- Hour Availability:  .  [place] [facility]  . [place], [place] Crisis [phone]  . [facility] of the [employer] [phone]  [facility] Crisis Service  [phone]   . [place]  [phone] (after hours)  . Therapeutic  Alternative/Mobile Crisis   [phone]  . [place] National Suicide Hotline  410-374-6630 ([place])  . Call 911 or go to emergency room  . [employer]  [phone]);  [place] and [employer]   . [facility] ACCESS  [phone]); [place], [place], [place], [place], [place], [place], [place]

## 2021-01-13 NOTE — Assessment & Plan Note (Signed)
Uncontrolled, given significant discussion as above will discuss on future visits.  Concurrent pain may also be contributing.

## 2021-01-13 NOTE — Assessment & Plan Note (Signed)
PHQ score of 23, with intermittent passive SI, no plan or intent present, in the setting of multiple medical stressors.  Provided supportive listening for the majority of the visit.  Rx for Zoloft 50 mg, may increase to 100 mg after 1 week.  Provided therapy resources, aware of crisis hotline.  Will work towards treatment of medical conditions as below.  Follow-up in 2 weeks or sooner if needed.

## 2021-01-13 NOTE — Assessment & Plan Note (Signed)
Uncontrolled.  Extensive previously on Remicade/Humira through dermatologist with numerous surgical repairs.  While she has minimal insurance now, Rx'd suppressive doxycycline 100 mg daily until she can follow-up with dermatology in the next 1-2 months to discuss long-term treatment.

## 2021-01-29 IMAGING — CR DG FOOT COMPLETE 3+V*R*
3 series · 3 of 3 positions shown · non-contrast
Comparison: No prior.

CLINICAL DATA: Swelling.  No injury.  Part arthralgia.

EXAM:
RIGHT FOOT COMPLETE - 3+ VIEW

[foot ap]
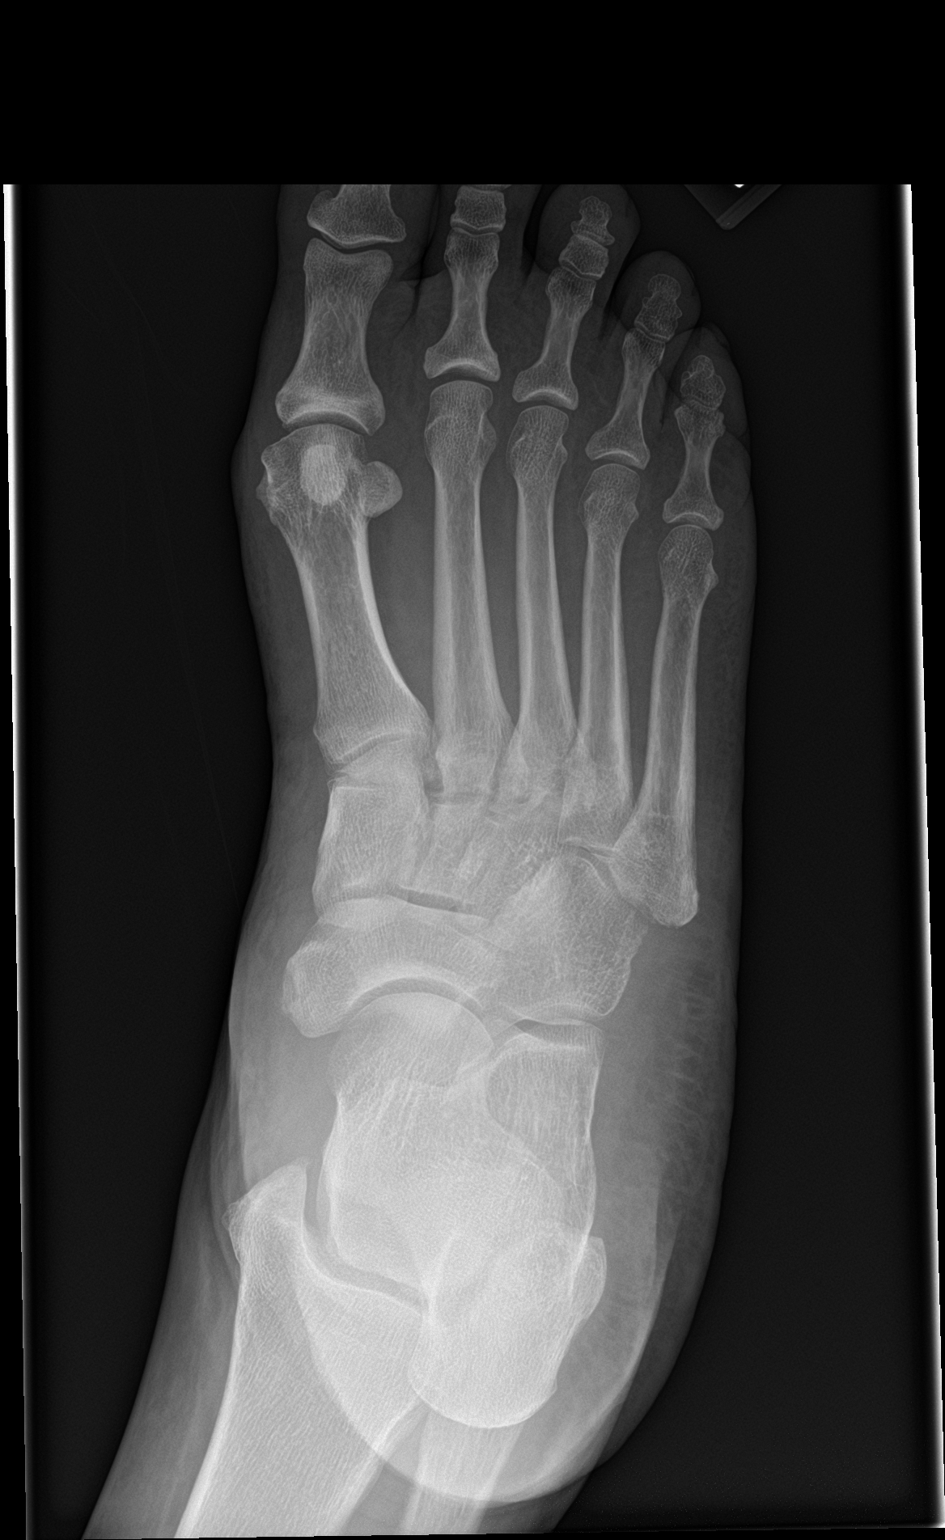

[foot obl]
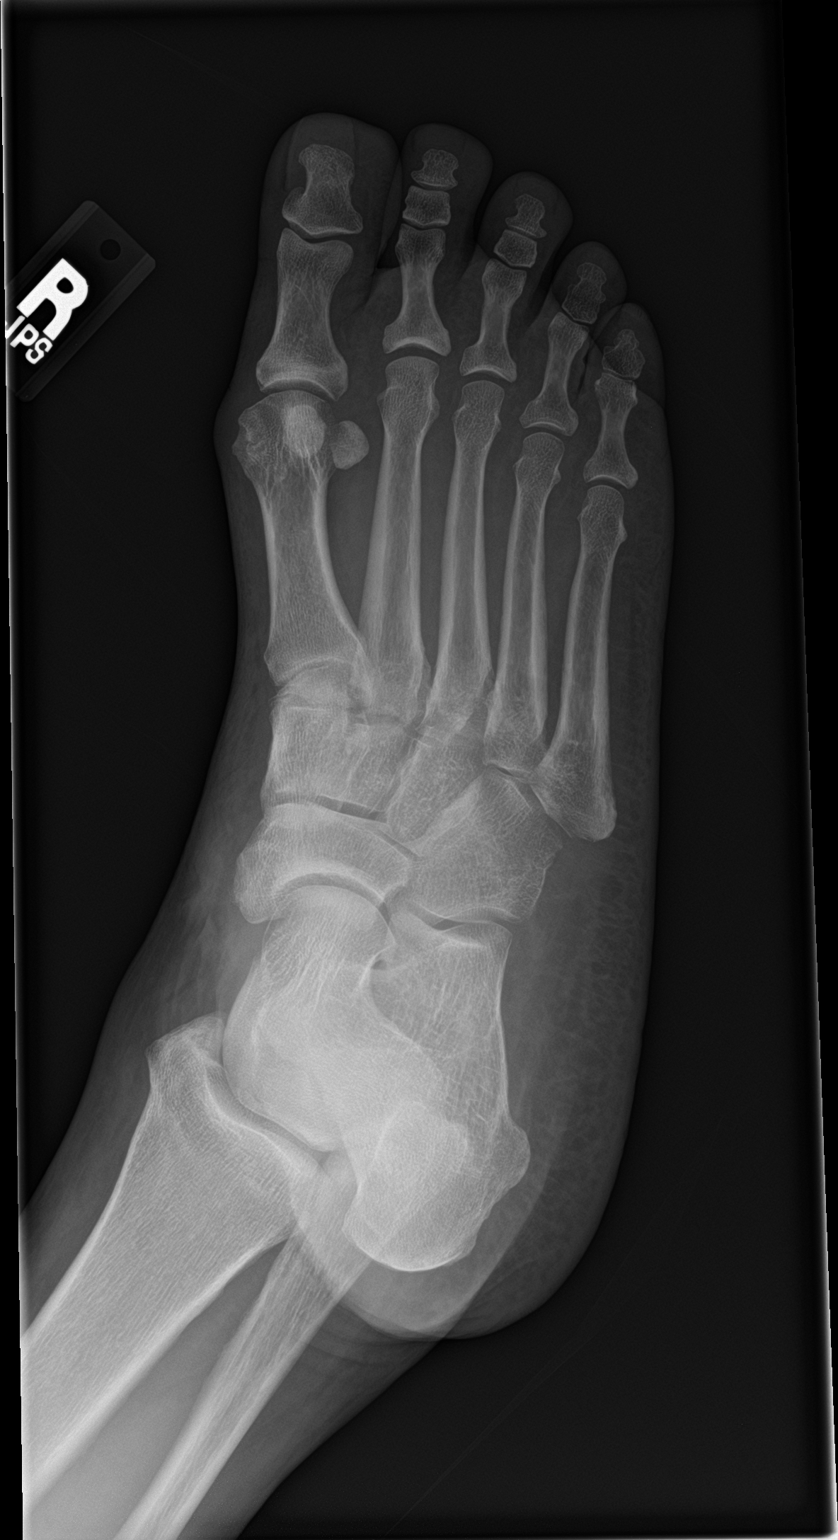

[foot lat]
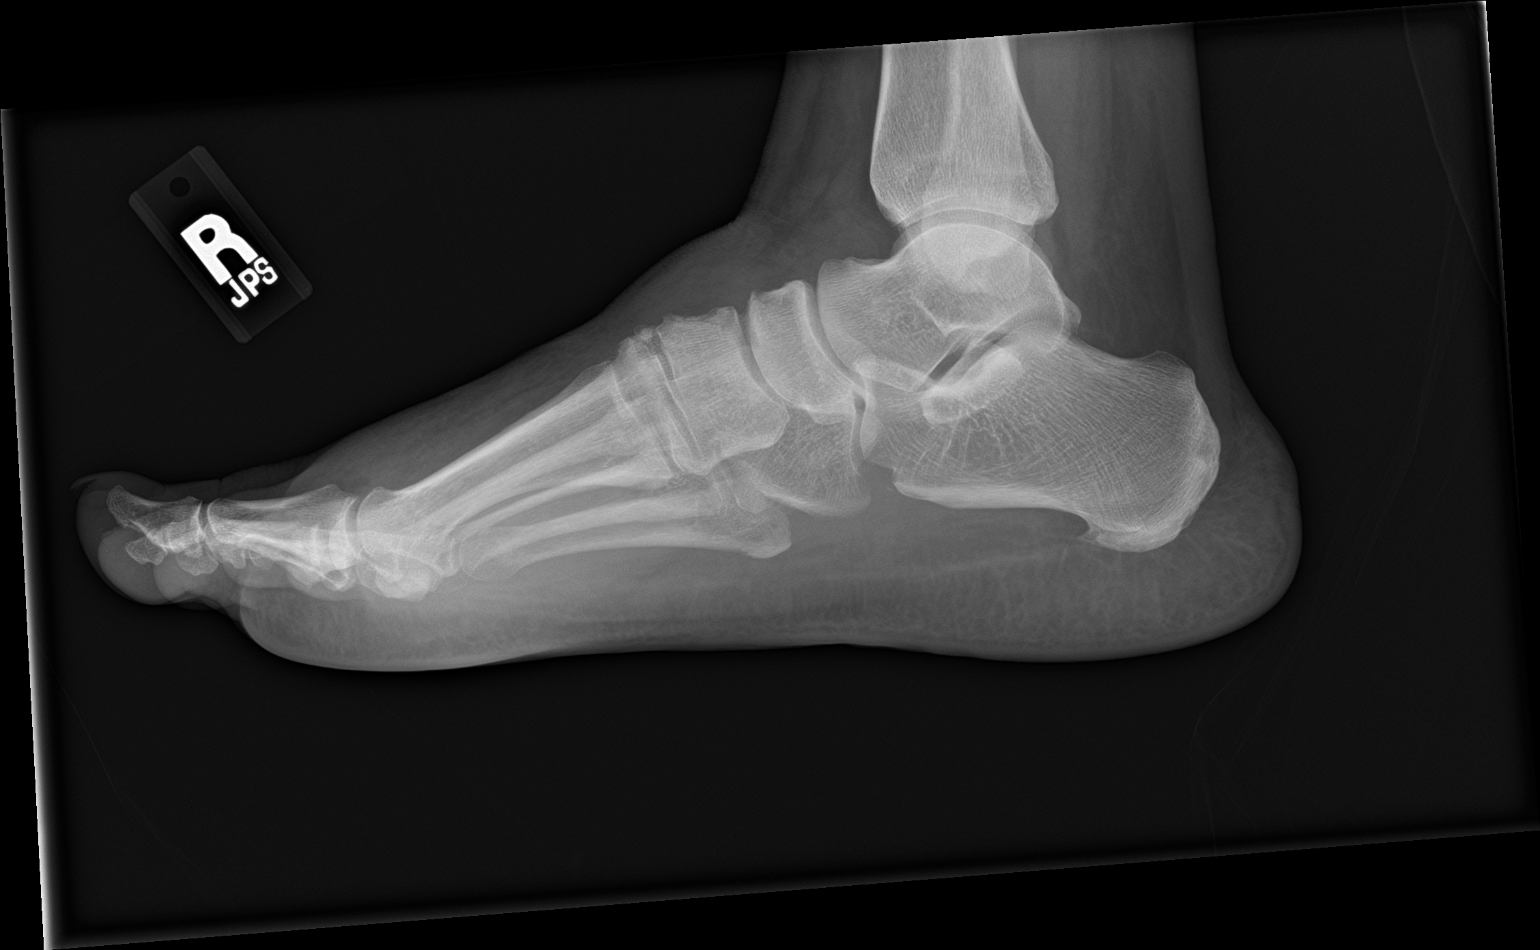

[3 of 3 positions shown; findings below may reference images not displayed]

FINDINGS: Degenerative change first MTP joint. No evidence of erosive
arthropathy. Subtle lucency noted the base of the right fifth
metatarsal most likely secondary to incompletely fused secondary
ossification center. No acute bony or joint abnormality otherwise
noted.
IMPRESSION: 1. Degenerative change first MTP joint. No evidence of erosive
arthropathy.
2. No acute abnormality identified.

## 2021-02-23 ENCOUNTER — Encounter: Payer: Self-pay | Admitting: Family Medicine

## 2021-02-23 ENCOUNTER — Ambulatory Visit (INDEPENDENT_AMBULATORY_CARE_PROVIDER_SITE_OTHER): Payer: Self-pay | Admitting: Family Medicine

## 2021-02-23 ENCOUNTER — Other Ambulatory Visit: Payer: Self-pay

## 2021-02-23 VITALS — BP 180/94 | HR 83 | Ht 65.0 in | Wt 234.6 lb

## 2021-02-23 DIAGNOSIS — I10 Essential (primary) hypertension: Secondary | ICD-10-CM

## 2021-02-23 MED ORDER — SPIRONOLACTONE 25 MG PO TABS: 25.0000 mg | ORAL_TABLET | Freq: Every day | ORAL | 1 refills | Status: DC

## 2021-02-23 NOTE — Patient Instructions (Signed)
[email or web address]">  DASH Eating Plan DASH stands for Dietary Approaches to Stop Hypertension. The DASH eating plan is a healthy eating plan that has been shown to:  Reduce high blood pressure (hypertension).  Reduce your risk for type 2 diabetes, heart disease, and stroke.  Help with weight loss. What are tips for following this plan? Reading food labels  Check food labels for the amount of salt (sodium) per serving. Choose foods with less than 5 percent of the Daily Value of sodium. Generally, foods with less than 300 milligrams (mg) of sodium per serving fit into this eating plan.  To find whole grains, look for the word "whole" as the first word in the ingredient list. Shopping  Buy products labeled as "low-sodium" or "no salt added."  Buy fresh foods. Avoid canned foods and pre-made or frozen meals.  You CAN purchase frozen vegetables Cooking  Avoid adding salt when cooking. Use salt-free seasonings or herbs instead of table salt or sea salt. Check with your health care provider or pharmacist before using salt substitutes - Mrs. Deliah Boston is fine!  Do not fry foods. Cook foods using healthy methods such as baking, boiling, grilling, roasting, and broiling instead.  Cook with heart-healthy oils, such as olive, canola, avocado, or sunflower oil. Meal planning  Eat a balanced diet that includes: ? 4 or more servings of fruits and 4 or more servings of vegetables each day. Try to fill one-half of your plate with fruits and vegetables. ? 3 servings of whole grains each day. ? Less than 6 oz (170 g) of lean meat, poultry, or fish each day. A 3-oz (85-g) serving of meat is about the same size as a deck of cards. One egg equals 1 oz (28 g). ? 2-3 servings of low-fat dairy each day. One serving is 1 cup (237 mL). ? 1 serving of nuts, seeds, or beans 5 times each week. ? 2-3 servings of heart-healthy fats. Healthy fats called omega-3 fatty  acids are found in foods such as walnuts, flaxseeds, fortified milks, and eggs. These fats are also found in cold-water fish, such as sardines, salmon, and mackerel.  Limit how much you eat of: ? Canned or prepackaged foods. ? Food that is high in trans fat, such as some fried foods. ? Food that is high in saturated fat, such as fatty meat. ? Desserts and other sweets, sugary drinks, and other foods with added sugar. ? Full-fat dairy products.  Do not salt foods before eating.  Do not eat more than 4 egg yolks a week.  Try to eat at least 2 vegetarian meals a week.  Eat more home-cooked food and less restaurant, buffet, and fast food.   Lifestyle  When eating at a restaurant, ask that your food be prepared with less salt or no salt, if possible.  If you drink alcohol: ? Limit how much you use to:  0-1 drink a day for women who are not pregnant.  0-2 drinks a day for men. ? Be aware of how much alcohol is in your drink. In the [place] one drink equals one 12 oz bottle of beer (355 mL), one 5 oz glass of wine (148 mL), or one 1 oz glass of hard liquor (44 mL). General information  Avoid eating more than 2,300 mg of salt a day. If you have hypertension, you may need to reduce your sodium intake to 1,500 mg a day.  Work with your health care provider to maintain a healthy body weight or  to lose weight. Ask what an ideal weight is for you.  Get at least 30 minutes of exercise that causes your heart to beat faster (aerobic exercise) most days of the week. Activities may include walking, swimming, or biking.  Work with your health care provider or dietitian to adjust your eating plan to your individual calorie needs. What foods should I eat? Fruits All fresh, dried, or frozen fruit. Canned fruit in natural juice (without added sugar). Vegetables Fresh or frozen vegetables (raw, steamed, roasted, or grilled). Low-sodium or reduced-sodium tomato and vegetable juice. Low-sodium or  reduced-sodium tomato sauce and tomato paste. Low-sodium or reduced-sodium canned vegetables. Grains Whole-grain or whole-wheat bread. Whole-grain or whole-wheat pasta. Brown rice. [place]row. Bulgur. Whole-grain and low-sodium cereals. Pita bread. Low-fat, low-sodium crackers. Whole-wheat flour tortillas. Meats and other proteins Skinless chicken or [place]. Ground chicken or [place]. Pork with fat trimmed off. Fish and seafood. Egg whites. Dried beans, peas, or lentils. Unsalted nuts, nut butters, and seeds. Unsalted canned beans. Lean cuts of beef with fat trimmed off. Low-sodium, lean precooked or cured meat, such as sausages or meat loaves. Dairy Low-fat (1%) or fat-free (skim) milk. Reduced-fat, low-fat, or fat-free cheeses. Nonfat, low-sodium ricotta or cottage cheese. Low-fat or nonfat yogurt. Low-fat, low-sodium cheese. Fats and oils Soft margarine without trans fats. Vegetable oil. Reduced-fat, low-fat, or light mayonnaise and salad dressings (reduced-sodium). Canola, safflower, olive, avocado, soybean, and sunflower oils. Avocado. Seasonings and condiments Herbs. Spices. Seasoning mixes without salt. Other foods Unsalted popcorn and pretzels. Fat-free sweets. The items listed above may not be a complete list of foods and beverages you can eat. Contact a dietitian for more information. What foods should I avoid? Fruits Canned fruit in a light or heavy syrup. Fried fruit. Fruit in cream or butter sauce. Vegetables Creamed or fried vegetables. Vegetables in a cheese sauce. Regular canned vegetables (not low-sodium or reduced-sodium). Regular canned tomato sauce and paste (not low-sodium or reduced-sodium). Regular tomato and vegetable juice (not low-sodium or reduced-sodium). Angie Fava. Olives. Grains Baked goods made with fat, such as croissants, muffins, or some breads. Dry pasta or rice meal packs. Meats and other proteins Fatty cuts of meat. Ribs. Fried meat. Berniece Salines. Bologna,  salami, and other precooked or cured meats, such as sausages or meat loaves. Fat from the back of a pig (fatback). Bratwurst. Salted nuts and seeds. Canned beans with added salt. Canned or smoked fish. Whole eggs or egg yolks. Chicken or [place] with skin. Dairy Whole or 2% milk, cream, and half-and-half. Whole or full-fat cream cheese. Whole-fat or sweetened yogurt. Full-fat cheese. Nondairy creamers. Whipped toppings. Processed cheese and cheese spreads. Fats and oils Butter. Stick margarine. Lard. Shortening. Ghee. Bacon fat. Tropical oils, such as coconut, palm kernel, or palm oil. Seasonings and condiments Onion salt, garlic salt, seasoned salt, table salt, and sea salt. Worcestershire sauce. Tartar sauce. Barbecue sauce. Teriyaki sauce. Soy sauce, including reduced-sodium. Steak sauce. Canned and packaged gravies. Fish sauce. Oyster sauce. Cocktail sauce. Store-bought horseradish. Ketchup. Mustard. Meat flavorings and tenderizers. Bouillon cubes. Hot sauces. Pre-made or packaged marinades. Pre-made or packaged taco seasonings. Relishes. Regular salad dressings. Other foods Salted popcorn and pretzels. The items listed above may not be a complete list of foods and beverages you should avoid. Contact a dietitian for more information. Where to find more information  National Heart, Lung, and Blood Institute: https://wilson-eaton.com/  American Heart Association: www.heart.org  Academy of Nutrition and Dietetics: www.eatright.[place]: www.kidney.org Summary  The DASH eating plan  is a healthy eating plan that has been shown to reduce high blood pressure (hypertension). It may also reduce your risk for type 2 diabetes, heart disease, and stroke.  When on the DASH eating plan, aim to eat more fresh fruits and vegetables, whole grains, lean proteins, low-fat dairy, and heart-healthy fats.  With the DASH eating plan, you should limit salt (sodium) intake to 2,300 mg a day. If you  have hypertension, you may need to reduce your sodium intake to 1,500 mg a day.  Work with your health care provider or dietitian to adjust your eating plan to your individual calorie needs. This information is not intended to replace advice given to you by your health care provider. Make sure you discuss any questions you have with your health care provider. Document Revised: 11/08/2019 Document Reviewed: 11/08/2019 Elsevier Patient Education  2021 [employer].

## 2021-02-23 NOTE — Progress Notes (Signed)
    SUBJECTIVE:   CHIEF COMPLAINT / HPI:   Check BP: Today is 178/87. She left the office on March 3rd and checked BP at home. BPs have been 160/103, 203/108, 175/97, 165/97, 167/97, 157/56, 148/87. It has been up and down in the past. Used to be on Spironolactone 50 mg up to 100 mg to also help treat her hidradenitis suppurativa; however has not been on it in 1 year.  BMP in November 2021 was normal and without evidence of chronic kidney disease.  Today she denies headaches, vision changes, shortness of breath, chest pain.   PERTINENT  PMH / PSH:  History of tobacco use, hypertension, MDD, polyarthralgia, BMI 39  OBJECTIVE:   BP (!) 180/94   Pulse 83   Ht 5\' 5"  (1.651 m)   Wt 234 lb 9.6 oz (106.4 kg)   SpO2 94%   BMI 39.04 kg/m    Physical exam: General: Well-appearing patient, no apparent distress Respiratory: Comfortable work of breathing on room air Cardio: RRR, S1-S2 present, no murmurs appreciated  ASSESSMENT/PLAN:   HTN (hypertension) Patient's blood pressure today in 409B systolic. She reports they are usually better controlled at home (patient bought her own blood pressure cuff), however she has not been taking the blood pressures properly after being seated for 5 minutes, arm at level of heart, etc.  She did not bring her blood pressure cuff today for calibration.  Is asymptomatic.  Due to patient's history of hidradenitis and benefit of spironolactone it was discussed to restart her on this; however, she will most likely need a second agent. -Patient prescribed spironolactone 25 mg daily -Asked to follow-up in 1-2 weeks for repeat blood pressure check and BMP     Rachel Mckinney

## 2021-02-27 NOTE — Assessment & Plan Note (Signed)
Patient's blood pressure today in 767W systolic. She reports they are usually better controlled at home (patient bought her own blood pressure cuff), however she has not been taking the blood pressures properly after being seated for 5 minutes, arm at level of heart, etc.  She did not bring her blood pressure cuff today for calibration.  Is asymptomatic.  Due to patient's history of hidradenitis and benefit of spironolactone it was discussed to restart her on this; however, she will most likely need a second agent. -Patient prescribed spironolactone 25 mg daily -Asked to follow-up in 1-2 weeks for repeat blood pressure check and BMP

## 2021-03-09 ENCOUNTER — Ambulatory Visit (INDEPENDENT_AMBULATORY_CARE_PROVIDER_SITE_OTHER): Payer: Medicaid Other | Admitting: Family Medicine

## 2021-03-09 DIAGNOSIS — Z5329 Procedure and treatment not carried out because of patient's decision for other reasons: Secondary | ICD-10-CM

## 2021-03-09 DIAGNOSIS — I10 Essential (primary) hypertension: Secondary | ICD-10-CM

## 2021-03-09 DIAGNOSIS — Z91199 Patient's noncompliance with other medical treatment and regimen due to unspecified reason: Secondary | ICD-10-CM | POA: Insufficient documentation

## 2021-03-09 NOTE — Progress Notes (Signed)
Patient was scheduled for appointment today 03/09/2021 to follow-up for her blood pressure after being started on spironolactone.  Patient was scheduled to have BMP checked.  Patient did not show up for her appointment.  Milus Banister, [place], PGY-3 03/09/2021 1:08 PM

## 2021-03-12 ENCOUNTER — Ambulatory Visit (INDEPENDENT_AMBULATORY_CARE_PROVIDER_SITE_OTHER): Payer: Self-pay | Admitting: Family Medicine

## 2021-03-12 ENCOUNTER — Encounter: Payer: Self-pay | Admitting: Family Medicine

## 2021-03-12 ENCOUNTER — Other Ambulatory Visit: Payer: Self-pay

## 2021-03-12 VITALS — BP 150/70 | HR 86 | Ht 65.0 in | Wt 230.6 lb

## 2021-03-12 DIAGNOSIS — D649 Anemia, unspecified: Secondary | ICD-10-CM | POA: Insufficient documentation

## 2021-03-12 DIAGNOSIS — D539 Nutritional anemia, unspecified: Secondary | ICD-10-CM

## 2021-03-12 DIAGNOSIS — I10 Essential (primary) hypertension: Secondary | ICD-10-CM

## 2021-03-12 MED ORDER — HYDROCHLOROTHIAZIDE 25 MG PO TABS: 25.0000 mg | ORAL_TABLET | Freq: Every day | ORAL | 0 refills | Status: DC

## 2021-03-12 NOTE — Patient Instructions (Signed)
[employer] medicine (25th ed., pp. [phone]). [place], [place]: Elsevier.">  Anemia  Anemia is a condition in which there is not enough red blood cells or hemoglobin in the blood. Hemoglobin is a substance in red blood cells that carries oxygen. When you do not have enough red blood cells or hemoglobin (are anemic), your body cannot get enough oxygen and your organs may not work properly. As a result, you may feel very tired or have other problems. What are the causes? Common causes of anemia include:  Excessive bleeding. Anemia can be caused by excessive bleeding inside or outside the body, including bleeding from the intestines or from heavy menstrual periods in females.  Poor nutrition.  Long-lasting (chronic) kidney, thyroid, and liver disease.  Bone marrow disorders, spleen problems, and blood disorders.  Cancer and treatments for cancer.  HIV (human immunodeficiency virus) and AIDS (acquired immunodeficiency syndrome).  Infections, medicines, and autoimmune disorders that destroy red blood cells. What are the signs or symptoms? Symptoms of this condition include:  Minor weakness.  Dizziness.  Headache, or difficulties concentrating and sleeping.  Heartbeats that feel irregular or faster than normal (palpitations).  Shortness of breath, especially with exercise.  Pale skin, lips, and nails, or cold hands and feet.  Indigestion and nausea. Symptoms may occur suddenly or develop slowly. If your anemia is mild, you may not have symptoms. How is this diagnosed? This condition is diagnosed based on blood tests, your medical history, and a physical exam. In some cases, a test may be needed in which cells are removed from the soft tissue inside of a bone and looked at under a microscope (bone marrow biopsy). Your health care provider may also check your stool (feces) for blood and may do additional testing to look for the cause of your bleeding. Other tests may  include:  Imaging tests, such as a CT scan or MRI.  A procedure to see inside your esophagus and stomach (endoscopy).  A procedure to see inside your colon and rectum (colonoscopy). How is this treated? Treatment for this condition depends on the cause. If you continue to lose a lot of blood, you may need to be treated at a hospital. Treatment may include:  Taking supplements of iron, vitamin Q68, or folic acid.  Taking a hormone medicine (erythropoietin) that can help to stimulate red blood cell growth.  Having a blood transfusion. This may be needed if you lose a lot of blood.  Making changes to your diet.  Having surgery to remove your spleen. Follow these instructions at home:  Take over-the-counter and prescription medicines only as told by your health care provider.  Take supplements only as told by your health care provider.  Follow any diet instructions that you were given by your health care provider.  Keep all follow-up visits as told by your health care provider. This is important. Contact a health care provider if:  You develop new bleeding anywhere in the body. Get help right away if:  You are very weak.  You are short of breath.  You have pain in your abdomen or chest.  You are dizzy or feel faint.  You have trouble concentrating.  You have bloody stools, black stools, or tarry stools.  You vomit repeatedly or you vomit up blood. These symptoms may represent a serious problem that is an emergency. Do not wait to see if the symptoms will go away. Get medical help right away. Call your local emergency services (911 in the [place] Do not  drive yourself to the hospital. Summary  Anemia is a condition in which you do not have enough red blood cells or enough of a substance in your red blood cells that carries oxygen (hemoglobin).  Symptoms may occur suddenly or develop slowly.  If your anemia is mild, you may not have symptoms.  This condition is  diagnosed with blood tests, a medical history, and a physical exam. Other tests may be needed.  Treatment for this condition depends on the cause of the anemia. This information is not intended to replace advice given to you by your health care provider. Make sure you discuss any questions you have with your health care provider. Document Revised: 11/12/2019 Document Reviewed: 11/12/2019 Elsevier Patient Education  2021 Elsevier Inc.  

## 2021-03-12 NOTE — Progress Notes (Signed)
    SUBJECTIVE:   CHIEF COMPLAINT / HPI:   HTN:  The patient was recently started on Aldactone(Spironolactone) a few weeks ago. However, she self d/ced this medication three days ago. She mentioned that she reacted to this medication in the past by an overgrowth of her hidradenitis. She requested a different regimen. She had been on HCTZ and Norvasc in the past. She is ok with trying either of these medications. She recall her HCTZ was d/ced when she was started on Aldactone for BP and Hidradenitis.  Fatigue: She asked about her hemoglobin level. Hx of anemia in the past. C/O chronic fatigue which she thought was due to her rheumatoid arthritis. However, she is adequately treated by her rheumatologist. She worries, this might be due to her anemia.   PERTINENT  PMH / PSH: PMX reviewed.  OBJECTIVE:   BP (!) 150/70   Pulse 86   Ht 5\' 5"  (1.651 m)   Wt 230 lb 9.6 oz (104.6 kg)   SpO2 96%   BMI 38.37 kg/m   Physical Exam Vitals and nursing note reviewed.  Cardiovascular:     Rate and Rhythm: Normal rate and regular rhythm.     Heart sounds: Normal heart sounds. No murmur heard.   Pulmonary:     Effort: Pulmonary effort is normal. No respiratory distress.     Breath sounds: Normal breath sounds. No wheezing.  Abdominal:     General: Bowel sounds are normal.  Musculoskeletal:     Right lower leg: No edema.     Left lower leg: No edema.      ASSESSMENT/PLAN:   HTN (hypertension) BP elevated while off meds. Resume HCTZ at 25 mg QD. D/C Aldactone. Home BP monitoring recommended. Bmet obtained to check her kidney functions. F/U scheduled with her PCP during this visit.  Macrocytic anemia Likely Vitamin B12 deficiency. Anemia panel obtained. In the meantime, she can resume OTC iron supplement. I will contact her with results soon.     Andrena Mews, MD [place]

## 2021-03-12 NOTE — Assessment & Plan Note (Signed)
BP elevated while off meds. Resume HCTZ at 25 mg QD. D/C Aldactone. Home BP monitoring recommended. Bmet obtained to check her kidney functions. F/U scheduled with her PCP during this visit.

## 2021-03-12 NOTE — Assessment & Plan Note (Signed)
Likely Vitamin B12 deficiency. Anemia panel obtained. In the meantime, she can resume OTC iron supplement. I will contact her with results soon.

## 2021-03-13 ENCOUNTER — Telehealth: Payer: Self-pay | Admitting: Family Medicine

## 2021-03-13 LAB — ANEMIA PROFILE B
Basophils Absolute: 0.1 10*3/uL (ref 0.0–0.2)
Basos: 1 %
EOS (ABSOLUTE): 0 10*3/uL (ref 0.0–0.4)
Eos: 0 %
Ferritin: 98 ng/mL (ref 15–150)
Folate: 17.7 ng/mL (ref >3.0)
Hematocrit: 30.8 % — ABNORMAL LOW (ref 34.0–46.6)
Hemoglobin: 9.9 g/dL — ABNORMAL LOW (ref 11.1–15.9)
Immature Grans (Abs): 0.1 10*3/uL (ref 0.0–0.1)
Immature Granulocytes: 1 %
Iron Saturation: 10 % — ABNORMAL LOW (ref 15–55)
Iron: 30 ug/dL (ref 27–159)
Lymphocytes Absolute: 2.3 10*3/uL (ref 0.7–3.1)
Lymphs: 21 %
MCH: 30 pg (ref 26.6–33.0)
MCHC: 32.1 g/dL (ref 31.5–35.7)
MCV: 93 fL (ref 79–97)
Monocytes Absolute: 1 10*3/uL — ABNORMAL HIGH (ref 0.1–0.9)
Monocytes: 9 %
Neutrophils Absolute: 7.4 10*3/uL — ABNORMAL HIGH (ref 1.4–7.0)
Neutrophils: 68 %
Platelets: 416 10*3/uL (ref 150–450)
RBC: 3.3 x10E6/uL — ABNORMAL LOW (ref 3.77–5.28)
RDW: 13 % (ref 11.7–15.4)
Retic Ct Pct: 1.9 % (ref 0.6–2.6)
Total Iron Binding Capacity: 288 ug/dL (ref 250–450)
UIBC: 258 ug/dL (ref 131–425)
Vitamin B-12: 703 pg/mL (ref 232–1245)
WBC: 10.7 10*3/uL (ref 3.4–10.8)

## 2021-03-13 LAB — BASIC METABOLIC PANEL
BUN/Creatinine Ratio: 13 (ref 9–23)
BUN: 12 mg/dL (ref 6–24)
CO2: 23 mmol/L (ref 20–29)
Calcium: 9.3 mg/dL (ref 8.7–10.2)
Chloride: 102 mmol/L (ref 96–106)
Creatinine, Ser: 0.94 mg/dL (ref 0.57–1.00)
Glucose: 87 mg/dL (ref 65–99)
Potassium: 4.1 mmol/L (ref 3.5–5.2)
Sodium: 141 mmol/L (ref 134–144)
eGFR: 76 mL/min/{1.73_m2} (ref >59)

## 2021-03-13 NOTE — Telephone Encounter (Signed)
HIPAA compliant callback message left:  If she calls, please advise  Hemoglobin level improved but still low. Although normal ferritin level, her iron saturation is like, depicting iron deficiency anemia. Her Vit B12 and folate levels are normal.  She may continue Ferrous sulfate/iron pills one tab daily or every other day. F/U with PCP in 4-8 weeks for recheck. Advise ED visit, if feeling excessively weak or dizzy.  Kidney test is normal.

## 2021-03-15 NOTE — Telephone Encounter (Signed)
Patient returns call to nurse line. Patient advised of results and need to continue iron one tab daily. Patient appreciative of time.

## 2021-04-12 ENCOUNTER — Ambulatory Visit: Payer: Medicaid Other | Admitting: Family Medicine

## 2021-04-12 NOTE — Progress Notes (Deleted)
    SUBJECTIVE:   CHIEF COMPLAINT / HPI: HTN follow up   Rachel Mckinney is a 45 year old female presenting for hypertension follow-up and to discuss the following:  Hypertension: She recently was seen in 02/2021 and restarted on HCTZ 25 mg.  Iron deficiency anemia: Chronic history intermittently since 2009 usually around 10-11, felt to be nutritional/related to menstrual cycle in addition to chronic disease.  Recent iron saturation 10%, recommended continuing iron supplementation and recheck in 4 weeks when seen in March.   PERTINENT  PMH / PSH: ***  OBJECTIVE:   There were no vitals taken for this visit.  ***  ASSESSMENT/PLAN:   No problem-specific Assessment & Plan notes found for this encounter.     Patriciaann Clan, [place]   {    This will disappear when note is signed, click to select method of visit    :1}

## 2021-05-27 ENCOUNTER — Ambulatory Visit (INDEPENDENT_AMBULATORY_CARE_PROVIDER_SITE_OTHER): Payer: Medicaid Other | Admitting: Family Medicine

## 2021-05-27 ENCOUNTER — Encounter: Payer: Self-pay | Admitting: Family Medicine

## 2021-05-27 ENCOUNTER — Other Ambulatory Visit: Payer: Self-pay

## 2021-05-27 VITALS — BP 144/102 | HR 98 | Ht 65.0 in | Wt 218.2 lb

## 2021-05-27 DIAGNOSIS — I10 Essential (primary) hypertension: Secondary | ICD-10-CM

## 2021-05-27 DIAGNOSIS — L732 Hidradenitis suppurativa: Secondary | ICD-10-CM

## 2021-05-27 DIAGNOSIS — B373 Candidiasis of vulva and vagina: Secondary | ICD-10-CM

## 2021-05-27 DIAGNOSIS — B3731 Acute candidiasis of vulva and vagina: Secondary | ICD-10-CM

## 2021-05-27 MED ORDER — DOXYCYCLINE HYCLATE 100 MG PO TABS: 100.0000 mg | ORAL_TABLET | Freq: Two times a day (BID) | ORAL | 0 refills | Status: DC

## 2021-05-27 MED ORDER — FLUCONAZOLE 150 MG PO TABS: 150.0000 mg | ORAL_TABLET | ORAL | 0 refills | Status: DC

## 2021-05-27 NOTE — Assessment & Plan Note (Signed)
Discussed that since there is a small area of fluctuance, could consider incision and drainage, but patient is very adamant that she would like to hold off on this today since it has been draining.  Was able to express a small amount of purulent drainage from one of the tracks.  Given that it is obviously infected, will treat with a 10-day course of doxycycline.  She has done well with this in the past.  Also prescribed fluconazole to use if she has a yeast infection after antibiotics, she gets these frequently.  Advised patient to follow-up with her dermatologist as soon as possible as well given that her disease is now occurring again.

## 2021-05-27 NOTE — Assessment & Plan Note (Signed)
BP is elevated today, she reports compliance with her blood pressure medications, but states that she is in a large amount of pain given her presenting problem.  Follow-up in 1 month with her PCP.

## 2021-05-27 NOTE — Patient Instructions (Signed)
Thank you for coming to see me today. It was a pleasure. Today we talked about:   Take doxycyline 100mg  twice a day for 10 days  I have given you pills for a yeast infection in case you get this after your antibiotics.  Please follow-up with your PCP in 1 month for your blood pressure and your dermatologist soon.  If you have any questions or concerns, please do not hesitate to call the office at [phone].  Best,   [place] Constable, DO

## 2021-05-27 NOTE — Progress Notes (Signed)
    SUBJECTIVE:   CHIEF COMPLAINT / HPI:   Vaginal Boil History of HS for many years Has had prior surgery for excision This is the first one she has had in months No fevers Has been draining since last night, first noticed 2-3 days ago Feels like it is very hard and painful Is on remicade infusion Gets yeast infections frequently from antibiotics   PERTINENT  PMH / PSH: Hidradenitis suppurativa, tobacco abuse, morbid obesity, depression  OBJECTIVE:   BP (!) 144/102   Pulse 98   Ht 5\' 5"  (1.651 m)   Wt 218 lb 3.2 oz (99 kg)   SpO2 99%   BMI 36.31 kg/m    Physical Exam: General: In NAD Respiratory: Breathing comfortably on room air GU: Pelvic exam performed with patient supine.  Chaperone in room.  Bilateral labia with extensive scarring and tracts noted, right labia with erythema, indurated extending to most superior pole of right labia majora, small area of fluctuance with tenderness to palpation at inferior aspect.     ASSESSMENT/PLAN:   Hidradenitis suppurativa-chronic and involving multiple sites Discussed that since there is a small area of fluctuance, could consider incision and drainage, but patient is very adamant that she would like to hold off on this today since it has been draining.  Was able to express a small amount of purulent drainage from one of the tracks.  Given that it is obviously infected, will treat with a 10-day course of doxycycline.  She has done well with this in the past.  Also prescribed fluconazole to use if she has a yeast infection after antibiotics, she gets these frequently.  Advised patient to follow-up with her dermatologist as soon as possible as well given that her disease is now occurring again.  HTN (hypertension) BP is elevated today, she reports compliance with her blood pressure medications, but states that she is in a large amount of pain given her presenting problem.  Follow-up in 1 month with her PCP.     Cleophas Dunker,  [place]

## 2021-07-21 ENCOUNTER — Ambulatory Visit (HOSPITAL_COMMUNITY)
Admission: RE | Admit: 2021-07-21 | Discharge: 2021-07-21 | Disposition: A | Discharge status: Home / Self Care | Payer: PRIVATE HEALTH INSURANCE | Source: Ambulatory Visit | Attending: Medical Oncology | Admitting: Medical Oncology

## 2021-07-21 ENCOUNTER — Encounter (HOSPITAL_COMMUNITY): Payer: Self-pay

## 2021-07-21 ENCOUNTER — Other Ambulatory Visit: Payer: Self-pay

## 2021-07-21 VITALS — BP 152/79 | HR 73 | Temp 98.3°F | Resp 18

## 2021-07-21 DIAGNOSIS — L732 Hidradenitis suppurativa: Secondary | ICD-10-CM | POA: Insufficient documentation

## 2021-07-21 DIAGNOSIS — L0291 Cutaneous abscess, unspecified: Secondary | ICD-10-CM | POA: Diagnosis not present

## 2021-07-21 MED ORDER — DOXYCYCLINE HYCLATE 100 MG PO TABS: 100.0000 mg | ORAL_TABLET | Freq: Two times a day (BID) | ORAL | 0 refills | Status: DC

## 2021-07-21 NOTE — ED Provider Notes (Signed)
[facility]    CSN: [id number] Arrival date & time: 07/21/21  1258      History   Chief Complaint Chief Complaint  Patient presents with   Abscess    APPOINTMENT    HPI Rachel Mckinney is a 45 y.o. female.   HPI  Abscess: Patient reports that starting yesterday she had a flare of her hidradenitis.  She states that she mainly gets her hidradenitis in her groin.  She states that overnight the area is enlarged.  She is not having any fevers, vomiting and has no history of MRSA infection.  She is not a diabetic to her knowledge.  She has been using warm compresses and believes that one of the lesions has erupted but is still very painful and large.  She normally needs antibiotics when this occurs.   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 Added automatically from request for surgery [id number] Formatting of this note might be different from the original. Added automatically from request for surgery [id number]   Left wrist pain 11/10/2020   Polyarthralgia    Possible Peroneal tendonitis of left lower leg 2018   Dx by Podiatry   Presence of (intrauterine) contraceptive device 06/29/2020    Patient Active Problem List   Diagnosis Date Noted   Macrocytic anemia 03/12/2021   No-show for appointment 03/09/2021   Depression, major, single episode, severe ([place]) 01/13/2021   Extensor tenosynovitis of left wrist 11/10/2020   Flexor tenosynovitis of left wrist 11/10/2020   Sedimentation rate elevation 11/10/2020   Left medial elbow lymphadenopathy  11/10/2020   Abnormal finding on CT scan of Pelvis 11/10/2020   Polyarthralgia 09/10/2020   History of ANA positive 11/18/2018   Tobacco abuse 05/12/2015   HTN (hypertension) 05/12/2015   Hidradenitis suppurativa-chronic and involving multiple sites 02/10/2012   Morbid obesity ([place]) 11/16/2011    Past Surgical History:  Procedure Laterality Date   CESAREAN SECTION     multiple abscess drainages     VULVECTOMY PARTIAL Left 02/21/2020   [facility]; Dorien Chihuahua, MD    OB History     Gravida  3   Para      Term      Preterm      AB  2   Living  1      SAB      IAB      Ectopic      Multiple      Live Births  1            Home Medications    Prior to Admission medications   Medication Sig Start Date End Date Taking? Authorizing Provider  cholecalciferol (VITAMIN D3) 25 MCG (1000 UNIT) tablet Take 1,000 Units by mouth daily.    [provider]  doxycycline (VIBRA-TABS) 100 MG tablet Take 1 tablet (100 mg total) by mouth 2 (two) times daily. 05/27/21   Meccariello, Bernita Raisin, DO  fluconazole (DIFLUCAN) 150 MG tablet Take 1 tablet (150 mg total) by mouth every 3 (three) days.  05/27/21   Meccariello, Bernita Raisin, DO  hydrochlorothiazide (HYDRODIURIL) 25 MG tablet Take 1 tablet (25 mg total) by mouth daily. 03/12/21   Kinnie Feil, MD  Multiple Vitamin (MULTI-VITAMIN) tablet Take 1 tablet by mouth daily.    [provider]  naproxen (NAPROSYN) 500 MG tablet Take 1 tablet (500 mg total) by mouth 2 (two) times daily as needed for moderate pain. 01/13/21   Patriciaann Clan, DO  pantoprazole (PROTONIX) 40 MG tablet Take 1 tablet (40 mg total) by mouth daily. 11/10/20   Alcus Dad, MD  predniSONE (DELTASONE) 10 MG tablet Take 10 mg by mouth daily with breakfast.    [provider]  sertraline (ZOLOFT) 50 MG tablet Take 1 tablet (50 mg total) by mouth daily. Take '25mg'$  for first few days, then '50mg'$  for one week, if tolerating increase to  '100mg'$  01/13/21   Patriciaann Clan, DO  Turmeric 500 MG CAPS Take by mouth.    [provider]    Family History Family History  Problem Relation Age of Onset   Liver disease Father    Hypertension Father    Hypertension Sister     Social History Social History   Tobacco Use   Smoking status: Every Day    Packs/day: 0.10    Types: Cigarettes   Smokeless tobacco: Never  Vaping Use   Vaping Use: Never used  Substance Use Topics   Alcohol use: Yes    Comment: on weekends   Drug use: Not Currently     Allergies   Patient has no known allergies.   Review of Systems Review of Systems  As stated above in HPI Physical Exam Triage Vital Signs ED Triage Vitals  Enc Vitals Group     BP 07/21/21 1322 (!) 152/79     Pulse Rate 07/21/21 1322 73     Resp 07/21/21 1322 18     Temp 07/21/21 1322 98.3 F (36.8 C)     Temp Source 07/21/21 1322 Oral     SpO2 07/21/21 1322 100 %     Weight --      Height --      Head Circumference --      Peak Flow --      Pain Score 07/21/21 1320 10     Pain Loc --      Pain Edu? --      Excl. in [place]? --    No data found.  Updated Vital Signs BP (!) 152/79 (BP Location: Right Wrist)   Pulse 73   Temp 98.3 F (36.8 C) (Oral)   Resp 18   SpO2 100%    Physical Exam Vitals and nursing note reviewed.  Cardiovascular:     Rate and Rhythm: Normal rate and regular rhythm.     Heart sounds: Normal heart sounds.  Pulmonary:     Effort: Pulmonary effort is normal.     Breath sounds: Normal breath sounds.  Genitourinary:    Comments: There is one 1 inch abscess of the left inner thigh without significant induration or fluctuance.  No significant sign of erythema and no sign of discharge.  There is enlargement of the right superior labia majora from an abscess of the inguinal fold.  There does not appear to be any involvement of a Bartholin gland.  The area is leaking a brownish-yellow discharge.  The area is very fluctuant.      UC Treatments / Results  Labs (all labs ordered are listed,  but only abnormal results are displayed) Labs Reviewed - No data to display  EKG   Radiology No results found.  Procedures Procedures (including critical care time)  Medications Ordered in UC Medications - No data to display  Initial Impression / Assessment and Plan / UC Course  I have reviewed the triage vital signs and the nursing notes.  Pertinent labs & imaging results that were available during my care of the patient were reviewed by me and considered in my medical decision making (see chart for details).     New.  Abscess with cellulitis.  We are going to treat with doxycycline to prevent further systemic infection.  Patient has had multiple surgeries on her groin area for hidradenitis.  She is afebrile and the area of most concern is leaking on its own.  We have elected to start her on doxycycline and have her take warm Epsom salt baths and try to express the lesion on her own given the amount of fluctuance and history of scar tissue buildup.  I would like for her to return in 1 to 2 days if symptoms are not significantly improving for an I&D.  Discussed red flag signs symptoms. Final Clinical Impressions(s) / UC Diagnoses   Final diagnoses:  None   Discharge Instructions   None    ED Prescriptions   None    PDMP not reviewed this encounter.   Hughie Closs, [place] 07/21/21 1356

## 2021-07-21 NOTE — ED Triage Notes (Signed)
Pt presents with multiple abscesses in groin area that started yesterday.

## 2021-07-24 LAB — AEROBIC CULTURE W GRAM STAIN (SUPERFICIAL SPECIMEN): Gram Stain: NONE SEEN

## 2021-09-14 ENCOUNTER — Other Ambulatory Visit: Payer: Self-pay | Admitting: Family Medicine

## 2021-09-27 ENCOUNTER — Ambulatory Visit: Payer: PRIVATE HEALTH INSURANCE

## 2021-10-20 ENCOUNTER — Encounter: Payer: Self-pay | Admitting: *Deleted

## 2021-10-20 ENCOUNTER — Ambulatory Visit (INDEPENDENT_AMBULATORY_CARE_PROVIDER_SITE_OTHER): Payer: PRIVATE HEALTH INSURANCE | Admitting: Student

## 2021-10-20 ENCOUNTER — Encounter: Payer: Self-pay | Admitting: Student

## 2021-10-20 ENCOUNTER — Other Ambulatory Visit: Payer: Self-pay

## 2021-10-20 VITALS — BP 140/87 | HR 66 | Ht 65.0 in | Wt 219.8 lb

## 2021-10-20 DIAGNOSIS — I10 Essential (primary) hypertension: Secondary | ICD-10-CM | POA: Diagnosis not present

## 2021-10-20 DIAGNOSIS — D509 Iron deficiency anemia, unspecified: Secondary | ICD-10-CM | POA: Diagnosis not present

## 2021-10-20 DIAGNOSIS — Z23 Encounter for immunization: Secondary | ICD-10-CM | POA: Diagnosis not present

## 2021-10-20 DIAGNOSIS — K921 Melena: Secondary | ICD-10-CM | POA: Diagnosis not present

## 2021-10-20 MED ORDER — HYDROCHLOROTHIAZIDE 25 MG PO TABS: ORAL_TABLET | ORAL | 3 refills | Status: DC

## 2021-10-20 NOTE — Assessment & Plan Note (Addendum)
With her fatigue and active ongoing blood loss per rectum, will assess CBC today. Consider also that anemia may be exacerbated by infliximab use. Will also obtain full iron panel to assess iron stores. Depending on how low Hgb is, will consider oral vs IV iron supplementation.

## 2021-10-20 NOTE — Assessment & Plan Note (Signed)
With history of RA, patient is at high risk for autoimmune processes such as Crohn's Disease or UC. No evidence of hemorrhoids on exam. Patient has no history of colonoscopy. - Referred to GI for urgent colonoscopy - Will obtain anemia panel as above

## 2021-10-20 NOTE — Progress Notes (Signed)
    SUBJECTIVE:   CHIEF COMPLAINT / HPI:  Bloody stools Patient reports 2 months of bloody stools. Patient has noticed dark red/brown blood mixed in with her stool and on paper after wiping. She also endorses a white, cloudy, mucousy discharge mixed in with her stool. Does not have any rectal discharge at rest. This happens with every bowel movement and she has been passing stool 4 times daily. She is not experiencing any abdominal pain and says that the stools are normal in consistency. She does not have a personal or family history of IBD or colon cancer. Has never had a colonoscopy.   Fatigue History of Anemia Patient reports severe fatigue for several weeks now. She says that her current feeling is very similar to how she has felt in the past when her hemoglobin has been low. She also recently restarted her Infliximab therapy for rheumatoid arthritis. No dizziness or palpitations.   PERTINENT  PMH / PSH: Rheumatoid arthritis, on infliximab  OBJECTIVE:   BP 140/87   Pulse 66   Ht 5\' 5"  (1.651 m)   Wt 219 lb 12.8 oz (99.7 kg)   SpO2 100%   BMI 36.58 kg/m   Physical Exam Vitals reviewed. Exam conducted with a chaperone present.  Constitutional:      Comments: Tired appearing but non-toxic  Cardiovascular:     Rate and Rhythm: Normal rate and regular rhythm.  Pulmonary:     Effort: Pulmonary effort is normal.     Breath sounds: Normal breath sounds.  Genitourinary:    Comments: Gluteal cleft with multiple open hydradenitis tracts. No external hemorrhoids and no mass or rectal abnormality on digital rectal exam.  Skin:    General: Skin is warm and dry.     Capillary Refill: Capillary refill takes less than 2 seconds.     Coloration: Skin is pale.  Neurological:     General: No focal deficit present.  Psychiatric:        Mood and Affect: Mood normal.        Thought Content: Thought content normal.     ASSESSMENT/PLAN:   Anemia With her fatigue and active ongoing blood  loss per rectum, will assess CBC today. Consider also that anemia may be exacerbated by infliximab use. Will also obtain full iron panel to assess iron stores. Depending on how low Hgb is, will consider oral vs IV iron supplementation.   Passage of bloody stools With history of RA, patient is at high risk for autoimmune processes such as Crohn's Disease or UC. No evidence of hemorrhoids on exam. Patient has no history of colonoscopy. - Referred to GI for urgent colonoscopy - Will obtain anemia panel as above    Pearla Dubonnet, MD [place]

## 2021-10-20 NOTE — Patient Instructions (Signed)
Rachel Mckinney,  It is so nice to meet you! It is a joy to be your new primary doctor! Thank you for coming in today.   As a reminder, here is a recap of what we talked about today:  -I am concerned about the blood you are having in your stool. I am placing a referral to the gastroenterologist to set you up for a colonoscopy.  They will call you to set up this appointment. - I refilled your hydrochlorothiazide  We are checking some labs today. I will call you if they are abnormal. I will send you a MyChart message or a letter if they are normal.  If you do not hear about your labs in the next 2 weeks please let us know.  I recommend that you always bring your medications to each appointment as this makes it easy to ensure we are on the correct medications and helps [place] not miss when refills are needed.  Take care and seek immediate care sooner if you develop any concerns.   Marnee Guarneri, MD [place]

## 2021-10-21 LAB — CBC
Hematocrit: 37.8 % (ref 34.0–46.6)
Hemoglobin: 12.9 g/dL (ref 11.1–15.9)
MCH: 32.7 pg (ref 26.6–33.0)
MCHC: 34.1 g/dL (ref 31.5–35.7)
MCV: 96 fL (ref 79–97)
Platelets: 232 10*3/uL (ref 150–450)
RBC: 3.94 x10E6/uL (ref 3.77–5.28)
RDW: 13.4 % (ref 11.7–15.4)
WBC: 6.1 10*3/uL (ref 3.4–10.8)

## 2021-10-21 LAB — IRON AND TIBC
Iron Saturation: 26 % (ref 15–55)
Iron: 97 ug/dL (ref 27–159)
Total Iron Binding Capacity: 369 ug/dL (ref 250–450)
UIBC: 272 ug/dL (ref 131–425)

## 2021-10-21 LAB — FERRITIN: Ferritin: 67 ng/mL (ref 15–150)

## 2021-12-23 ENCOUNTER — Ambulatory Visit: Payer: PRIVATE HEALTH INSURANCE | Admitting: Advanced Practice Midwife

## 2022-01-03 ENCOUNTER — Other Ambulatory Visit: Payer: Self-pay

## 2022-01-03 ENCOUNTER — Encounter: Payer: Self-pay | Admitting: Nurse Practitioner

## 2022-01-03 ENCOUNTER — Ambulatory Visit (INDEPENDENT_AMBULATORY_CARE_PROVIDER_SITE_OTHER): Payer: PRIVATE HEALTH INSURANCE | Admitting: Nurse Practitioner

## 2022-01-03 ENCOUNTER — Other Ambulatory Visit (HOSPITAL_COMMUNITY)
Admission: RE | Admit: 2022-01-03 | Discharge: 2022-01-03 | Disposition: A | Discharge status: Home / Self Care | Payer: 59 | Source: Ambulatory Visit | Attending: Advanced Practice Midwife | Admitting: Advanced Practice Midwife

## 2022-01-03 VITALS — BP 166/93 | HR 96 | Wt 225.0 lb

## 2022-01-03 DIAGNOSIS — Z1231 Encounter for screening mammogram for malignant neoplasm of breast: Secondary | ICD-10-CM

## 2022-01-03 DIAGNOSIS — Z124 Encounter for screening for malignant neoplasm of cervix: Secondary | ICD-10-CM | POA: Diagnosis present

## 2022-01-03 DIAGNOSIS — Z01419 Encounter for gynecological examination (general) (routine) without abnormal findings: Secondary | ICD-10-CM | POA: Diagnosis not present

## 2022-01-03 NOTE — Progress Notes (Signed)
GYNECOLOGY ANNUAL PREVENTATIVE CARE ENCOUNTER NOTE  Subjective:   Rachel Mckinney is a 46 y.o. [phone] female here for a routine annual gynecologic exam.  Current complaints: needs pap done.   Denies abnormal vaginal bleeding, discharge, pelvic pain, problems with intercourse or other gynecologic concerns.    Gynecologic History No LMP recorded. (Menstrual status: IUD). Contraception: IUD Last Pap: 12-03-18. Results were: normal Last mammogram: Unsure.   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 Added automatically from request for surgery [id number] Formatting of this note might be different from the original. Added automatically from request for surgery [id number]   Left wrist pain 11/10/2020   Polyarthralgia    Possible Peroneal tendonitis of left lower leg 2018   Dx by Podiatry   Presence of (intrauterine) contraceptive device 06/29/2020    Past Surgical History:  Procedure Laterality Date   CESAREAN SECTION     multiple abscess drainages     VULVECTOMY PARTIAL Left 02/21/2020   [facility]; Dorien Chihuahua, MD    Current Outpatient Medications on File Prior to Visit  Medication Sig Dispense Refill   hydrochlorothiazide (HYDRODIURIL) 25 MG tablet TAKE 1 TABLET(25 MG) BY MOUTH DAILY 90  tablet 3   inFLIXimab (REMICADE) 100 MG injection Inject into the vein.     Multiple Vitamin (MULTI-VITAMIN) tablet Take 1 tablet by mouth daily.     naproxen (NAPROSYN) 500 MG tablet Take 1 tablet (500 mg total) by mouth 2 (two) times daily as needed for moderate pain. 60 tablet 0   Turmeric 500 MG CAPS Take by mouth.     cholecalciferol (VITAMIN D3) 25 MCG (1000 UNIT) tablet Take 1,000 Units by mouth daily.     pantoprazole (PROTONIX) 40 MG tablet Take 1 tablet (40 mg total) by mouth daily. (Patient not taking: Reported on 01/03/2022) 14 tablet 0   No current facility-administered medications on file prior to visit.    No Known Allergies  Social History   Socioeconomic History   Marital status: Single    Spouse name: Not on file   Number of children: Not on file   Years of education: Not on file   Highest education level: Not on file  Occupational History   Not on file  Tobacco Use   Smoking status: Every Day    Packs/day: 0.10    Types: Cigarettes   Smokeless tobacco: Never  Vaping Use   Vaping Use: Never used  Substance and Sexual Activity   Alcohol use: Yes  Comment: on weekends   Drug use: Not Currently   Sexual activity: Not on file  Other Topics Concern   Not on file  Social History Narrative   Not on file   Social Determinants of Health   Financial Resource Strain: Not on file  Food Insecurity: Not on file  Transportation Needs: Not on file  Physical Activity: Not on file  Stress: Not on file  Social Connections: Not on file  Intimate Partner Violence: Not on file    Family History  Problem Relation Age of Onset   Liver disease Father    Hypertension Father    Hypertension Sister     The following portions of the patient's history were reviewed and updated as appropriate: allergies, current medications, past family history, past medical history, past social history, past surgical history and problem list.  Review of Systems Pertinent items noted  in HPI and remainder of comprehensive ROS otherwise negative.   Objective:  BP (!) 166/93    Pulse 96    Wt 225 lb (102.1 kg)    BMI 37.44 kg/m  CONSTITUTIONAL: Well-developed, well-nourished female in no acute distress.  HENT:  Normocephalic, atraumatic, External right and left ear normal.  EYES: Conjunctivae and EOM are normal. Pupils are equal, round.  No scleral icterus.  NECK: Normal range of motion, supple, no masses.  Normal thyroid.  SKIN: Skin is warm and dry. No rash noted. Not diaphoretic. No erythema. No pallor. NEUROLOGIC: Alert and oriented to person, place, and time. Normal reflexes, muscle tone coordination. No cranial nerve deficit noted. PSYCHIATRIC: Normal mood and affect. Normal behavior. Normal judgment and thought content. CARDIOVASCULAR: Normal heart rate noted, regular rhythm RESPIRATORY: Clear to auscultation bilaterally. Effort and breath sounds normal, no problems with respiration noted. BREASTS: Symmetric in size. No masses, skin changes, nipple drainage, or lymphadenopathy. ABDOMEN: Soft, no distention noted.  No tenderness, rebound or guarding.  PELVIC: Normal appearing external genitalia; normal appearing vaginal mucosa and cervix.  No abnormal discharge noted.  Pap smear obtained.  Normal uterine size, no other palpable masses, no uterine or adnexal tenderness.  IUD strings seen. MUSCULOSKELETAL: Normal range of motion. No tenderness.  No cyanosis, clubbing, or edema.    Assessment and Plan:  1. Women's annual routine gynecological examination Pap done today. IUD strings visualized. Has PCP she sees for chronic health conditions. Advised to stop smoking  2. Encounter for screening mammogram for malignant neoplasm of breast Breast Center closed today.  Will Schedule tomorrow and notify patient.  - MM Digital Screening; Future  3. Encounter for screening for cervical cancer  - Cytology - PAP( [facility])  Will follow up results of pap smear and manage  accordingly. Mammogram to be scheduled Routine preventative health maintenance measures emphasized. Please refer to After Visit Summary for other counseling recommendations.    Earlie Server, RN, MSN, NP-BC Nurse Practitioner, Branch for [facility]

## 2022-01-03 NOTE — Progress Notes (Signed)
Patient expressed concerns about possibly going through pre menopause. She stated that when she is e=sexually active she is "not producing as much vaginal moisture"

## 2022-01-18 ENCOUNTER — Encounter: Payer: Self-pay | Admitting: Nurse Practitioner

## 2022-01-18 DIAGNOSIS — R87612 Low grade squamous intraepithelial lesion on cytologic smear of cervix (LGSIL): Secondary | ICD-10-CM | POA: Insufficient documentation

## 2022-01-18 LAB — CYTOLOGY - PAP
Chlamydia: NEGATIVE
Comment: NEGATIVE
Comment: NEGATIVE
Comment: NEGATIVE
Comment: NEGATIVE
Comment: NORMAL
HPV 16: NEGATIVE
HPV 18 / 45: NEGATIVE
High risk HPV: POSITIVE — AB
Neisseria Gonorrhea: NEGATIVE
Trichomonas: NEGATIVE

## 2022-01-20 ENCOUNTER — Telehealth: Payer: Self-pay | Admitting: Family Medicine

## 2022-01-20 NOTE — Telephone Encounter (Signed)
Called pt to review results and provider recommendation. Patient unable to log into MyChart. Password reset.

## 2022-01-20 NOTE — Telephone Encounter (Signed)
Returned the patients call about scheduling her colpo procedure, she is wondering why she needs a colpo because she wasn't aware of it and wants more information about the procedure and why she needs it

## 2022-02-14 ENCOUNTER — Ambulatory Visit (INDEPENDENT_AMBULATORY_CARE_PROVIDER_SITE_OTHER): Payer: 59 | Admitting: Obstetrics and Gynecology

## 2022-02-14 ENCOUNTER — Other Ambulatory Visit: Payer: Self-pay

## 2022-02-14 ENCOUNTER — Encounter: Payer: Self-pay | Admitting: Obstetrics and Gynecology

## 2022-02-14 ENCOUNTER — Other Ambulatory Visit (HOSPITAL_COMMUNITY)
Admission: RE | Admit: 2022-02-14 | Discharge: 2022-02-14 | Disposition: A | Discharge status: Home / Self Care | Payer: 59 | Source: Ambulatory Visit | Attending: Obstetrics and Gynecology | Admitting: Obstetrics and Gynecology

## 2022-02-14 VITALS — BP 174/95 | HR 71 | Ht 65.0 in | Wt 223.9 lb

## 2022-02-14 DIAGNOSIS — D219 Benign neoplasm of connective and other soft tissue, unspecified: Secondary | ICD-10-CM | POA: Diagnosis not present

## 2022-02-14 DIAGNOSIS — Z975 Presence of (intrauterine) contraceptive device: Secondary | ICD-10-CM | POA: Insufficient documentation

## 2022-02-14 DIAGNOSIS — R87612 Low grade squamous intraepithelial lesion on cytologic smear of cervix (LGSIL): Secondary | ICD-10-CM | POA: Insufficient documentation

## 2022-02-14 LAB — POCT PREGNANCY, URINE: Preg Test, Ur: NEGATIVE

## 2022-02-14 MED ORDER — MEGESTROL ACETATE 40 MG PO TABS: 40.0000 mg | ORAL_TABLET | Freq: Every day | ORAL | 0 refills | Status: DC

## 2022-02-14 NOTE — Patient Instructions (Signed)
Uterine Fibroids Uterine fibroids are lumps of tissue (tumors) in the womb (uterus). Fibroids are not cancerous. Most women with this condition do not need treatment. Sometimes, fibroids can make it harder to have children. If this happens, you may need surgery to take out the fibroids. What are the causes? The cause of this condition is not known. What increases the risk? You are in your 30s or 40s and have not gone through menopause. Menopause is when you have not had a menstrual period for 12 months. Having a history of fibroids in your family. You are of African American descent. You started your period at age 38 or younger. You have not given birth. You are overweight or very overweight. What are the signs or symptoms? Bleeding between menstrual periods. Heavy bleeding during your menstrual period. Pain in the area between your hips. Needing to pee (urinate) right away or more often than usual. Not being able to have children (infertility). Not being able to stay pregnant (miscarriage). Many women do not have symptoms.  How is this treated? Treatment may include: Follow-up visits with your doctor to check your fibroids for any changes. Medicines to help with pain, such as aspirin or ibuprofen. Hormone therapy. This may be given as a pill, in a shot, or with a type of birth control device called an IUD. Surgery that would do one of these things: Take out the fibroids. This may be done if you want to become pregnant. Take out the womb (hysterectomy). Stop the blood flow to the fibroids. Follow these instructions at home: Medicines Take over-the-counter and prescription medicines only as told by your doctor. Ask your doctor if you should: Take iron pills. Eat more foods that have a lot of iron in them, such as dark green, leafy vegetables. Managing pain If told, put heat on your back or belly. Do this as often as told by your doctor. Use the heat source that your doctor  recommends, such as a moist heat pack or a heating pad. To do this: Put a towel between your skin and the heat pack or pad. Leave the heat on for 20-30 minutes. Take off the heat if your skin turns bright red. This is very important. If you cannot feel pain, heat, or cold, you may have a greater risk of getting burned.  General instructions Tell your doctor about any changes to your menstrual period, such as: Heavy bleeding that needs a change of tampons or pads more than normal. A change in how many days your period lasts. A change in symptoms that come with your period. This might be belly cramps or back pain. Keep all follow-up visits. Contact a doctor if: You have pain that does not get better with medicine or heat. This may include pain or cramps in: The area between your hip bones. Your back. Your belly. You have new bleeding between your periods. You have more bleeding during or between your periods. You feel very tired or weak. You feel dizzy. Get help right away if: You faint. You have pain in the area between your hip bones that gets worse. You have bleeding that soaks a tampon or pad in 30 minutes or less. Summary Uterine fibroids are lumps of tissue (tumors) in your womb. They are not cancerous. Medicines such as aspirin or ibuprofen may be used to help with pain. Contact a doctor if you have pain or cramps that do not get better with medicine. Know the symptoms for when you should  get help right away. This information is not intended to replace advice given to you by your health care provider. Make sure you discuss any questions you have with your health care provider. Document Revised: 07/07/2020 Document Reviewed: 07/07/2020 Elsevier Patient Education  [place] After The following information offers guidance on how to care for yourself after your procedure. Your doctor may also give you more specific instructions. If you have problems or  questions, contact your doctor. What can I expect after the procedure? If you did not have a sample of your tissue taken out (did not have a biopsy), you may only have some spotting of blood for a few days. You can go back to your normal activities. If you had a sample of your tissue taken out, it is common to have: Soreness and mild pain. These may last for a few days. Mild bleeding or fluid (discharge) coming from your vagina. The fluid will look dark and grainy. You may have this for a few days. The fluid may be caused by a liquid that was used during your procedure. You may need to wear a sanitary pad. Spotting of blood for at least 48 hours after the procedure. Follow these instructions at home: Medicines Take over-the-counter and prescription medicines only as told by your doctor. Ask your doctor what over-the-counter pain medicines and prescription medicines you can start taking again. This is very important if you take blood thinners. Activity For at least 3 days, or for as long as told by your doctor, avoid: Douching. Using tampons. Having sex. Return to your normal activities as told by your doctor. Ask your doctor what activities are safe for you. General instructions Ask your doctor if you may take baths, swim, or use a hot tub. You may take showers. If you use birth control (contraception), keep using it. Keep all follow-up visits. Contact a doctor if: You have a fever or chills. You faint or feel light-headed. Get help right away if: You bleed a lot from your vagina. A lot of bleeding means that the bleeding soaks through a pad in less than 1 hour. You have clumps of blood (blood clots) coming from your vagina. You have signs that could mean you have an infection. This may be fluid coming from your vagina that is: Different than normal. Yellow. Bad-smelling. You have very bad pain or cramps in your lower belly that do not get better with medicine. Summary If you did not  have a sample of your tissue taken out, you may only have some spotting of blood for a few days. You can go back to your normal activities. If you had a sample of your tissue taken out, it is common to have mild pain for a few days and spotting for 48 hours. Avoid douching, using tampons, and having sex for at least 3 days after the procedure or for as long as told. Get help right away if you have a lot of bleeding, very bad pain, or signs of infection. This information is not intended to replace advice given to you by your health care provider. Make sure you discuss any questions you have with your health care provider. Document Revised: 05/02/2021 Document Reviewed: 05/02/2021 Elsevier Patient Education  [place].

## 2022-02-14 NOTE — Progress Notes (Signed)
° ° °  GYNECOLOGY CLINIC COLPOSCOPY PROCEDURE NOTE  46 y.o. [id number] here for colposcopy for low-grade squamous intraepithelial neoplasia (LGSIL - encompassing HPV,mild dysplasia,CIN I) pap smear on 1/23. Discussed role for HPV in cervical dysplasia, need for surveillance.  Patient given informed consent, signed copy in the chart, time out was performed.  Placed in lithotomy position. Cervix viewed with speculum and colposcope after application of acetic acid.   Colposcopy adequate? Yes  acetowhite lesion(s) noted at 12 & 6 o'clock; corresponding biopsies obtained.  ECC specimen obtained. Monsel's applied. IUD string noted All specimens were labelled and sent to pathology.    Pt also reports pain with uterine fibroids and prolonged cycle this month   A/P Abnormal pap smear        Uterine fibroids Will check GYN U/S. Megace qd x 7 .  Patient was given post procedure instructions.  Will follow up pathology and manage accordingly.  Routine preventative health maintenance measures emphasized.    Arlina Robes  MD, [place] Attending [place] for [facility], [place]

## 2022-02-16 LAB — SURGICAL PATHOLOGY

## 2022-02-22 ENCOUNTER — Ambulatory Visit (HOSPITAL_COMMUNITY)
Admission: RE | Admit: 2022-02-22 | Discharge: 2022-02-22 | Disposition: A | Discharge status: Home / Self Care | Payer: 59 | Source: Ambulatory Visit | Attending: Obstetrics and Gynecology | Admitting: Obstetrics and Gynecology

## 2022-02-22 ENCOUNTER — Other Ambulatory Visit: Payer: Self-pay

## 2022-02-22 DIAGNOSIS — D259 Leiomyoma of uterus, unspecified: Secondary | ICD-10-CM | POA: Insufficient documentation

## 2022-02-22 DIAGNOSIS — N83292 Other ovarian cyst, left side: Secondary | ICD-10-CM | POA: Insufficient documentation

## 2022-02-22 DIAGNOSIS — D219 Benign neoplasm of connective and other soft tissue, unspecified: Secondary | ICD-10-CM | POA: Insufficient documentation

## 2022-02-23 ENCOUNTER — Other Ambulatory Visit: Payer: Self-pay | Admitting: Obstetrics and Gynecology

## 2022-02-23 DIAGNOSIS — N83202 Unspecified ovarian cyst, left side: Secondary | ICD-10-CM

## 2022-02-28 NOTE — Progress Notes (Unsigned)
Office Visit Note  Patient: Rachel Mckinney             Date of Birth: 21-Oct-1976           MRN: [id number]             PCP: Eppie Gibson, MD Referring: Wendall Mola, MD Visit Date: 03/01/2022 Occupation: '@GUAROCC'$ @  Subjective:  No chief complaint on file.   History of Present Illness: Rachel Mckinney is a 46 y.o. female here to establish care for rheumatoid arthritis and hidradenitis suppurativa on remicade 800 mg IV q8wks. She also has a history of HTN, anemia, depression, and lymphadenopathy. She is transferring care from [facility] with Dr. Graylin Shiver due to change in insurance status. She has a longstanding history of HS previously treated with antibiotics and surgeries and was on Humira prior to remicade, with incomplete symptom improvement. Remicade started since 03/2021 with large improvement in left wrist inflammation.***    Imaging reviewed MRI of the left wrist 11/09/2020 "IMPRESSION:  1. Severe synovitis throughout the radiocarpal joint, midcarpal row, carpometacarpal articulation, and at the articulation between the triquetrum and pisiform bone. Erosions in ill-defined edema along portions of the carpus and metacarpal bases associated with the striking synovitis. Overall findings are highly suspicious for an inflammatory or crystalline arthropathy such as rheumatoid arthritis. Infectious arthropathy is considered less likely given the multi joint involvement, although is not totally excluded.  2. Tenosynovitis and peritendinitis involving the extensor compartments and flexor tendons.  3. There is some local expansion and edema in the ulnar nerve at the level of the radiocarpal joint, significance uncertain. No obvious impinging lesion is observed in this vicinity.  4. Circumferential subcutaneous edema in the wrist, possibly with some low-grade enhancement-cellulitis not excluded. "   Activities of Daily Living:  Patient reports morning stiffness for ***  {minute/hour:19697}.   Patient {ACTIONS;DENIES/REPORTS:21021675::"Denies"} nocturnal pain.  Difficulty dressing/grooming: {ACTIONS;DENIES/REPORTS:21021675::"Denies"} Difficulty climbing stairs: {ACTIONS;DENIES/REPORTS:21021675::"Denies"} Difficulty getting out of chair: {ACTIONS;DENIES/REPORTS:21021675::"Denies"} Difficulty using hands for taps, buttons, cutlery, and/or writing: {ACTIONS;DENIES/REPORTS:21021675::"Denies"}  No Rheumatology ROS completed.   PMFS History:  Patient Active Problem List   Diagnosis Date Noted   Fibroids 02/14/2022   IUD (intrauterine device) in place 02/14/2022   Pap smear abnormality of cervix with LGSIL 01/18/2022   Passage of bloody stools 10/20/2021   Anemia 03/12/2021   No-show for appointment 03/09/2021   Depression, major, single episode, severe ([place]) 01/13/2021   Extensor tenosynovitis of left wrist 11/10/2020   Flexor tenosynovitis of left wrist 11/10/2020   Sedimentation rate elevation 11/10/2020   Left medial elbow lymphadenopathy 11/10/2020   Abnormal finding on CT scan of Pelvis 11/10/2020   Polyarthralgia 09/10/2020   History of ANA positive 11/18/2018   Tobacco abuse 05/12/2015   HTN (hypertension) 05/12/2015   Hidradenitis suppurativa-chronic and involving multiple sites 02/10/2012   Morbid obesity ([place]) 11/16/2011    Past Medical History:  Diagnosis Date   Arthralgia of right elbow 03/19/2018   Boils    under breast and inner thigh   De Quervain's syndrome (tenosynovitis) 12/27/2017   Hidradenitis suppurativa    Chronic problem for patient, multiple admissions for surgical drainage   History of ANA positive 11/18/2018   Hypertension    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 Added automatically from request for surgery [id number] Formatting of this note might be different from the original. Added automatically from  request  for surgery [phone]   Left wrist pain 11/10/2020   Polyarthralgia    Possible Peroneal tendonitis of left lower leg 2018   Dx by Podiatry   Presence of (intrauterine) contraceptive device 06/29/2020    Family History  Problem Relation Age of Onset   Liver disease Father    Hypertension Father    Hypertension Sister    Past Surgical History:  Procedure Laterality Date   CESAREAN SECTION     multiple abscess drainages     VULVECTOMY PARTIAL Left 02/21/2020   [facility]; Dorien Chihuahua, MD   Social History   Social History Narrative   Not on file   Immunization History  Administered Date(s) Administered   Influenza Split 09/03/2012   Influenza Whole 10/11/2007   Influenza,inj,Quad PF,6+ Mos 10/20/2021   Td 09/19/2003     Objective: Vital Signs: LMP 02/08/2022    Physical Exam   Musculoskeletal Exam: ***  CDAI Exam: CDAI Score: -- Patient Global: --; Provider Global: -- Swollen: --; Tender: -- Joint Exam 03/01/2022   No joint exam has been documented for this visit   There is currently no information documented on the homunculus. Go to the Rheumatology activity and complete the homunculus joint exam.  Investigation: No additional findings.  Imaging: US PELVIC COMPLETE WITH TRANSVAGINAL  Result Date: 02/22/2022 CLINICAL DATA:  Fibroids EXAM: TRANSABDOMINAL AND TRANSVAGINAL ULTRASOUND OF PELVIS TECHNIQUE: Both transabdominal and transvaginal ultrasound examinations of the pelvis were performed. Transabdominal technique was performed for global imaging of the pelvis including uterus, ovaries, adnexal regions, and pelvic cul-de-sac. It was necessary to proceed with endovaginal exam following the transabdominal exam to visualize the uterus endometrium ovaries. COMPARISON:  CT 11/10/2020, ultrasound 10/19/2018 FINDINGS: Uterus Measurements: 7.5 x 3.7 x 4.5 cm = volume: 65.5 mL. Multiple small uterine fibroids. Posterior fundal intramural fibroid measures 14 x 13 by 15 mm.  Submucosal mid anterior fibroid measures 12 x 15 x 11 mm. Left submucosal fibroid measures 19 x 19 x 16 mm, previously 2 x 31 x 31 mm. Intramural lower uterine segment fibroid measures 7 x 6 x 6 mm. Endometrium Thickness: 5 mm.  No focal abnormality visualized. Right ovary Measurements: 2.4 x 1.4 x 2.1 cm = volume: 3.5 mL. Normal appearance/no adnexal mass. Left ovary Measurements: 3.8 x 2.2 x 2.3 cm = volume: 10.1 mL. Complex cyst in the left ovary with fluid fluid level measuring 31 x 23 by 29 mm. Smaller complex cyst measuring 19 by 19 x 18 mm. Other findings Trace free fluid. IMPRESSION: 1. Multiple small uterine fibroids, some of these were not previously measured/identified. The largest fibroid measures 19 mm and appears slightly smaller compared to the prior pelvic ultrasound 2. Complex left ovarian cyst measuring up to 31 mm, indeterminate but probably benign and potentially representing hemorrhagic cysts or possible endometrioma. Recommend 6-12 week sonographic follow-up 3. Trace free fluid. Electronically Signed   By: Donavan Foil M.D.   On: 02/22/2022 16:06    Recent Labs: Lab Results  Component Value Date   WBC 6.1 10/20/2021   HGB 12.9 10/20/2021   PLT 232 10/20/2021   NA 141 03/12/2021   K 4.1 03/12/2021   CL 102 03/12/2021   CO2 23 03/12/2021   GLUCOSE 87 03/12/2021   BUN 12 03/12/2021   CREATININE 0.94 03/12/2021   BILITOT 0.5 11/10/2020   ALKPHOS 89 11/10/2020   AST 8 (L) 11/10/2020   ALT 12 11/10/2020   PROT 6.7 11/10/2020   ALBUMIN 2.6 (L)  11/10/2020   CALCIUM 9.3 03/12/2021   GFRAA 106 11/16/2020    Speciality Comments: No specialty comments available.  Procedures:  No procedures performed Allergies: Patient has no known allergies.   Assessment / Plan:     Visit Diagnoses: No diagnosis found.  Orders: No orders of the defined types were placed in this encounter.  No orders of the defined types were placed in this encounter.   Face-to-face time spent with  patient was *** minutes. Greater than 50% of time was spent in counseling and coordination of care.  Follow-Up Instructions: No follow-ups on file.   Collier Salina, MD  Note - This record has been created using [employer].  Chart creation errors have been sought, but may not always  have been located. Such creation errors do not reflect on  the standard of medical care.

## 2022-03-01 ENCOUNTER — Ambulatory Visit (INDEPENDENT_AMBULATORY_CARE_PROVIDER_SITE_OTHER): Payer: 59 | Admitting: Internal Medicine

## 2022-03-01 ENCOUNTER — Other Ambulatory Visit: Payer: Self-pay

## 2022-03-01 ENCOUNTER — Encounter: Payer: Self-pay | Admitting: Internal Medicine

## 2022-03-01 VITALS — BP 151/85 | HR 82 | Resp 15 | Ht 65.0 in | Wt 224.0 lb

## 2022-03-01 DIAGNOSIS — Z79899 Other long term (current) drug therapy: Secondary | ICD-10-CM | POA: Diagnosis not present

## 2022-03-01 DIAGNOSIS — L732 Hidradenitis suppurativa: Secondary | ICD-10-CM

## 2022-03-01 DIAGNOSIS — M059 Rheumatoid arthritis with rheumatoid factor, unspecified: Secondary | ICD-10-CM | POA: Diagnosis not present

## 2022-03-01 MED ORDER — PREDNISONE 10 MG PO TABS: ORAL_TABLET | ORAL | 0 refills | Status: AC

## 2022-03-05 LAB — CBC WITH DIFFERENTIAL/PLATELET
Absolute Monocytes: 672 cells/uL (ref 200–950)
Basophils Absolute: 33 cells/uL (ref 0–200)
Basophils Relative: 0.4 %
Eosinophils Absolute: 83 cells/uL (ref 15–500)
Eosinophils Relative: 1 %
HCT: 37.4 % (ref 35.0–45.0)
Hemoglobin: 12.5 g/dL (ref 11.7–15.5)
Lymphs Abs: 1585 cells/uL (ref 850–3900)
MCH: 32.3 pg (ref 27.0–33.0)
MCHC: 33.4 g/dL (ref 32.0–36.0)
MCV: 96.6 fL (ref 80.0–100.0)
MPV: 11.1 fL (ref 7.5–12.5)
Monocytes Relative: 8.1 %
Neutro Abs: 5926 cells/uL (ref 1500–7800)
Neutrophils Relative %: 71.4 %
Platelets: 326 10*3/uL (ref 140–400)
RBC: 3.87 10*6/uL (ref 3.80–5.10)
RDW: 12.5 % (ref 11.0–15.0)
Total Lymphocyte: 19.1 %
WBC: 8.3 10*3/uL (ref 3.8–10.8)

## 2022-03-05 LAB — QUANTIFERON-TB GOLD PLUS
Mitogen-NIL: >10 IU/mL
NIL: 0.02 IU/mL
QuantiFERON-TB Gold Plus: NEGATIVE
TB1-NIL: 0 IU/mL
TB2-NIL: 0 IU/mL

## 2022-03-05 LAB — COMPLETE METABOLIC PANEL WITH GFR
AG Ratio: 1.1 (calc) (ref 1.0–2.5)
ALT: 8 U/L (ref 6–29)
AST: 13 U/L (ref 10–35)
Albumin: 4 g/dL (ref 3.6–5.1)
Alkaline phosphatase (APISO): 114 U/L (ref 31–125)
BUN: 17 mg/dL (ref 7–25)
CO2: 26 mmol/L (ref 20–32)
Calcium: 8.8 mg/dL (ref 8.6–10.2)
Chloride: 105 mmol/L (ref 98–110)
Creat: 0.93 mg/dL (ref 0.50–0.99)
Globulin: 3.6 g/dL (calc) (ref 1.9–3.7)
Glucose, Bld: 91 mg/dL (ref 65–99)
Potassium: 4.4 mmol/L (ref 3.5–5.3)
Sodium: 140 mmol/L (ref 135–146)
Total Bilirubin: 0.4 mg/dL (ref 0.2–1.2)
Total Protein: 7.6 g/dL (ref 6.1–8.1)
eGFR: 77 mL/min/{1.73_m2} (ref >=60)

## 2022-03-05 LAB — RHEUMATOID FACTOR: Rheumatoid fact SerPl-aCnc: <14 IU/mL (ref <14)

## 2022-03-05 LAB — SEDIMENTATION RATE: Sed Rate: 36 mm/h — ABNORMAL HIGH (ref 0–20)

## 2022-03-05 LAB — C-REACTIVE PROTEIN: CRP: 29.5 mg/L — ABNORMAL HIGH (ref <8.0)

## 2022-03-05 LAB — CYCLIC CITRUL PEPTIDE ANTIBODY, IGG: Cyclic Citrullin Peptide Ab: <16 UNITS

## 2022-03-14 ENCOUNTER — Ambulatory Visit: Payer: 59 | Admitting: Rheumatology

## 2022-03-17 ENCOUNTER — Telehealth: Payer: Self-pay

## 2022-03-17 NOTE — Telephone Encounter (Signed)
Patient called to check the status of her Remicaid infusion.  Patient states at her npt appointment on 03/01/22 Dr. Benjamine Mola told her once he reviewed her labs he would contact her insurance to find out where she could have the infusion.  Patient states Dr. Benjamine Mola prescribed Prednisone while waiting on approval from insurance.  Patient states she has infusions every 8 weeks and was due on 01/17/22.   Patient requested a return call.    ?

## 2022-03-18 ENCOUNTER — Telehealth: Payer: Self-pay | Admitting: Pharmacist

## 2022-03-18 ENCOUNTER — Telehealth: Payer: Self-pay | Admitting: Pharmacy Technician

## 2022-03-18 DIAGNOSIS — M059 Rheumatoid arthritis with rheumatoid factor, unspecified: Secondary | ICD-10-CM

## 2022-03-18 NOTE — Telephone Encounter (Signed)
Auth Submission: PENDING ?Payer: FRIDAY ?Medication & CPT/J Code(s) submitted: Remicade (Infliximab) J1745 ?Route of submission (phone, fax, portal): PHONE; [phone] ?FAX: [phone] ?Auth type: Buy/Bill ?Units/visits requested: Q8WKS ?Reference number:  ? ? ?Will update once we receive a response. ? ?  ?

## 2022-03-18 NOTE — Telephone Encounter (Addendum)
Remicade referral placed for [facility].  Patient is overdue for infusion (was due 01/17/22).  ? ?Remicade dose: '800mg'$  IV every 8 weeks ? ?Pre-medications: acetaminophen '650mg'$  30 minutes before infusion and diphenhydramine 30 minutes before infusion ? ?Knox Saliva, PharmD, MPH, BCPS ?Clinical Pharmacist (Rheumatology and Pulmonology) ? ?----- Message from Collier Salina, MD sent at 03/18/2022  8:17 AM EDT ----- ?Regarding: Remicade ?Patient transferring care from [place] [facility] had been doing well on remicade 800 mg IV q8wks we can resume this. She had inadequate response to Humira. Baseline labs reviewed look okay. Last dose was in November. ? ? ?

## 2022-03-18 NOTE — Telephone Encounter (Signed)
Remicade referral placed for [facility].  Patient is overdue for infusion (was due 01/17/22). I ATC patient to advise that the infusion center will be reaching out to her once they have authorization is in place and/or if they have any additional questions. Left VM requesting return call. ? ?Knox Saliva, PharmD, MPH, BCPS ?Clinical Pharmacist (Rheumatology and Pulmonology) ?

## 2022-03-22 NOTE — Telephone Encounter (Addendum)
Dr Benjamine Mola, ?Fyi note: ? ?Auth Submission:APPROVED ?Payer: FRIDAY ?Medication & CPT/J Code(s) submitted: Remicade (Infliximab) J1745 ?Route of submission (phone, fax, portal): PHONE: [phone] ?FAX: [phone] ?Auth type: Buy/Bill ?Units/visits requested: Q8WKS ?Reference number: [id number] ?Approval from: 03/18/22 to 03/19/23  ? ?Remicade co-pay card: Approved ?PHONE: [phone] ?ID# [id number] ?GR# [id number] ?BIN# [id number] ?Fax claims: [phone] ? ?Patient will be scheduled as soon as possible. ?

## 2022-03-25 NOTE — Telephone Encounter (Signed)
Patient scheduled for Remicade infusion on 04/05/22. Will f/u to ensure completed ? ?Knox Saliva, PharmD, MPH, BCPS ?Clinical Pharmacist (Rheumatology and Pulmonology) ?

## 2022-03-28 NOTE — Telephone Encounter (Signed)
I called patient, patient verbalized understanding. 

## 2022-03-28 NOTE — Telephone Encounter (Signed)
It looks like she is scheduled for 4/18 infusion. I sent a prescription for prednisone at our last visit that might run out before that time. If she is doing okay, she can probably just wait until then since steroids are given alongside the remicade infusion. If she has a problem in between we could send a new Rx if needed.

## 2022-04-05 ENCOUNTER — Ambulatory Visit (INDEPENDENT_AMBULATORY_CARE_PROVIDER_SITE_OTHER): Payer: 59

## 2022-04-05 VITALS — BP 163/92 | HR 59 | Temp 97.8°F | Resp 18 | Ht 65.0 in | Wt 220.4 lb

## 2022-04-05 DIAGNOSIS — M1990 Unspecified osteoarthritis, unspecified site: Secondary | ICD-10-CM

## 2022-04-05 DIAGNOSIS — M1380 Other specified arthritis, unspecified site: Secondary | ICD-10-CM

## 2022-04-05 DIAGNOSIS — Z79899 Other long term (current) drug therapy: Secondary | ICD-10-CM

## 2022-04-05 DIAGNOSIS — M059 Rheumatoid arthritis with rheumatoid factor, unspecified: Secondary | ICD-10-CM | POA: Diagnosis not present

## 2022-04-05 DIAGNOSIS — L732 Hidradenitis suppurativa: Secondary | ICD-10-CM

## 2022-04-05 MED ORDER — ACETAMINOPHEN 325 MG PO TABS
650.0000 mg | ORAL_TABLET | Freq: Once | ORAL | Status: AC
  Administered 2022-04-05: 650 mg via ORAL
  Filled 2022-04-05: qty 2

## 2022-04-05 MED ORDER — SODIUM CHLORIDE 0.9 % IV SOLN
800.0000 mg | INTRAVENOUS | Status: DC
  Administered 2022-04-05: 800 mg via INTRAVENOUS
  Filled 2022-04-05: qty 80

## 2022-04-05 MED ORDER — DIPHENHYDRAMINE HCL 25 MG PO CAPS
25.0000 mg | ORAL_CAPSULE | Freq: Once | ORAL | Status: AC
  Administered 2022-04-05: 25 mg via ORAL
  Filled 2022-04-05: qty 1

## 2022-04-05 NOTE — Progress Notes (Signed)
Diagnosis: Rheumatoid Arthritis ? ?Provider:  Marshell Garfinkel, MD ? ?Procedure: Infusion ? ?IV Type: Peripheral, IV Location: L Antecubital ? ?Remicade (Infliximab),  Dose: 800 ? ?Infusion Start Time: 9211 ? ?Infusion Stop Time: 1310 ? ?Post Infusion IV Care: Patient declined observation and Peripheral IV Discontinued ? ?Discharge: Condition: Good, Destination: Home . AVS provided to patient.  ? ?Performed by:  Cleophus Molt, RN  ?  ?

## 2022-04-13 ENCOUNTER — Encounter: Payer: Self-pay | Admitting: Family Medicine

## 2022-04-13 ENCOUNTER — Ambulatory Visit (INDEPENDENT_AMBULATORY_CARE_PROVIDER_SITE_OTHER): Payer: 59 | Admitting: Family Medicine

## 2022-04-13 VITALS — BP 124/72 | HR 81 | Wt 216.4 lb

## 2022-04-13 DIAGNOSIS — K921 Melena: Secondary | ICD-10-CM | POA: Diagnosis not present

## 2022-04-13 DIAGNOSIS — Z1231 Encounter for screening mammogram for malignant neoplasm of breast: Secondary | ICD-10-CM | POA: Diagnosis not present

## 2022-04-13 DIAGNOSIS — L732 Hidradenitis suppurativa: Secondary | ICD-10-CM | POA: Diagnosis not present

## 2022-04-13 DIAGNOSIS — I10 Essential (primary) hypertension: Secondary | ICD-10-CM

## 2022-04-13 MED ORDER — DOXYCYCLINE HYCLATE 100 MG PO TABS: 100.0000 mg | ORAL_TABLET | Freq: Two times a day (BID) | ORAL | 0 refills | Status: DC

## 2022-04-13 MED ORDER — FLUCONAZOLE 150 MG PO TABS: 150.0000 mg | ORAL_TABLET | Freq: Once | ORAL | 0 refills | Status: AC

## 2022-04-13 NOTE — Patient Instructions (Signed)
It was wonderful to see you today. Thank you for allowing me to be a part of your care. Below is a short summary of what we discussed at your visit today: ? ?Bloody stools ?I do see hemorrhoid on exam today.  Try using Preparation H cream over-the-counter to help relieve this. ? ?I am still referring you to GI for colonoscopy.  If you do not hear from anybody in 2 weeks, give [place] a call.  I will send you the name of your GI doctor referral once I know who it will be. ? ?Hidradenitis suppurativa ?I prescribed a 10-day course of doxycycline.  This should be ready to pharmacy.  I have also sent in a one-time dose of an antiyeast infection medicine, since the chart so that you get these often with antibiotic medications. ? ?Mammogram ?I have ordered your routine mammogram to screen for breast cancer. This will be at the [facility]. You will call them directly to make an appointment at your convenience. Information below. ? ?  ? ? ?Please bring all of your medications to every appointment! ? ?If you have any questions or concerns, please do not hesitate to contact us via phone or MyChart message.  ? ?Ezequiel Essex, MD  ?

## 2022-04-18 ENCOUNTER — Encounter: Payer: Self-pay | Admitting: Family Medicine

## 2022-04-18 MED ORDER — HYDROCHLOROTHIAZIDE 25 MG PO TABS: ORAL_TABLET | ORAL | 3 refills | Status: AC

## 2022-04-18 NOTE — Progress Notes (Signed)
? ? ?SUBJECTIVE:  ? ?CHIEF COMPLAINT / HPI:  ? ?Bloody stools ?Patient presents for follow-up of bloody stools.  She originally presented to [facility] 10/20/2021 with this complaint.  Been going on for 2 months already.  At that time, reported dark reddish-brown blood mixed in with stool with concurrent cloudy mucus discharge.  No prior history of colonoscopy.  Given symptoms and her fatigue, provider at that time obtained CBC (hemoglobin 12.9) and referred to GI for urgent colonoscopy.  She was slated to undergo colonoscopy with [place] in [place], with PA Rolene Course.  Unfortunately, her insurance changed before the procedure and her new insurance would not cover the procedure through these providers.  Patient was told that [place] would facilitate getting her into a different GI practice, although patient never heard from them and did not receive any further calls.  Fortunately, even though her bright red blood per rectum has persisted, this concern fell to the wayside due to other life stressors.  She returns today for referral to GI.  She reports continued bright red blood per rectum as reported previously without much change, although this waxes and wanes.  She denies excessive fatigue, orthostatic hypotension, or dizziness.   ? ?Underarm and abdominal boils ?Patient would like evaluation of bilateral inguinal and underarm boils.  She reports intermittent history of these for several years.  She gets boils under her breasts, underarms, and in the inguinal folds. ? ?Hypertension ?Patient requests refill of her hypertension medication HCTZ 25 mg tablets.  She reports optimal adherence without adverse side effects.  Last creatinine check 03/01/2022 0.93, stable at baseline. ? ?BP Readings from Last 3 Encounters:  ?04/13/22 124/72  ?04/05/22 (!) 163/92  ?03/01/22 (!) 151/85  ?  ? ?PERTINENT  PMH / PSH: HTN, obesity, seropositive rheumatoid arthritis, hidradenitis suppurativa, blood  per rectum, anemia ? ?OBJECTIVE:  ? ?BP 124/72   Pulse 81   Wt 216 lb 6.4 oz (98.2 kg)   SpO2 99%   BMI 36.01 kg/m?   ? ?PHQ-9:  ? ?  04/13/2022  ? 11:14 AM 02/17/2022  ? 10:39 AM 01/05/2022  ?  9:18 AM  ?Depression screen PHQ 2/9  ?Decreased Interest 0 2 0  ?Down, Depressed, Hopeless 0 0 0  ?PHQ - 2 Score 0 2 0  ?Altered sleeping 0 2 3  ?Tired, decreased energy 0 2 3  ?Change in appetite 0 0 0  ?Feeling bad or failure about yourself  0 0 0  ?Trouble concentrating 0 0 0  ?Moving slowly or fidgety/restless 0 0 0  ?Suicidal thoughts 0 0 0  ?PHQ-9 Score 0 6 6  ?  ?Physical Exam ?General: Awake, alert, oriented, no acute distress ?Respiratory: Unlabored respirations, speaking in full sentences, no respiratory distress ?GI/GU: Deflated hemorrhoid visualized at 6 o'clock position, no internal hemorrhoids visualized with anoscope, scattered papular lesions in various stages and surrounding tracts and scarring visualized in bilateral inguinal creases ? ?ASSESSMENT/PLAN:  ? ?Passage of bloody stools ?Chronic, now approximately 6-8 months. No changes to character or amount. Visualized single deflated hemorrhoid at 6 o clock position, however doubt this is significantly contributory.  Recommend Preparation H for comfort. Will re-refer to GI for urgent colonoscopy.  No CBC today given she is asymptomatic with normal hemoglobin in March. ? ?Hidradenitis suppurativa-chronic and involving multiple sites ?Stage II.  Multiple boils across bilateral inguinal creases.  Will Rx 10-day course of doxycycline.  Rx concurrent fluconazole x1 given history of yeast  infections with antibiotic usage.  Return precautions given, see AVS for more. ? ?HTN (hypertension) ?Stable, at goal.  Tolerating well without side effects. Rx refill of HCTZ 25 mg tablets.  Last creatinine check 03/01/2022 stable at baseline. ?  ? ? ?Ezequiel Essex, MD ?[place]  ?

## 2022-04-18 NOTE — Assessment & Plan Note (Signed)
Stage II.  Multiple boils across bilateral inguinal creases.  Will Rx 10-day course of doxycycline.  Rx concurrent fluconazole x1 given history of yeast infections with antibiotic usage.  Return precautions given, see AVS for more. ?

## 2022-04-18 NOTE — Assessment & Plan Note (Signed)
Chronic, now approximately 6-8 months. No changes to character or amount. Visualized single deflated hemorrhoid at 6 o clock position, however doubt this is significantly contributory.  Recommend Preparation H for comfort. Will re-refer to GI for urgent colonoscopy.  No CBC today given she is asymptomatic with normal hemoglobin in March. ?

## 2022-04-18 NOTE — Assessment & Plan Note (Addendum)
Stable, at goal.  Tolerating well without side effects. Rx refill of HCTZ 25 mg tablets.  Last creatinine check 03/01/2022 stable at baseline. ?

## 2022-05-13 IMAGING — US US PELVIS COMPLETE WITH TRANSVAGINAL
1 series · 13 of 25 positions shown · non-contrast
Comparison: CT 11/10/2020, ultrasound 10/19/2018

CLINICAL DATA: Fibroids

EXAM:
TRANSABDOMINAL AND TRANSVAGINAL ULTRASOUND OF PELVIS
TECHNIQUE: Both transabdominal and transvaginal ultrasound examinations of the
pelvis were performed. Transabdominal technique was performed for
global imaging of the pelvis including uterus, ovaries, adnexal
regions, and pelvic cul-de-sac. It was necessary to proceed with
endovaginal exam following the transabdominal exam to visualize the
uterus endometrium ovaries.

[Series 1: us pelvic complete with transvaginal · 113 acquisitions, 13 frames shown]
[im 1/113]
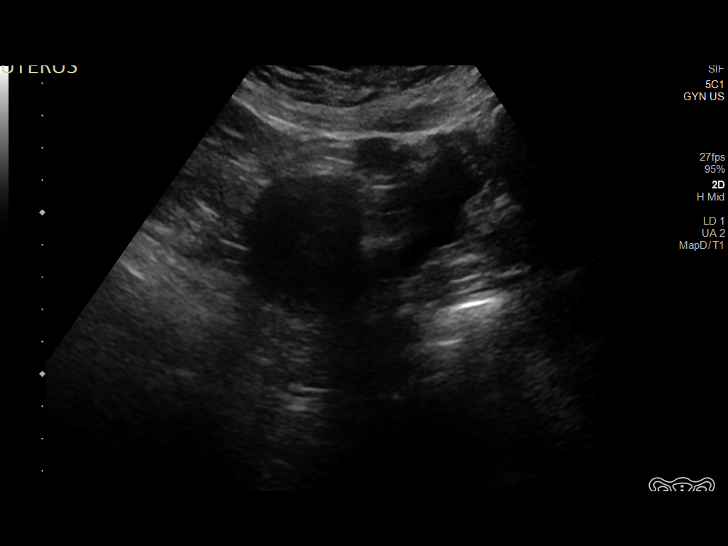
[im 10/113]
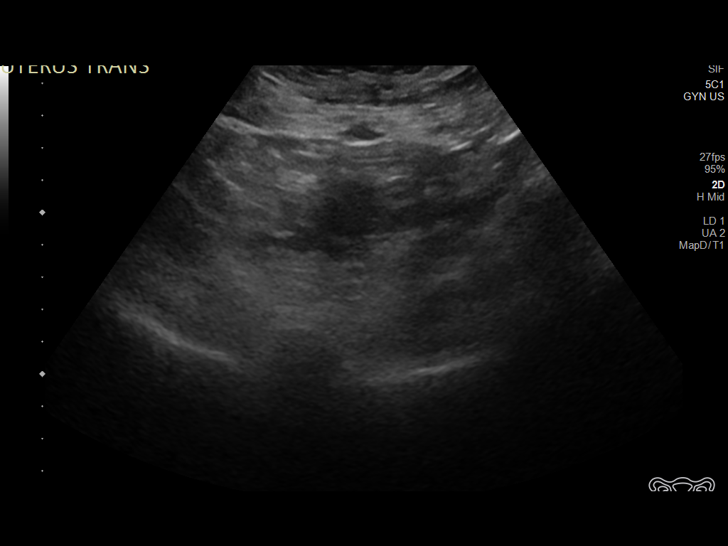
[im 19/113]
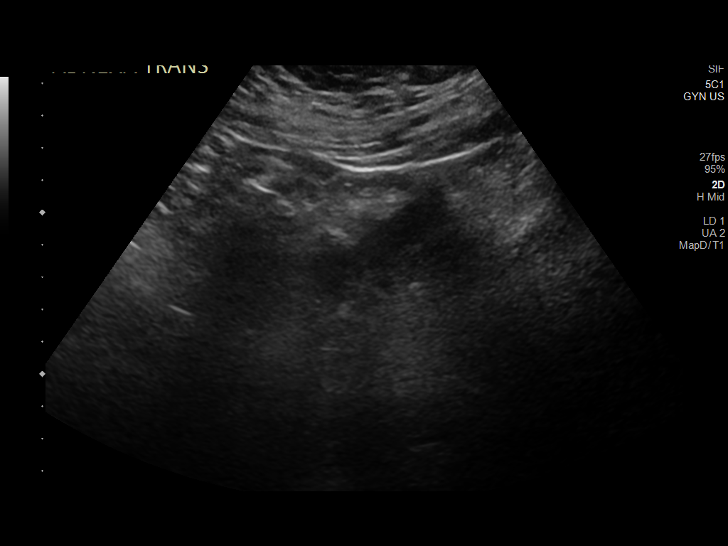
[im 29/113]
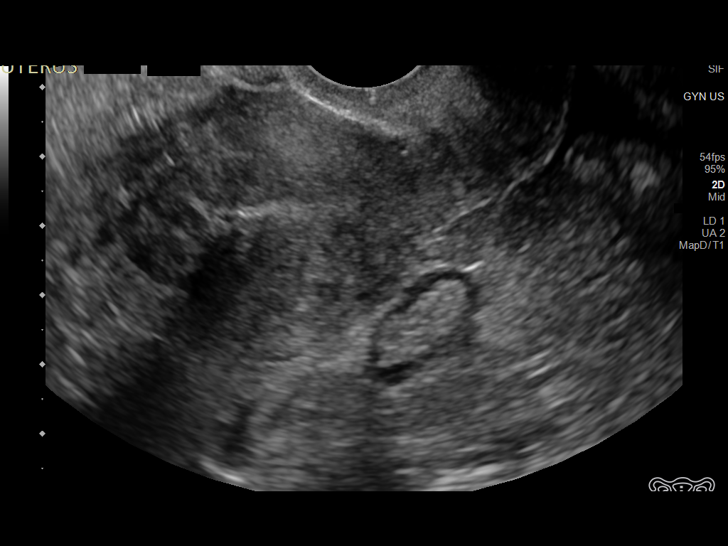
[im 38/113]
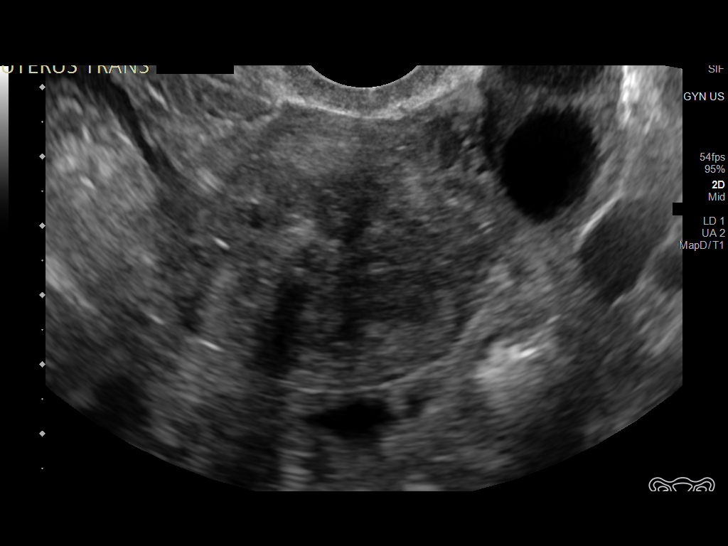
[im 47/113]
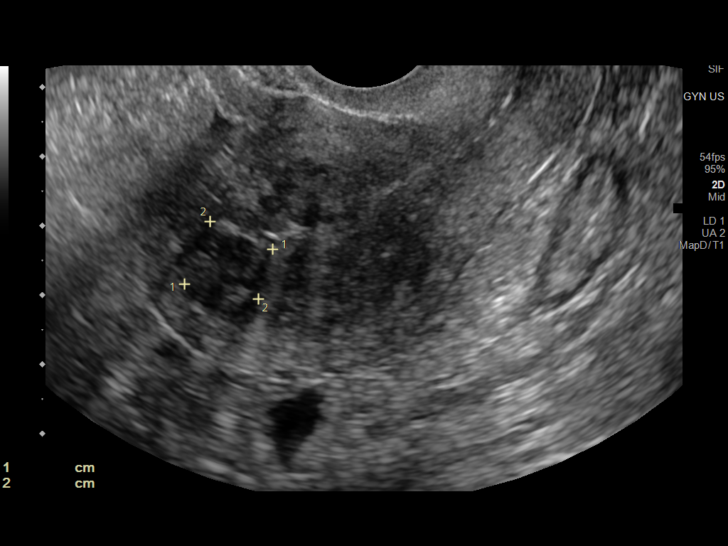
[im 57/113]
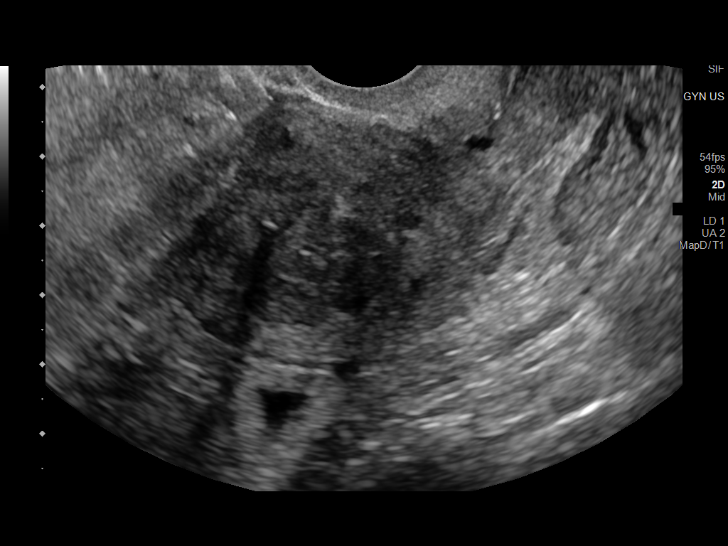
[im 66/113]
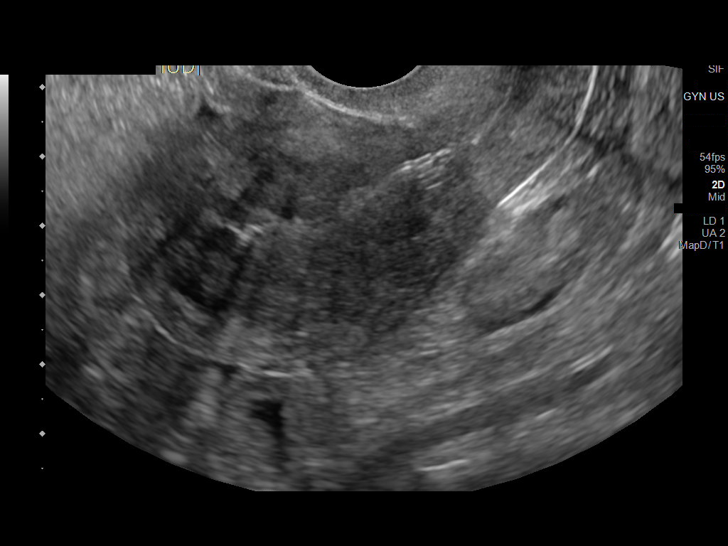
[im 75/113]
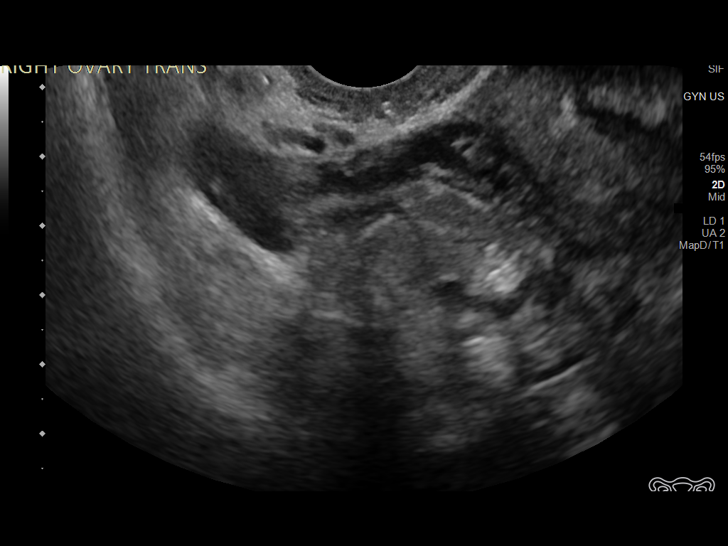
[im 85/113]
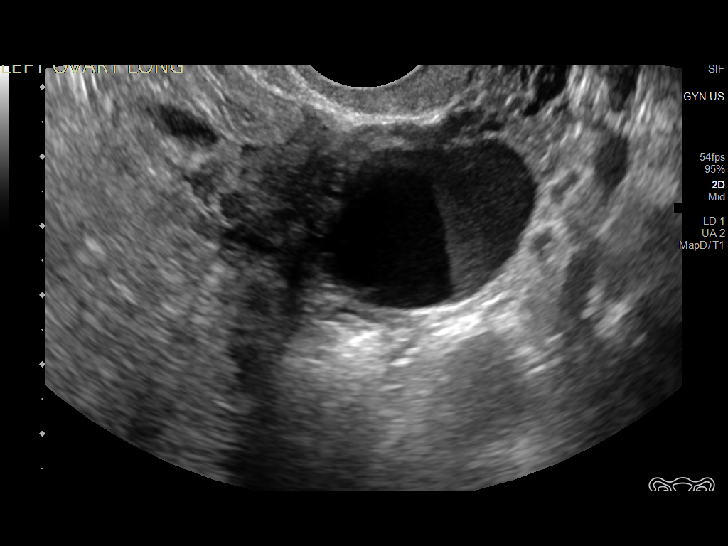
[im 94/113]
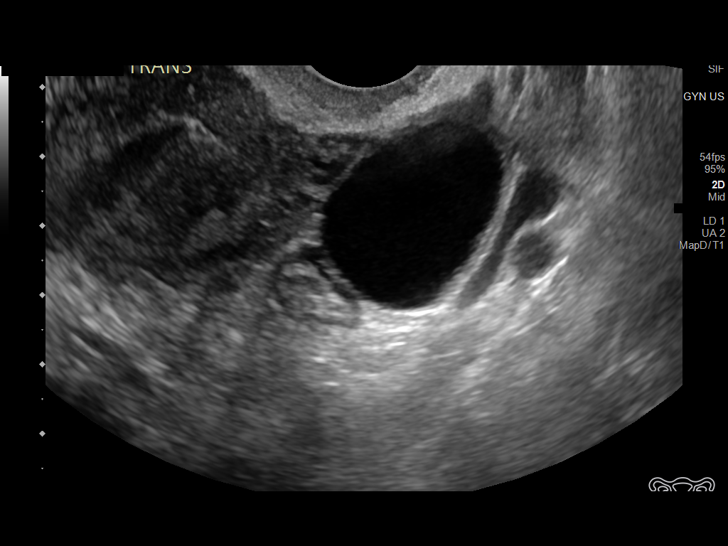
[im 103/113]
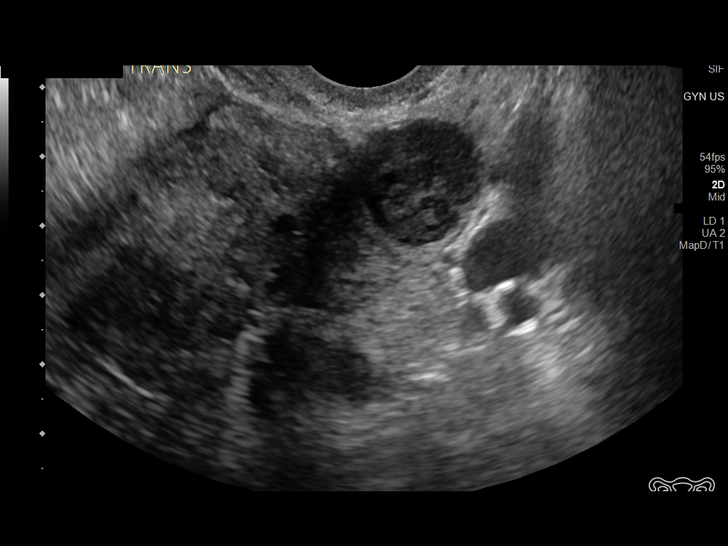
[im 113/113]
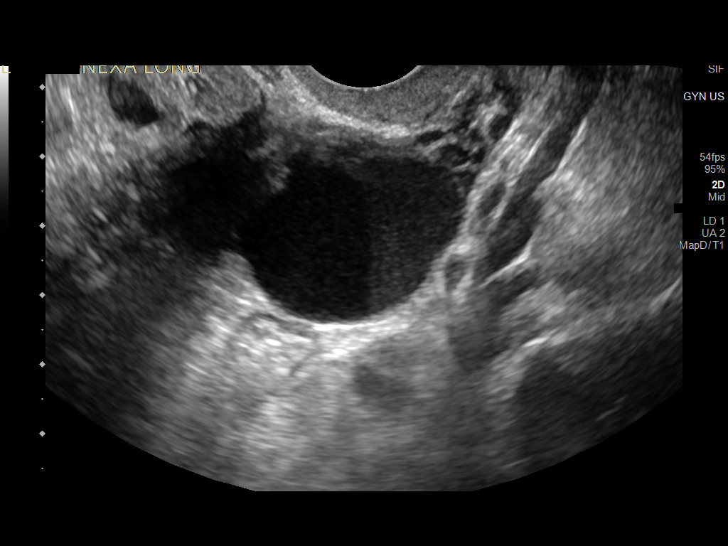

[13 of 25 positions shown; findings below may reference images not displayed]

FINDINGS: Uterus

Measurements: 7.5 x 3.7 x 4.5 cm = volume: 65.5 mL. Multiple small
uterine fibroids. Posterior fundal intramural fibroid measures 14 x
13 by 15 mm. Submucosal mid anterior fibroid measures 12 x 15 x 11
mm. Left submucosal fibroid measures 19 x 19 x 16 mm, previously 2 x
31 x 31 mm. Intramural lower uterine segment fibroid measures 7 x 6
x 6 mm.

Endometrium

Thickness: 5 mm.  No focal abnormality visualized.

Right ovary

Measurements: 2.4 x 1.4 x 2.1 cm = volume: 3.5 mL. Normal
appearance/no adnexal mass.

Left ovary

Measurements: 3.8 x 2.2 x 2.3 cm = volume: 10.1 mL. Complex cyst in
the left ovary with fluid fluid level measuring 31 x 23 by 29 mm.
Smaller complex cyst measuring 19 by 19 x 18 mm.

Other findings

Trace free fluid.
IMPRESSION: 1. Multiple small uterine fibroids, some of these were not
previously measured/identified. The largest fibroid measures 19 mm
and appears slightly smaller compared to the prior pelvic ultrasound
2. Complex left ovarian cyst measuring up to 31 mm, indeterminate
but probably benign and potentially representing hemorrhagic cysts
or possible endometrioma. Recommend 6-12 week sonographic follow-up
3. Trace free fluid.

## 2022-05-19 NOTE — Progress Notes (Deleted)
Office Visit Note  Patient: Rachel Mckinney             Date of Birth: 1976-11-12           MRN: [id number]             PCP: Eppie Gibson, MD Referring: Eppie Gibson, MD Visit Date: 06/01/2022   Subjective:  No chief complaint on file.   History of Present Illness: Rachel Mckinney is a 46 y.o. female here for follow up for rheumatoid arthritis and hidradenitis suppurativa on remicade 800 mg IV q8wks   Previous HPI 03/01/2022 Rachel Mckinney is a 46 y.o. female here to establish care for rheumatoid arthritis and hidradenitis suppurativa on remicade 800 mg IV q8wks. She also has a history of HTN, anemia, depression, and lymphadenopathy. She is transferring care from [facility] with Dr. Graylin Shiver due to change in insurance status. She has a longstanding history of HS previously treated with antibiotics and surgeries and was on Humira prior to remicade, with incomplete symptom improvement. She started this 2021 with interruption then Remicade was restarted since 03/2021 with large improvement in left wrist inflammation. She had her last infusion late November, was scheduled late January but missed this due to needing to transfer care. She feels increasing symptoms since being overdue for treatment joint pain in left elbow, wrist, and fingers also painful swelling of her right great toe. She feels extremely stiff in the base of her neck and upper back. Skin rashes have not flared up too much yet.     Imaging reviewed MRI of the left wrist 11/09/2020 "IMPRESSION:  1. Severe synovitis throughout the radiocarpal joint, midcarpal row, carpometacarpal articulation, and at the articulation between the triquetrum and pisiform bone. Erosions in ill-defined edema along portions of the carpus and metacarpal bases associated with the striking synovitis. Overall findings are highly suspicious for an inflammatory or crystalline arthropathy such as rheumatoid arthritis. Infectious arthropathy is  considered less likely given the multi joint involvement, although is not totally excluded.  2. Tenosynovitis and peritendinitis involving the extensor compartments and flexor tendons.  3. There is some local expansion and edema in the ulnar nerve at the level of the radiocarpal joint, significance uncertain. No obvious impinging lesion is observed in this vicinity.  4. Circumferential subcutaneous edema in the wrist, possibly with some low-grade enhancement-cellulitis not excluded. "      No Rheumatology ROS completed.   PMFS History:  Patient Active Problem List   Diagnosis Date Noted   High risk medication use 03/01/2022   Fibroids 02/14/2022   IUD (intrauterine device) in place 02/14/2022   Pap smear abnormality of cervix with LGSIL 01/18/2022   Passage of bloody stools 10/20/2021   Anemia 03/12/2021   Depression, major, single episode, severe ([place]) 01/13/2021   Seropositive rheumatoid arthritis ([place]) 11/20/2020   Inflammatory arthritis 11/20/2020   Sedimentation rate elevation 11/10/2020   Polyarthralgia 09/10/2020   History of ANA positive 11/18/2018   Tobacco abuse 05/12/2015   HTN (hypertension) 05/12/2015   Hidradenitis suppurativa-chronic and involving multiple sites 02/10/2012   Morbid obesity ([place]) 11/16/2011    Past Medical History:  Diagnosis Date   Abnormal finding on CT scan of Pelvis 11/10/2020   1. Infiltration in the fat of the low pelvis anteriorly, involving the vulva and right groin. Mild skin thickening. These changes are likely to represent cellulitis. No definite abscess collection identified. 2. Posteriorly along the gluteal crease, there is skin thickening and adjacent  fatty induration possibly representing cellulitis or fistula. No loculated abscess cavity identified.   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 Added automatically from request for surgery [id number] Formatting of this note might be different from the original. Added automatically from request for surgery [id number]   Left medial elbow lymphadenopathy 11/10/2020   See Venous doppler left arm 11/09/20   Left wrist pain 11/10/2020   Polyarthralgia    Possible Peroneal tendonitis of left lower leg 2018   Dx by Podiatry   Presence of (intrauterine) contraceptive device 06/29/2020    Family History  Problem Relation Age of Onset   Liver disease Father    Hypertension Father    Hypertension Sister    Past Surgical History:  Procedure Laterality Date   AXILLARY HIDRADENITIS EXCISION     CESAREAN SECTION     multiple abscess drainages     VULVECTOMY PARTIAL Left 02/21/2020   [facility]; Dorien Chihuahua, MD   Social History   Social History Narrative   Not on file   Immunization History  Administered Date(s) Administered   Influenza Split 09/03/2012   Influenza Whole 10/11/2007   Influenza,inj,Quad PF,6+ Mos 10/20/2021   Td 09/19/2003     Objective: Vital Signs: There were no vitals taken for this visit.   Physical Exam   Musculoskeletal Exam: ***  CDAI Exam: CDAI Score: -- Patient Global: --; Provider Global: -- Swollen: --; Tender: -- Joint Exam 06/01/2022   No joint exam has been documented for this visit   There is currently no information documented on the homunculus. Go to the Rheumatology activity and complete the homunculus joint exam.  Investigation: No additional findings.  Imaging: No results found.  Recent Labs: Lab Results  Component Value Date   WBC 8.3 03/01/2022   HGB 12.5 03/01/2022    PLT 326 03/01/2022   NA 140 03/01/2022   K 4.4 03/01/2022   CL 105 03/01/2022   CO2 26 03/01/2022   GLUCOSE 91 03/01/2022   BUN 17 03/01/2022   CREATININE 0.93 03/01/2022   BILITOT 0.4 03/01/2022   ALKPHOS 89 11/10/2020   AST 13 03/01/2022   ALT 8 03/01/2022   PROT 7.6 03/01/2022   ALBUMIN 2.6 (L) 11/10/2020   CALCIUM 8.8 03/01/2022   GFRAA 106 11/16/2020   QFTBGOLDPLUS NEGATIVE 03/01/2022    Speciality Comments: No specialty comments available.  Procedures:  No procedures performed Allergies: Patient has no known allergies.   Assessment / Plan:     Visit Diagnoses: No diagnosis found.  ***  Orders: No orders of the defined types were placed in this encounter.  No orders of the defined types were placed in this encounter.    Follow-Up Instructions: No follow-ups on file.   Earnestine Mealing, CMA  Note - This record has been created using Editor, commissioning.  Chart creation errors have been sought, but may not always  have been located. Such creation errors do not reflect on  the standard of medical care.

## 2022-05-24 ENCOUNTER — Encounter: Payer: Self-pay | Admitting: *Deleted

## 2022-05-30 ENCOUNTER — Encounter: Payer: Self-pay | Admitting: Internal Medicine

## 2022-05-31 ENCOUNTER — Ambulatory Visit (INDEPENDENT_AMBULATORY_CARE_PROVIDER_SITE_OTHER): Payer: 59

## 2022-05-31 VITALS — BP 135/83 | HR 73 | Temp 98.4°F | Resp 18 | Ht 65.0 in | Wt 214.6 lb

## 2022-05-31 DIAGNOSIS — M059 Rheumatoid arthritis with rheumatoid factor, unspecified: Secondary | ICD-10-CM

## 2022-05-31 DIAGNOSIS — L732 Hidradenitis suppurativa: Secondary | ICD-10-CM

## 2022-05-31 DIAGNOSIS — M1990 Unspecified osteoarthritis, unspecified site: Secondary | ICD-10-CM | POA: Diagnosis not present

## 2022-05-31 DIAGNOSIS — Z79899 Other long term (current) drug therapy: Secondary | ICD-10-CM

## 2022-05-31 LAB — COMPREHENSIVE METABOLIC PANEL
ALT: 16 U/L (ref 0–35)
AST: 18 U/L (ref 0–37)
Albumin: 3.9 g/dL (ref 3.5–5.2)
Alkaline Phosphatase: 83 U/L (ref 39–117)
BUN: 12 mg/dL (ref 6–23)
CO2: 28 mEq/L (ref 19–32)
Calcium: 8.9 mg/dL (ref 8.4–10.5)
Chloride: 105 mEq/L (ref 96–112)
Creatinine, Ser: 0.93 mg/dL (ref 0.40–1.20)
GFR: 73.37 mL/min (ref >60.00)
Glucose, Bld: 80 mg/dL (ref 70–99)
Potassium: 3.3 mEq/L — ABNORMAL LOW (ref 3.5–5.1)
Sodium: 139 mEq/L (ref 135–145)
Total Bilirubin: 0.4 mg/dL (ref 0.2–1.2)
Total Protein: 7.2 g/dL (ref 6.0–8.3)

## 2022-05-31 LAB — CBC WITH DIFFERENTIAL/PLATELET
Basophils Absolute: 0 10*3/uL (ref 0.0–0.1)
Basophils Relative: 0.6 % (ref 0.0–3.0)
Eosinophils Absolute: 0.2 10*3/uL (ref 0.0–0.7)
Eosinophils Relative: 3.1 % (ref 0.0–5.0)
HCT: 38.5 % (ref 36.0–46.0)
Hemoglobin: 12.7 g/dL (ref 12.0–15.0)
Lymphocytes Relative: 35.6 % (ref 12.0–46.0)
Lymphs Abs: 1.9 10*3/uL (ref 0.7–4.0)
MCHC: 33 g/dL (ref 30.0–36.0)
MCV: 98.7 fl (ref 78.0–100.0)
Monocytes Absolute: 0.5 10*3/uL (ref 0.1–1.0)
Monocytes Relative: 9 % (ref 3.0–12.0)
Neutro Abs: 2.8 10*3/uL (ref 1.4–7.7)
Neutrophils Relative %: 51.7 % (ref 43.0–77.0)
Platelets: 232 10*3/uL (ref 150.0–400.0)
RBC: 3.9 Mil/uL (ref 3.87–5.11)
RDW: 15.2 % (ref 11.5–15.5)
WBC: 5.4 10*3/uL (ref 4.0–10.5)

## 2022-05-31 MED ORDER — ACETAMINOPHEN 325 MG PO TABS
650.0000 mg | ORAL_TABLET | Freq: Once | ORAL | Status: AC
  Administered 2022-05-31: 650 mg via ORAL
  Filled 2022-05-31: qty 2

## 2022-05-31 MED ORDER — DIPHENHYDRAMINE HCL 25 MG PO CAPS
25.0000 mg | ORAL_CAPSULE | Freq: Once | ORAL | Status: AC
  Administered 2022-05-31: 25 mg via ORAL
  Filled 2022-05-31: qty 1

## 2022-05-31 MED ORDER — SODIUM CHLORIDE 0.9 % IV SOLN
800.0000 mg | INTRAVENOUS | Status: DC
  Administered 2022-05-31: 800 mg via INTRAVENOUS
  Filled 2022-05-31: qty 80

## 2022-05-31 NOTE — Progress Notes (Signed)
Diagnosis: Rheumatoid arthritis  Provider:  Marshell Garfinkel, MD  Procedure: Infusion  IV Type: Peripheral, IV Location: R Antecubital  Remicade (Infliximab), Dose: '800mg'$   Infusion Start Time: 1136  Infusion Stop Time: 3005  Post Infusion IV Care: Peripheral IV Discontinued  Discharge: Condition: Good, Destination: Home . AVS provided to patient.   Performed by:  Arnoldo Morale, RN

## 2022-06-01 ENCOUNTER — Ambulatory Visit: Payer: 59 | Admitting: Internal Medicine

## 2022-06-01 DIAGNOSIS — L732 Hidradenitis suppurativa: Secondary | ICD-10-CM

## 2022-06-01 DIAGNOSIS — Z79899 Other long term (current) drug therapy: Secondary | ICD-10-CM

## 2022-06-01 DIAGNOSIS — M059 Rheumatoid arthritis with rheumatoid factor, unspecified: Secondary | ICD-10-CM

## 2022-06-06 ENCOUNTER — Telehealth: Payer: Self-pay | Admitting: Gastroenterology

## 2022-06-06 ENCOUNTER — Encounter: Payer: Self-pay | Admitting: Physician Assistant

## 2022-06-06 NOTE — Telephone Encounter (Signed)
Patients records for review are in epic.  Patient would like a transfer of care because [facility] does not take her insurance.  Thank You

## 2022-06-06 NOTE — Telephone Encounter (Signed)
She can be seen in the next available new patient slot with any physician or APP.  - HD

## 2022-06-07 ENCOUNTER — Ambulatory Visit (INDEPENDENT_AMBULATORY_CARE_PROVIDER_SITE_OTHER): Payer: 59 | Admitting: Internal Medicine

## 2022-06-07 ENCOUNTER — Encounter: Payer: Self-pay | Admitting: Internal Medicine

## 2022-06-07 VITALS — BP 165/85 | HR 56 | Resp 16 | Ht 65.0 in | Wt 215.6 lb

## 2022-06-07 DIAGNOSIS — M059 Rheumatoid arthritis with rheumatoid factor, unspecified: Secondary | ICD-10-CM | POA: Diagnosis not present

## 2022-06-07 DIAGNOSIS — L732 Hidradenitis suppurativa: Secondary | ICD-10-CM | POA: Diagnosis not present

## 2022-06-07 DIAGNOSIS — Z79899 Other long term (current) drug therapy: Secondary | ICD-10-CM

## 2022-06-07 NOTE — Progress Notes (Signed)
Office Visit Note  Patient: Rachel Mckinney             Date of Birth: 08-Mar-1976           MRN: [id number]             PCP: Eppie Gibson, MD Referring: Eppie Gibson, MD Visit Date: 06/07/2022   Subjective:  Follow-up (Doing good)   History of Present Illness: Rachel Mckinney is a 46 y.o. female here for follow up for RA and HS on remicade 800 mg IV q8wks. She continues to have joint pains worst on her left hand and wrist. 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 She has tingling sensation affecting the right great toe intermittently now too. She has not seen as much swelling in the foot compared to before. Skin disease is well controlled. No new infections or major medical events.   Previous HPI 03/01/22 Rachel Mckinney is a 46 y.o. female here to establish care for rheumatoid arthritis and hidradenitis suppurativa on remicade 800 mg IV q8wks. She also has a history of HTN, anemia, depression, and lymphadenopathy. She is transferring care from [facility] with Dr. Graylin Shiver due to change in insurance status. She has a longstanding history of HS previously treated with antibiotics and surgeries and was on Humira prior to remicade, with incomplete symptom improvement. She started this 2021 with interruption then Remicade was restarted since 03/2021 with large improvement in left wrist inflammation. She had her last infusion late November, was scheduled late January but missed this due to needing to transfer care. She feels increasing symptoms since being overdue for treatment joint pain in left elbow, wrist, and fingers also painful swelling of her right great toe. She feels extremely stiff in the base of her neck and upper back. Skin rashes have not flared up too much yet.     Imaging reviewed MRI of the left wrist 11/09/2020 "IMPRESSION:  1. Severe synovitis throughout the radiocarpal joint, midcarpal row, carpometacarpal articulation, and at the articulation  between the triquetrum and pisiform bone. Erosions in ill-defined edema along portions of the carpus and metacarpal bases associated with the striking synovitis. Overall findings are highly suspicious for an inflammatory or crystalline arthropathy such as rheumatoid arthritis. Infectious arthropathy is considered less likely given the multi joint involvement, although is not totally excluded.  2. Tenosynovitis and peritendinitis involving the extensor compartments and flexor tendons.  3. There is some local expansion and edema in the ulnar nerve at the level of the radiocarpal joint, significance uncertain. No obvious impinging lesion is observed in this vicinity.  4. Circumferential subcutaneous edema in the wrist, possibly with some low-grade enhancement-cellulitis not excluded. "    Review of Systems  Constitutional:  Positive for fatigue.  HENT:  Positive for mouth dryness.   Eyes:  Positive for itching.  Respiratory:  Negative for shortness of breath.   Cardiovascular:  Positive for swelling in legs/feet.  Gastrointestinal:  Positive for constipation.  Endocrine: Positive for cold intolerance, heat intolerance and excessive thirst.  Genitourinary:  Negative for difficulty urinating.  Musculoskeletal:  Positive for morning stiffness and muscle tenderness.  Skin:  Negative for rash.  Allergic/Immunologic: Negative for susceptible to infections.  Neurological:  Positive for numbness and weakness.  Hematological:  Negative for bruising/bleeding tendency.  Psychiatric/Behavioral:  Positive for sleep disturbance.     PMFS History:  Patient Active Problem List   Diagnosis Date Noted   High risk  medication use 03/01/2022   Fibroids 02/14/2022   IUD (intrauterine device) in place 02/14/2022   Pap smear abnormality of cervix with LGSIL 01/18/2022   Passage of bloody stools 10/20/2021   Anemia 03/12/2021   Depression, major, single episode, severe ([place]) 01/13/2021   Seropositive rheumatoid  arthritis ([place]) 11/20/2020   Inflammatory arthritis 11/20/2020   Sedimentation rate elevation 11/10/2020   Polyarthralgia 09/10/2020   History of ANA positive 11/18/2018   Tobacco abuse 05/12/2015   HTN (hypertension) 05/12/2015   Hidradenitis suppurativa-chronic and involving multiple sites 02/10/2012   Morbid obesity ([place]) 11/16/2011    Past Medical History:  Diagnosis Date   Abnormal finding on CT scan of Pelvis 11/10/2020   1. Infiltration in the fat of the low pelvis anteriorly, involving the vulva and right groin. Mild skin thickening. These changes are likely to represent cellulitis. No definite abscess collection identified. 2. Posteriorly along the gluteal crease, there is skin thickening and adjacent fatty induration possibly representing cellulitis or fistula. No loculated abscess cavity identified.   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 Added automatically from request for surgery [id number] Formatting of this note might be different from the original. Added automatically from request for surgery [id number]   Left medial elbow lymphadenopathy 11/10/2020   See Venous doppler left arm 11/09/20   Left wrist pain 11/10/2020   Polyarthralgia    Possible Peroneal tendonitis of left lower leg 2018   Dx by Podiatry   Presence of (intrauterine) contraceptive device 06/29/2020    Family History  Problem Relation Age of Onset   Liver disease Father    Hypertension Father    Hypertension Sister    Past Surgical History:  Procedure Laterality  Date   AXILLARY HIDRADENITIS EXCISION     CESAREAN SECTION     multiple abscess drainages     VULVECTOMY PARTIAL Left 02/21/2020   [facility]; Dorien Chihuahua, MD   Social History   Social History Narrative   Not on file   Immunization History  Administered Date(s) Administered   Influenza Split 09/03/2012   Influenza Whole 10/11/2007   Influenza,inj,Quad PF,6+ Mos 10/20/2021   Td 09/19/2003     Objective: Vital Signs: BP (!) 165/85 (BP Location: Left Arm, Patient Position: Sitting, Cuff Size: Normal)   Pulse (!) 56   Resp 16   Ht '5\' 5"'$  (1.651 m)   Wt 215 lb 9.6 oz (97.8 kg)   BMI 35.88 kg/m    Physical Exam Constitutional:      Appearance: She is obese.  Cardiovascular:     Rate and Rhythm: Normal rate and regular rhythm.  Pulmonary:     Effort: Pulmonary effort is normal.     Breath sounds: Normal breath sounds.  Skin:    General: Skin is warm and dry.  Neurological:     Mental Status: She is alert.  Psychiatric:        Mood and Affect: Mood normal.      Musculoskeletal Exam:  Shoulders full ROM no tenderness or swelling Elbows full  ROM left elbow tenderness to pressure without swelling Wrists full ROM left wrist soft tissue swelling at ulnar side and ganglion cyst over extensor tendon towards 4th finger Fingers full ROM, left hand mild lateral finger deviation Knees full ROM no tenderness or swelling Ankles full ROM no tenderness or swelling Right 1st MTP mild bony changes, callus on medial edge, no palpable swelling   CDAI Exam: CDAI Score: 9  Patient Global: 30 mm; Provider Global: 30 mm Swollen: 1 ; Tender: 3  Joint Exam 06/07/2022      Right  Left  Elbow     Swollen Tender  Wrist      Tender  MTP 1   Tender        Investigation: No additional findings.  Imaging: No results found.  Recent Labs: Lab Results  Component Value Date   WBC 5.4 05/31/2022   HGB 12.7 05/31/2022   PLT 232.0 05/31/2022   NA 139 05/31/2022   K 3.3 (L)  05/31/2022   CL 105 05/31/2022   CO2 28 05/31/2022   GLUCOSE 80 05/31/2022   BUN 12 05/31/2022   CREATININE 0.93 05/31/2022   BILITOT 0.4 05/31/2022   ALKPHOS 83 05/31/2022   AST 18 05/31/2022   ALT 16 05/31/2022   PROT 7.2 05/31/2022   ALBUMIN 3.9 05/31/2022   CALCIUM 8.9 05/31/2022   GFRAA 106 11/16/2020   QFTBGOLDPLUS NEGATIVE 03/01/2022    Speciality Comments: No specialty comments available.  Procedures:  No procedures performed Allergies: Patient has no known allergies.   Assessment / Plan:     Visit Diagnoses: Seropositive rheumatoid arthritis ([place]) - Plan: Sedimentation rate, C-reactive protein  Inflammatory arthritis doing pretty well on the Remicade infusion.  Some pain and swelling in the left wrist and elbow but may also be use related.  We will check sed rate and CRP for disease activity monitoring.  Plan to continue the Remicade 800 mg IV every 8 weeks.  Hidradenitis suppurativa-chronic and involving multiple sites  Predominantly inguinal distribution.  Did have a course of doxycycline within the past few months.  High risk medication use  CBC and CMP recently checked with the Remicade infusions reviewed today these look fine for continuing medication.  No infusion reactions or infections requiring antibiotic treatment.  Orders: Orders Placed This Encounter  Procedures   Sedimentation rate   C-reactive protein   No orders of the defined types were placed in this encounter.    Follow-Up Instructions: Return in about 3 months (around 09/07/2022) for RA/HS on INF f/u 30mo.   CCollier [place] MD  Note - This record has been created using [employer]  Chart creation errors have been sought, but may not always  have been located. Such creation errors do not reflect on  the standard of medical care.

## 2022-06-08 LAB — SEDIMENTATION RATE: Sed Rate: 6 mm/h (ref 0–20)

## 2022-06-08 LAB — C-REACTIVE PROTEIN: CRP: 0.6 mg/L

## 2022-06-08 NOTE — Progress Notes (Signed)
Inflammatory labs are completely improved. Sed rate is 6 down from 36 in March and CRP is 0.6 down from 29.5 in March. So I suspect the current cysts or tendon swelling may be more use related.

## 2022-06-08 NOTE — Progress Notes (Signed)
Is she still having blood with bowel movements? If there is currently bleeding I'd have to recommend against taking naproxen if possible since this can contribute to bleeding. If that problem is resolved would be okay, could take naproxen 500 mg up to 1-2 times daily PRN for continued joint pains.

## 2022-06-20 ENCOUNTER — Encounter: Payer: Self-pay | Admitting: Internal Medicine

## 2022-06-30 ENCOUNTER — Ambulatory Visit: Payer: 59 | Admitting: Gastroenterology

## 2022-06-30 ENCOUNTER — Encounter: Payer: Self-pay | Admitting: Gastroenterology

## 2022-06-30 VITALS — BP 150/84 | HR 72 | Ht 64.0 in | Wt 213.1 lb

## 2022-06-30 DIAGNOSIS — K625 Hemorrhage of anus and rectum: Secondary | ICD-10-CM

## 2022-06-30 DIAGNOSIS — R195 Other fecal abnormalities: Secondary | ICD-10-CM | POA: Diagnosis not present

## 2022-06-30 MED ORDER — NA SULFATE-K SULFATE-MG SULF 17.5-3.13-1.6 GM/177ML PO SOLN: 1.0000 | Freq: Once | ORAL | 0 refills | Status: AC

## 2022-06-30 NOTE — Patient Instructions (Signed)
You have been scheduled for a colonoscopy. Please follow written instructions given to you at your visit today.  Please pick up your prep supplies at the pharmacy within the next 1-3 days. If you use inhalers (even only as needed), please bring them with you on the day of your procedure.  If you are age 47 or older, your body mass index should be between 23-30. Your Body mass index is 36.58 kg/m. If this is out of the aforementioned range listed, please consider follow up with your Primary Care Provider.  If you are age 41 or younger, your body mass index should be between 19-25. Your Body mass index is 36.58 kg/m. If this is out of the aformentioned range listed, please consider follow up with your Primary Care Provider.   ________________________________________________________  The [facility] providers would like to encourage you to use [facility] to communicate with providers for non-urgent requests or questions.  Due to long hold times on the telephone, sending your provider a message by [facility] may be a faster and more efficient way to get a response.  Please allow 48 business hours for a response.  Please remember that this is for non-urgent requests.  _______________________________________________________

## 2022-06-30 NOTE — Progress Notes (Signed)
06/30/2022 Rachel Mckinney [id number] [birth year]   HISTORY OF PRESENT ILLNESS: This is a 46 year old female who is new to our practice.  She has been referred here by Dr. Dorris Singh for evaluation regarding bloody stools.  Patient tells me that she started having this rectal bleeding in December 2022.  She was seen by GI at a different facility and scheduled for colonoscopy for January.  She was then told that they no longer accept her insurance so she never got to proceed with colonoscopy.  She has continued to have red blood and mucus with every bowel movement pretty much since December.  Said she had 2 episodes with larger amounts of blood.  Says that it only occurs with bowel movements.  Has been on Remicade for the past 2 years for her RA and hidradenitis.  She says that her bowel movements used to be really regular.  She has had some mild constipation recently, but no diarrhea.  No definite family history of colon cancer, but says that her dad had a lot of issues with his colon, but unsure of details and if it was malignancy.   Past Medical History:  Diagnosis Date   Abnormal finding on CT scan of Pelvis 11/10/2020   1. Infiltration in the fat of the low pelvis anteriorly, involving the vulva and right groin. Mild skin thickening. These changes are likely to represent cellulitis. No definite abscess collection identified. 2. Posteriorly along the gluteal crease, there is skin thickening and adjacent fatty induration possibly representing cellulitis or fistula. No loculated abscess cavity identified.   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 Added automatically from request for surgery [id number] Formatting of this note might be different from the original. Added automatically from request for surgery [id number]   Left medial elbow lymphadenopathy 11/10/2020   See Venous doppler left arm 11/09/20   Left wrist pain 11/10/2020   Polyarthralgia    Possible Peroneal tendonitis of left lower leg 2018   Dx by Podiatry   Presence of (intrauterine) contraceptive device 06/29/2020   Rheumatoid arthritis ([place])    Past Surgical History:  Procedure Laterality Date   AXILLARY HIDRADENITIS EXCISION     CESAREAN SECTION     multiple abscess drainages     VULVECTOMY PARTIAL Left 02/21/2020   [facility]; Dorien Chihuahua, MD    reports that she has been smoking cigarettes. She has a 1.00 pack-year smoking history. She has never used smokeless tobacco. She reports current alcohol use of about 10.0 standard drinks of alcohol per week. She reports that she does not currently use drugs. family history includes Diabetes in her paternal grandfather; Fibroids in her sister, sister, sister, and sister; Hypertension in her father, paternal grandfather, sister, sister, and sister; Kidney disease in her paternal uncle; Liver disease in her father. No Known Allergies    Outpatient Encounter Medications as of 06/30/2022  Medication Sig   acetaminophen (TYLENOL) 325 MG tablet Prior to Remicade infusion   cholecalciferol (VITAMIN D3) 25 MCG (1000 UNIT) tablet  Take 1,000 Units by mouth daily.   Cholecalciferol 50 MCG (2000 UT) CAPS Take by mouth.   hydrochlorothiazide (HYDRODIURIL) 25 MG tablet TAKE 1 TABLET(25 MG) BY MOUTH DAILY   inFLIXimab (REMICADE) 100 MG injection Inject into the vein.   Multiple Vitamin (MULTI-VITAMIN) tablet Take 1 tablet by mouth daily.   Multiple Vitamins-Minerals (ONE DAILY CALCIUM/IRON) TABS Take 1 tablet by mouth daily.    naproxen (NAPROSYN) 500 MG tablet Take 1 tablet (500 mg total) by mouth 2 (two) times daily as needed for moderate pain.   Turmeric 500 MG CAPS Take by mouth.   megestrol (MEGACE) 40 MG tablet Take 1 tablet (40 mg total) by mouth daily. Can increase to two tablets twice a day in the event of heavy bleeding (Patient not taking: Reported on 03/01/2022)   [DISCONTINUED] doxycycline (VIBRA-TABS) 100 MG tablet Take 1 tablet (100 mg total) by mouth 2 (two) times daily. (Patient not taking: Reported on 06/07/2022)   [DISCONTINUED] naproxen (NAPROSYN) 500 MG tablet Take by mouth. (Patient not taking: Reported on 03/01/2022)   No facility-administered encounter medications on file as of 06/30/2022.     REVIEW OF SYSTEMS  : All other systems reviewed and negative except where noted in the History of Present Illness.   PHYSICAL EXAM: BP (!) 150/84 (BP Location: Left Arm, Patient Position: Sitting, Cuff Size: Normal)   Pulse 72   Ht '5\' 4"'$  (1.626 m) Comment: height measured without shoes  Wt 213 lb 2 oz (96.7 kg)   BMI 36.58 kg/m  General: Well developed female in no acute distress Head: Normocephalic and atraumatic Eyes:  Sclerae anicteric, conjunctiva pink. Ears: Normal auditory acuity Lungs: Clear throughout to auscultation; no W/R/R. Heart: Regular rate and rhythm; no M/R/G. Abdomen: Soft, non-distended.  BS present.  Non-tender. Rectal:  Will be done at the time of colonoscopy. Musculoskeletal: Symmetrical with no gross deformities  Skin: No lesions on visible extremities Extremities: No edema  Neurological: Alert oriented x 4, grossly non-focal Psychological:  Alert and cooperative. Normal mood and affect  ASSESSMENT AND PLAN: *Rectal bleeding and mucus in stool: This has been ongoing daily with bowel movements since about December.  Never had colonoscopy in the past.  We will schedule for colonoscopy with Dr. Rush Landmark.  The risks, benefits, and alternatives to colonoscopy were discussed  with the patient and she consents to proceed.   CC:  Eppie Gibson, MD CC:  Dr. Dorris Singh

## 2022-06-30 NOTE — Progress Notes (Signed)
Attending Physician's Attestation   I have reviewed the chart.   I agree with the Advanced Practitioner's note, impression, and recommendations with any updates as below.    Kiyoko Mcguirt Mansouraty, MD [facility] Advanced Endoscopy Office # [phone]  

## 2022-07-05 ENCOUNTER — Ambulatory Visit (AMBULATORY_SURGERY_CENTER): Payer: 59 | Admitting: Gastroenterology

## 2022-07-05 ENCOUNTER — Encounter: Payer: Self-pay | Admitting: Gastroenterology

## 2022-07-05 VITALS — BP 159/70 | HR 54 | Temp 96.9°F | Resp 13 | Ht 64.0 in | Wt 213.0 lb

## 2022-07-05 DIAGNOSIS — K625 Hemorrhage of anus and rectum: Secondary | ICD-10-CM

## 2022-07-05 DIAGNOSIS — D127 Benign neoplasm of rectosigmoid junction: Secondary | ICD-10-CM | POA: Diagnosis not present

## 2022-07-05 DIAGNOSIS — D122 Benign neoplasm of ascending colon: Secondary | ICD-10-CM

## 2022-07-05 DIAGNOSIS — R194 Change in bowel habit: Secondary | ICD-10-CM | POA: Diagnosis not present

## 2022-07-05 DIAGNOSIS — D128 Benign neoplasm of rectum: Secondary | ICD-10-CM

## 2022-07-05 DIAGNOSIS — K641 Second degree hemorrhoids: Secondary | ICD-10-CM

## 2022-07-05 DIAGNOSIS — D123 Benign neoplasm of transverse colon: Secondary | ICD-10-CM

## 2022-07-05 MED ORDER — SODIUM CHLORIDE 0.9 % IV SOLN: 500.0000 mL | Freq: Once | INTRAVENOUS | Status: DC

## 2022-07-05 NOTE — Progress Notes (Signed)
GASTROENTEROLOGY PROCEDURE H&P NOTE   Primary Care Physician: Eppie Gibson, MD  HPI: Rachel Mckinney is a 46 y.o. female who presents for Colonoscopy for colon cancer screening and issues of recent rectal bleeding and has history of RA and HS.  Past Medical History:  Diagnosis Date   Abnormal finding on CT scan of Pelvis 11/10/2020   1. Infiltration in the fat of the low pelvis anteriorly, involving the vulva and right groin. Mild skin thickening. These changes are likely to represent cellulitis. No definite abscess collection identified. 2. Posteriorly along the gluteal crease, there is skin thickening and adjacent fatty induration possibly representing cellulitis or fistula. No loculated abscess cavity identified.   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 Added automatically from request for surgery [id number] Formatting of this note might be different from the original. Added automatically from request for surgery [id number]   Left medial elbow lymphadenopathy 11/10/2020   See Venous doppler left arm 11/09/20   Left wrist pain 11/10/2020   Polyarthralgia    Possible Peroneal tendonitis of left lower leg 2018   Dx by Podiatry   Presence of (intrauterine) contraceptive device 06/29/2020   Rheumatoid arthritis ([place])    Past Surgical History:  Procedure Laterality Date   AXILLARY HIDRADENITIS EXCISION     CESAREAN SECTION     multiple abscess drainages     VULVECTOMY PARTIAL Left 02/21/2020   [facility]; Dorien Chihuahua, MD   Current  Outpatient Medications  Medication Sig Dispense Refill   acetaminophen (TYLENOL) 325 MG tablet Prior to Remicade infusion     cholecalciferol (VITAMIN D3) 25 MCG (1000 UNIT) tablet Take 1,000 Units by mouth daily.     Cholecalciferol 50 MCG (2000 UT) CAPS Take by mouth.     hydrochlorothiazide (HYDRODIURIL) 25 MG tablet TAKE 1 TABLET(25 MG) BY MOUTH DAILY 90 tablet 3   inFLIXimab (REMICADE) 100 MG injection Inject into the vein.     megestrol (MEGACE) 40 MG tablet Take 1 tablet (40 mg total) by mouth daily. Can increase to two tablets twice a day in the event of heavy bleeding (Patient not taking: Reported on 03/01/2022) 10 tablet 0   Multiple Vitamin (MULTI-VITAMIN) tablet Take 1 tablet by mouth daily.     Multiple Vitamins-Minerals (ONE DAILY CALCIUM/IRON) TABS Take 1 tablet by mouth daily.     naproxen (NAPROSYN) 500 MG tablet Take 1 tablet (500 mg total) by mouth 2 (two) times daily as needed for moderate pain. 60 tablet 0   Turmeric 500 MG CAPS Take by mouth.     No current facility-administered medications for this visit.    Current Outpatient Medications:    acetaminophen (TYLENOL) 325 MG tablet, Prior to Remicade infusion, Disp: , Rfl:    cholecalciferol (VITAMIN D3) 25 MCG (1000 UNIT) tablet, Take 1,000 Units by mouth daily., Disp: , Rfl:    Cholecalciferol 50 MCG (2000 UT) CAPS, Take by mouth., Disp: , Rfl:    hydrochlorothiazide (HYDRODIURIL) 25 MG tablet, TAKE 1 TABLET(25 MG) BY MOUTH DAILY, Disp:  90 tablet, Rfl: 3   inFLIXimab (REMICADE) 100 MG injection, Inject into the vein., Disp: , Rfl:    megestrol (MEGACE) 40 MG tablet, Take 1 tablet (40 mg total) by mouth daily. Can increase to two tablets twice a day in the event of heavy bleeding (Patient not taking: Reported on 03/01/2022), Disp: 10 tablet, Rfl: 0   Multiple Vitamin (MULTI-VITAMIN) tablet, Take 1 tablet by mouth daily., Disp: , Rfl:    Multiple Vitamins-Minerals (ONE DAILY CALCIUM/IRON) TABS, Take 1 tablet by mouth  daily., Disp: , Rfl:    naproxen (NAPROSYN) 500 MG tablet, Take 1 tablet (500 mg total) by mouth 2 (two) times daily as needed for moderate pain., Disp: 60 tablet, Rfl: 0   Turmeric 500 MG CAPS, Take by mouth., Disp: , Rfl:  No Known Allergies Family History  Problem Relation Age of Onset   Liver disease Father    Hypertension Father    Hypertension Sister    Fibroids Sister    Hypertension Sister    Fibroids Sister    Hypertension Sister    Fibroids Sister    Fibroids Sister    Diabetes Paternal Grandfather    Hypertension Paternal Grandfather    Kidney disease Paternal Uncle    Social History   Socioeconomic History   Marital status: Single    Spouse name: Not on file   Number of children: 1   Years of education: Not on file   Highest education level: Not on file  Occupational History   Occupation: hairdresser  Tobacco Use   Smoking status: Every Day    Packs/day: 0.10    Years: 10.00    Total pack years: 1.00    Types: Cigarettes   Smokeless tobacco: Never  Vaping Use   Vaping Use: Never used  Substance and Sexual Activity   Alcohol use: Yes    Alcohol/week: 10.0 standard drinks of alcohol    Types: 10 Shots of liquor per week    Comment: on weekends   Drug use: Not Currently   Sexual activity: Not on file  Other Topics Concern   Not on file  Social History Narrative   Not on file   Social Determinants of Health   Financial Resource Strain: Not on file  Food Insecurity: No Food Insecurity (01/05/2022)   Hunger Vital Sign    Worried About Running Out of Food in the Last Year: Never true    Ran Out of Food in the Last Year: Never true  Transportation Needs: No Transportation Needs (01/05/2022)   PRAPARE - Hydrologist (Medical): No    Lack of Transportation (Non-Medical): No  Physical Activity: Not on file  Stress: Not on file  Social Connections: Not on file  Intimate Partner Violence: Not on file    Physical Exam: There  were no vitals filed for this visit. There is no height or weight on file to calculate BMI. GEN: NAD EYE: Sclerae anicteric ENT: MMM CV: Non-tachycardic GI: Soft, NT/ND NEURO:  Alert & Oriented x 3  Lab Results: No results for input(s): "WBC", "HGB", "HCT", "PLT" in the last 72 hours. BMET No results for input(s): "NA", "K", "CL", "CO2", "GLUCOSE", "BUN", "CREATININE", "CALCIUM" in the last 72 hours. LFT No results for input(s): "PROT", "ALBUMIN", "AST", "ALT", "ALKPHOS", "BILITOT", "BILIDIR", "IBILI" in the last 72 hours. PT/INR No results for input(s): "LABPROT", "INR" in the last 72 hours.   Impression / Plan: This is a 46 y.o.female who presents  for Colonoscopy for colon cancer screening and issues of recent rectal bleeding and has history of RA and HS.  The risks and benefits of endoscopic evaluation/treatment were discussed with the patient and/or family; these include but are not limited to the risk of perforation, infection, bleeding, missed lesions, lack of diagnosis, severe illness requiring hospitalization, as well as anesthesia and sedation related illnesses.  The patient's history has been reviewed, patient examined, no change in status, and deemed stable for procedure.  The patient and/or family is agreeable to proceed.    Justice Britain, MD [facility] Advanced Endoscopy Office # [phone]

## 2022-07-05 NOTE — Patient Instructions (Signed)
YOU HAD AN ENDOSCOPIC PROCEDURE TODAY AT THE [facility]:   Refer to the procedure report that was given to you for any specific questions about what was found during the examination.  If the procedure report does not answer your questions, please call your gastroenterologist to clarify.  If you requested that your care partner not be given the details of your procedure findings, then the procedure report has been included in a sealed envelope for you to review at your convenience later.  YOU SHOULD EXPECT: Some feelings of bloating in the abdomen. Passage of more gas than usual.  Walking can help get rid of the air that was put into your GI tract during the procedure and reduce the bloating. If you had a lower endoscopy (such as a colonoscopy or flexible sigmoidoscopy) you may notice spotting of blood in your stool or on the toilet paper. If you underwent a bowel prep for your procedure, you may not have a normal bowel movement for a few days.  Please Note:  You might notice some irritation and congestion in your nose or some drainage.  This is from the oxygen used during your procedure.  There is no need for concern and it should clear up in a day or so.  SYMPTOMS TO REPORT IMMEDIATELY:  Following lower endoscopy (colonoscopy or flexible sigmoidoscopy):  Excessive amounts of blood in the stool  Significant tenderness or worsening of abdominal pains  Swelling of the abdomen that is new, acute  Fever of 100F or higher  Following upper endoscopy (EGD)  Vomiting of blood or coffee ground material  New chest pain or pain under the shoulder blades  Painful or persistently difficult swallowing  New shortness of breath  Fever of 100F or higher  Black, tarry-looking stools  For urgent or emergent issues, a gastroenterologist can be reached at any hour by calling [phone]. Do not use MyChart messaging for urgent concerns.    DIET:  We do recommend a small meal at first, but  then you may proceed to your regular diet.  Drink plenty of fluids but you should avoid alcoholic beverages for 24 hours.  ACTIVITY:  You should plan to take it easy for the rest of today and you should NOT DRIVE or use heavy machinery until tomorrow (because of the sedation medicines used during the test).    FOLLOW UP: Our staff will call the number listed on your records the next business day following your procedure.  We will call around 7:15- 8:00 am to check on you and address any questions or concerns that you may have regarding the information given to you following your procedure. If we do not reach you, we will leave a message.  If you develop any symptoms (ie: fever, flu-like symptoms, shortness of breath, cough etc.) before then, please call [phone].  If you test positive for Covid 19 in the 2 weeks post procedure, please call and report this information to us.    If any biopsies were taken you will be contacted by phone or by letter within the next 1-3 weeks.  Please call us at [phone] if you have not heard about the biopsies in 3 weeks.    SIGNATURES/CONFIDENTIALITY: You and/or your care partner have signed paperwork which will be entered into your electronic medical record.  These signatures attest to the fact that that the information above on your After Visit Summary has been reviewed and is understood.  Full responsibility of the confidentiality   of this discharge information lies with you and/or your care-partner.  

## 2022-07-05 NOTE — Progress Notes (Signed)
Vitals-AS  Pt's states no medical or surgical changes since previsit or office visit.  

## 2022-07-05 NOTE — Progress Notes (Signed)
Called to room to assist during endoscopic procedure.  Patient ID and intended procedure confirmed with present staff. Received instructions for my participation in the procedure from the performing physician.  

## 2022-07-05 NOTE — Op Note (Signed)
[facility] Patient Name: Rachel Mckinney Procedure Date: 07/05/2022 11:00 AM MRN: [id number] Endoscopist: Justice Britain , MD Age: 46 Referring MD:  Date of Birth: 07/08/1976 Gender: Female Account #: [id number] Procedure:                Colonoscopy Indications:              Screening for colorectal malignant neoplasm,                            Incidental - Rectal bleeding, Incidental - Change                            in bowel habits Medicines:                Monitored Anesthesia Care Procedure:                Pre-Anesthesia Assessment:                           - Prior to the procedure, a History and Physical                            was performed, and patient medications and                            allergies were reviewed. The patient's tolerance of                            previous anesthesia was also reviewed. The risks                            and benefits of the procedure and the sedation                            options and risks were discussed with the patient.                            All questions were answered, and informed consent                            was obtained. Prior Anticoagulants: The patient has                            taken no previous anticoagulant or antiplatelet                            agents except for NSAID medication. ASA Grade                            Assessment: II - A patient with mild systemic                            disease. After reviewing the risks and benefits,  the patient was deemed in satisfactory condition to                            undergo the procedure.                           After obtaining informed consent, the colonoscope                            was passed under direct vision. Throughout the                            procedure, the patient's blood pressure, pulse, and                            oxygen saturations were monitored continuously. The                             Olympus CF-HQ190L ([id number]) Colonoscope was                            introduced through the anus and advanced to the 5                            cm into the ileum. The colonoscopy was performed                            without difficulty. The patient tolerated the                            procedure. The quality of the bowel preparation was                            good. The terminal ileum, ileocecal valve,                            appendiceal orifice, and rectum were photographed. Scope In: 11:11:47 AM Scope Out: 11:30:00 AM Scope Withdrawal Time: 0 hours 15 minutes 35 seconds  Total Procedure Duration: 0 hours 18 minutes 13 seconds  Findings:                 The perianal exam findings included healing                            appearance of gluteal ulceration and what appear to                            be previous abscess tracts (no drainage - not                            clearly perianal fistula).                           The digital rectal exam findings include  hemorrhoids. Pertinent negatives include no                            palpable rectal lesions.                           The terminal ileum and ileocecal valve appeared                            normal.                           Five sessile polyps were found in the recto-sigmoid                            colon (3), sigmoid colon (1) and ascending colon                            (1). The polyps were 2 to 5 mm in size. These                            polyps were removed with a cold snare. Resection                            and retrieval were complete.                           A 16 mm polyp was found in the transverse colon.                            The polyp was pedunculated. The polyp was removed                            with a hot snare. Resection and retrieval were                            complete.                           Normal mucosa was found in the  recto-sigmoid colon,                            in the sigmoid colon, in the descending colon, at                            the splenic flexure, in the transverse colon, at                            the hepatic flexure, in the ascending colon and in                            the cecum.  Patchy areas of granular mucosa were found in the                            rectum. Biopsies were taken with a cold forceps for                            histology to rule out chronic proctitis.                           Non-bleeding non-thrombosed internal hemorrhoids                            were found during retroflexion and during perianal                            exam. The hemorrhoids were Grade II (internal                            hemorrhoids that prolapse but reduce spontaneously). Complications:            No immediate complications. Estimated Blood Loss:     Estimated blood loss was minimal. Impression:               - Gluteal healing ulceration and what appear to be                            prior abscess tracts (no drainage and not clearly                            perianal fistula) found on perianal exam.                           - Hemorrhoids found on digital rectal exam.                           - The examined portion of the ileum was normal.                           - Five 2 to 5 mm polyps at the recto-sigmoid colon,                            in the sigmoid colon and in the ascending colon,                            removed with a cold snare. Resected and retrieved.                           - One 16 mm polyp in the transverse colon, removed                            with a hot snare. Resected and retrieved.                           -  Normal mucosa in the recto-sigmoid colon, in the                            sigmoid colon, in the descending colon, at the                            splenic flexure, in the transverse colon, at the                             hepatic flexure, in the ascending colon and in the                            cecum.                           - Granularity in the rectum. Biopsied.                           - Non-bleeding non-thrombosed internal hemorrhoids. Recommendation:           - The patient will be observed post-procedure,                            until all discharge criteria are met.                           - Discharge patient to home.                           - Patient has a contact number available for                            emergencies. The signs and symptoms of potential                            delayed complications were discussed with the                            patient. Return to normal activities tomorrow.                            Written discharge instructions were provided to the                            patient.                           - High fiber diet.                           - Use FiberCon 1-2 tablets PO daily.                           - Continue present medications.                           -  Await pathology results.                           - Repeat colonoscopy in likely 3 years for                            surveillance based on pathology results (as long as                            no evidence of chronic proctitis is found on rectal                            biopsies).                           - The findings and recommendations were discussed                            with the patient.                           - The findings and recommendations were discussed                            with the designated responsible adult. Justice Britain, MD 07/05/2022 11:43:10 AM

## 2022-07-05 NOTE — Progress Notes (Signed)
Pt non-responsive, VVS, Report to RN  °

## 2022-07-06 ENCOUNTER — Telehealth: Payer: Self-pay

## 2022-07-06 NOTE — Telephone Encounter (Signed)
  Follow up Call-     07/05/2022   10:18 AM 07/05/2022   10:13 AM  Call back number  Post procedure Call Back phone  # [phone]   Permission to leave phone message  Yes     Patient questions:  Do you have a fever, pain , or abdominal swelling? No. Pain Score  0 *  Have you tolerated food without any problems? Yes.    Have you been able to return to your normal activities? Yes.    Do you have any questions about your discharge instructions: Diet   No. Medications  No. Follow up visit  No.  Do you have questions or concerns about your Care? No.  Actions: * If pain score is 4 or above: No action needed, pain <4.

## 2022-07-08 ENCOUNTER — Encounter: Payer: Self-pay | Admitting: Gastroenterology

## 2022-07-26 ENCOUNTER — Ambulatory Visit (INDEPENDENT_AMBULATORY_CARE_PROVIDER_SITE_OTHER): Payer: 59

## 2022-07-26 VITALS — BP 147/82 | HR 77 | Temp 98.2°F | Resp 18 | Ht 64.0 in | Wt 212.6 lb

## 2022-07-26 DIAGNOSIS — L732 Hidradenitis suppurativa: Secondary | ICD-10-CM

## 2022-07-26 DIAGNOSIS — M1380 Other specified arthritis, unspecified site: Secondary | ICD-10-CM

## 2022-07-26 DIAGNOSIS — Z79899 Other long term (current) drug therapy: Secondary | ICD-10-CM | POA: Diagnosis not present

## 2022-07-26 DIAGNOSIS — M059 Rheumatoid arthritis with rheumatoid factor, unspecified: Secondary | ICD-10-CM

## 2022-07-26 DIAGNOSIS — M1990 Unspecified osteoarthritis, unspecified site: Secondary | ICD-10-CM | POA: Diagnosis not present

## 2022-07-26 MED ORDER — SODIUM CHLORIDE 0.9 % IV SOLN
800.0000 mg | INTRAVENOUS | Status: DC
  Administered 2022-07-26: 800 mg via INTRAVENOUS
  Filled 2022-07-26: qty 80

## 2022-07-26 MED ORDER — DIPHENHYDRAMINE HCL 25 MG PO CAPS
25.0000 mg | ORAL_CAPSULE | Freq: Once | ORAL | Status: AC
  Administered 2022-07-26: 25 mg via ORAL
  Filled 2022-07-26: qty 1

## 2022-07-26 MED ORDER — ACETAMINOPHEN 325 MG PO TABS
650.0000 mg | ORAL_TABLET | Freq: Once | ORAL | Status: AC
  Administered 2022-07-26: 650 mg via ORAL
  Filled 2022-07-26: qty 2

## 2022-07-26 NOTE — Progress Notes (Signed)
Diagnosis: Crohn's Disease  Provider:  Marshell Garfinkel, MD  Procedure: Infusion  IV Type: Peripheral, IV Location: R Antecubital  Remicade (Infliximab), Dose: '800mg'$   Infusion Start Time: 1358  Infusion Stop Time: 1500 pm  Post Infusion IV Care: Peripheral IV Discontinued  Discharge: Condition: Good, Destination: Home . AVS provided to patient.   Performed by:  Adelina Mings, LPN

## 2022-08-19 ENCOUNTER — Telehealth: Payer: Self-pay | Admitting: Pharmacy Technician

## 2022-08-19 NOTE — Telephone Encounter (Addendum)
[employer] PAP - Free drug: Approved  Id: [id number] 08/19/22 - 08/20/23 Phone: [phone] Fax: [phone]  Note: Product Request Form must be submitted to receive medication. Please order medication 10 days prior to treatment. Form is attached in media tab

## 2022-09-07 ENCOUNTER — Telehealth: Payer: Self-pay | Admitting: Pharmacy Technician

## 2022-09-07 NOTE — Telephone Encounter (Signed)
Signed script, office should be sending this back over. Thanks.

## 2022-09-07 NOTE — Telephone Encounter (Signed)
Dr. Benjamine Mola, Thanks, script has been faxed to pharmacy

## 2022-09-07 NOTE — Telephone Encounter (Signed)
Dr. Benjamine Mola,  Patient has upcoming appt 09/22/22. Patient needs new script for Remicade. Please sign script and return to me. Script has been faxed to: [phone].

## 2022-09-22 ENCOUNTER — Ambulatory Visit: Payer: 59

## 2022-10-06 ENCOUNTER — Ambulatory Visit (INDEPENDENT_AMBULATORY_CARE_PROVIDER_SITE_OTHER): Payer: Self-pay

## 2022-10-06 ENCOUNTER — Other Ambulatory Visit (INDEPENDENT_AMBULATORY_CARE_PROVIDER_SITE_OTHER): Payer: Self-pay

## 2022-10-06 VITALS — BP 166/96 | HR 67 | Temp 98.4°F | Resp 18 | Ht 65.0 in | Wt 221.6 lb

## 2022-10-06 DIAGNOSIS — L732 Hidradenitis suppurativa: Secondary | ICD-10-CM

## 2022-10-06 DIAGNOSIS — M1990 Unspecified osteoarthritis, unspecified site: Secondary | ICD-10-CM

## 2022-10-06 DIAGNOSIS — M059 Rheumatoid arthritis with rheumatoid factor, unspecified: Secondary | ICD-10-CM

## 2022-10-06 DIAGNOSIS — Z79899 Other long term (current) drug therapy: Secondary | ICD-10-CM

## 2022-10-06 LAB — CBC WITH DIFFERENTIAL/PLATELET
Basophils Absolute: 0 10*3/uL (ref 0.0–0.1)
Basophils Relative: 0.6 % (ref 0.0–3.0)
Eosinophils Absolute: 0 10*3/uL (ref 0.0–0.7)
Eosinophils Relative: 0.5 % (ref 0.0–5.0)
HCT: 33.2 % — ABNORMAL LOW (ref 36.0–46.0)
Hemoglobin: 11 g/dL — ABNORMAL LOW (ref 12.0–15.0)
Lymphocytes Relative: 28.5 % (ref 12.0–46.0)
Lymphs Abs: 2 10*3/uL (ref 0.7–4.0)
MCHC: 33.2 g/dL (ref 30.0–36.0)
MCV: 98.7 fl (ref 78.0–100.0)
Monocytes Absolute: 0.6 10*3/uL (ref 0.1–1.0)
Monocytes Relative: 8.8 % (ref 3.0–12.0)
Neutro Abs: 4.3 10*3/uL (ref 1.4–7.7)
Neutrophils Relative %: 61.6 % (ref 43.0–77.0)
Platelets: 199 10*3/uL (ref 150.0–400.0)
RBC: 3.36 Mil/uL — ABNORMAL LOW (ref 3.87–5.11)
RDW: 13.8 % (ref 11.5–15.5)
WBC: 6.9 10*3/uL (ref 4.0–10.5)

## 2022-10-06 LAB — COMPREHENSIVE METABOLIC PANEL
ALT: 16 U/L (ref 0–35)
AST: 21 U/L (ref 0–37)
Albumin: 4.1 g/dL (ref 3.5–5.2)
Alkaline Phosphatase: 65 U/L (ref 39–117)
BUN: 20 mg/dL (ref 6–23)
CO2: 27 mEq/L (ref 19–32)
Calcium: 8.9 mg/dL (ref 8.4–10.5)
Chloride: 103 mEq/L (ref 96–112)
Creatinine, Ser: 0.78 mg/dL (ref 0.40–1.20)
GFR: 90.38 mL/min (ref >60.00)
Glucose, Bld: 80 mg/dL (ref 70–99)
Potassium: 4 mEq/L (ref 3.5–5.1)
Sodium: 135 mEq/L (ref 135–145)
Total Bilirubin: 0.3 mg/dL (ref 0.2–1.2)
Total Protein: 7.2 g/dL (ref 6.0–8.3)

## 2022-10-06 MED ORDER — SODIUM CHLORIDE 0.9 % IV SOLN
800.0000 mg | INTRAVENOUS | Status: DC
  Administered 2022-10-06: 800 mg via INTRAVENOUS
  Filled 2022-10-06: qty 80

## 2022-10-06 MED ORDER — DIPHENHYDRAMINE HCL 25 MG PO CAPS
25.0000 mg | ORAL_CAPSULE | Freq: Once | ORAL | Status: AC
  Administered 2022-10-06: 25 mg via ORAL
  Filled 2022-10-06: qty 1

## 2022-10-06 MED ORDER — ACETAMINOPHEN 325 MG PO TABS
650.0000 mg | ORAL_TABLET | Freq: Once | ORAL | Status: AC
  Administered 2022-10-06: 650 mg via ORAL
  Filled 2022-10-06: qty 2

## 2022-10-06 NOTE — Progress Notes (Signed)
Diagnosis: Rheumatoid Arthritis  Provider:  Marshell Garfinkel MD  Procedure: Infusion  IV Type: Peripheral, IV Location: L Antecubital  Remicade (Infliximab), Dose: '800mg'$   Infusion Start Time: 1350  Infusion Stop Time: 1608  Post Infusion IV Care: Peripheral IV Discontinued  Discharge: Condition: Good, Destination: Home . AVS provided to patient.   Performed by:  Koren Shiver, RN

## 2022-10-06 NOTE — Progress Notes (Deleted)
Diagnosis: Crohn's Disease  Provider:  Marshell Garfinkel MD  Procedure: Infusion  IV Type: Peripheral, IV Location: L Antecubital  Remicade (Infliximab), Dose: '800mg'$   {Infusion Start Time:25399}  {Infusion Stop Time:25400}  Post Infusion IV Care: {CHINF Post Infusion:25398}  Discharge: {Condition:19696:::1}, {Destination:18313::"Home":1} . AVS provided to patient.   Performed by:  Adelina Mings, LPN

## 2022-10-12 ENCOUNTER — Other Ambulatory Visit: Payer: Self-pay | Admitting: Obstetrics and Gynecology

## 2022-10-12 DIAGNOSIS — N83202 Unspecified ovarian cyst, left side: Secondary | ICD-10-CM

## 2022-10-13 ENCOUNTER — Encounter: Payer: Self-pay | Admitting: Internal Medicine

## 2022-11-04 ENCOUNTER — Telehealth: Payer: Self-pay | Admitting: Pharmacy Technician

## 2022-11-04 NOTE — Telephone Encounter (Signed)
Dr. Benjamine Mola,  Please sign script for replacement vials of Remicade for patient assistance program. Forms have been faxed to: [phone].  Thanks Maudie Mercury

## 2022-11-07 NOTE — Telephone Encounter (Signed)
I don't believe I have this form to sign, do we have it somewhere?

## 2022-11-07 NOTE — Telephone Encounter (Signed)
Dr. Benjamine Mola, I have refaxed the forms for the replacement vials (patient assistance program)

## 2022-11-18 ENCOUNTER — Encounter: Payer: Self-pay | Admitting: Internal Medicine

## 2022-11-18 NOTE — Telephone Encounter (Signed)
I have still not seen this form anywhere and I have rechecked all of my folders. Do we have this in the office somewhere?

## 2022-11-18 NOTE — Telephone Encounter (Signed)
Rachel Mckinney called the office again to states she faxed paperwork to be signed and faxed back to her for the patient to get her medication free through the patient assistance program. Please advise.

## 2022-11-21 ENCOUNTER — Encounter: Payer: Self-pay | Admitting: Student

## 2022-11-21 ENCOUNTER — Encounter: Payer: Self-pay | Admitting: Internal Medicine

## 2022-11-21 ENCOUNTER — Telehealth: Payer: Self-pay | Admitting: Pharmacy Technician

## 2022-11-21 ENCOUNTER — Ambulatory Visit (INDEPENDENT_AMBULATORY_CARE_PROVIDER_SITE_OTHER): Payer: Medicaid Other | Admitting: Student

## 2022-11-21 VITALS — BP 120/82 | HR 97 | Ht 65.0 in | Wt 223.0 lb

## 2022-11-21 DIAGNOSIS — N764 Abscess of vulva: Secondary | ICD-10-CM | POA: Diagnosis present

## 2022-11-21 DIAGNOSIS — Z23 Encounter for immunization: Secondary | ICD-10-CM

## 2022-11-21 DIAGNOSIS — M65932 Unspecified synovitis and tenosynovitis, left forearm: Secondary | ICD-10-CM

## 2022-11-21 DIAGNOSIS — M65832 Other synovitis and tenosynovitis, left forearm: Secondary | ICD-10-CM

## 2022-11-21 MED ORDER — DOXYCYCLINE HYCLATE 100 MG PO TABS: 100.0000 mg | ORAL_TABLET | Freq: Two times a day (BID) | ORAL | 0 refills | Status: AC

## 2022-11-21 MED ORDER — NAPROXEN 500 MG PO TABS: 500.0000 mg | ORAL_TABLET | Freq: Two times a day (BID) | ORAL | 0 refills | Status: DC | PRN

## 2022-11-21 MED ORDER — NAPROXEN 500 MG PO TABS: 500.0000 mg | ORAL_TABLET | Freq: Two times a day (BID) | ORAL | 0 refills | Status: DC

## 2022-11-21 MED ORDER — FLUCONAZOLE 150 MG PO TABS: 150.0000 mg | ORAL_TABLET | Freq: Once | ORAL | 0 refills | Status: AC

## 2022-11-21 MED ORDER — PHENTERMINE HCL 15 MG PO CAPS: ORAL_CAPSULE | ORAL | 0 refills | Status: DC

## 2022-11-21 MED ORDER — OXYCODONE-ACETAMINOPHEN 5-325 MG PO TABS: 1.0000 | ORAL_TABLET | Freq: Three times a day (TID) | ORAL | 0 refills | Status: DC | PRN

## 2022-11-21 NOTE — Progress Notes (Signed)
    SUBJECTIVE:   CHIEF COMPLAINT / HPI:   Rachel Mckinney is a 46 year-old female with history of hidradenitis suppurativa here with abscess on labia.  She noticed that a labial boil started last Thursday.   Sometimes has required incision and drainage in the past, which she says almost immediately relieves her pain. She is in significant pain and has been wearing loose clothing at home. Denies any fevers or chills.  Denies any signs of infection elsewhere on her skin.  She is on Remicade, due for her next treatment on Monday.  She says that the closer she gets to timing for her injection, the high risk she is to get these abscesses.    HTN Taking HCTZ 25 mg tablet daily.  Due for flu vaccination- would like to get it today.  PERTINENT  PMH / PSH: Reviewed  OBJECTIVE:   BP 120/82   Pulse 97   Ht '5\' 5"'$  (1.651 m)   Wt 223 lb (101.2 kg)   SpO2 99%   BMI 37.11 kg/m   General: Well-appearing, no distress Respiratory: Normal work of breathing on room air GU: [name], CMA present.  6 cm x 3 cm fluctuant abscess on right labia majora.  Surrounding erythema present.  No purulence or drainage present.   ASSESSMENT/PLAN:   Labial abscess Large fluctuant 6 cm x 3 cm right labial abscess in setting of hidradenitis suppurativa. -Prescribe doxycycline twice daily x 10 days.  Encouraged hot compresses and sitz bath's. -Also prescribed Percocet 5-325#10 tablets to be taken as needed for pain. -Also prescribed naproxen to be taken twice daily. -Called gynecologist office today and got her scheduled at [facility] for urgent appointment this Thursday 12/7 for evaluation and likely I&D.  Provided patient with appointment card and phone number for this office. -Discussed return precautions including fever, chills.     Orvis Brill, [place]

## 2022-11-21 NOTE — Assessment & Plan Note (Addendum)
Large fluctuant 6 cm x 3 cm right labial abscess in setting of hidradenitis suppurativa. -Prescribe doxycycline twice daily x 10 days.  Encouraged hot compresses and sitz bath's. -Also prescribed Percocet 5-325#10 tablets to be taken as needed for pain. -Also prescribed naproxen to be taken twice daily. -Called gynecologist office today and got her scheduled at [facility] for urgent appointment this Thursday 12/7 for evaluation and likely I&D.  Provided patient with appointment card and phone number for this office. -Discussed return precautions including fever, chills.

## 2022-11-21 NOTE — Telephone Encounter (Signed)
Yes, I have received the forms. Thanks for your assistance. Maudie Mercury

## 2022-11-21 NOTE — Patient Instructions (Addendum)
Rachel Mckinney, for your abscess we will treat you with antibiotics and pain medicine until you are able to be seen by the gynecologist this week.  12/7 at 9:00 am at [facility] Address: [phone]  Please rest and take the antibiotic Doxycycline twice daily. Take Naproxen twice daily.  Use hot compresses and sitz baths.  If you develop fever or chills, please go to urgent care or the ER.  Have a wonderful day, Dr. Owens Shark

## 2022-11-21 NOTE — Telephone Encounter (Signed)
Seth Bake, I have faxed over another PAP form for Dr. Benjamine Mola to sign for patient to receive her free medication. Her next scheduled appt is 12/01/22.  Please have Dr. Benjamine Mola sign forms as soon as possible to allow time for delivery of medication. Forms have been faxed to: [phone]  Thanks for your assistance Maudie Mercury

## 2022-11-22 ENCOUNTER — Other Ambulatory Visit: Payer: Self-pay | Admitting: Family Medicine

## 2022-11-22 ENCOUNTER — Telehealth: Payer: Self-pay

## 2022-11-22 DIAGNOSIS — N764 Abscess of vulva: Secondary | ICD-10-CM

## 2022-11-22 MED ORDER — PHENTERMINE HCL 15 MG PO CAPS: ORAL_CAPSULE | ORAL | 0 refills | Status: DC

## 2022-11-22 MED ORDER — OXYCODONE-ACETAMINOPHEN 5-325 MG PO TABS: 1.0000 | ORAL_TABLET | Freq: Three times a day (TID) | ORAL | 0 refills | Status: DC | PRN

## 2022-11-22 NOTE — Telephone Encounter (Signed)
Patient calls nurse line reporting difficulty picking up Oxycodone and Phentermine.   I called the pharmacy and her insurance is not accepting a resident DEA.   Will forward to morning preceptor to send in.

## 2022-11-29 NOTE — Telephone Encounter (Signed)
We did get this earlier sorry I did not document that at the time I think [facility] sent it. The signed form was scanned in 12/4 on media tab.

## 2022-12-01 ENCOUNTER — Ambulatory Visit (INDEPENDENT_AMBULATORY_CARE_PROVIDER_SITE_OTHER): Payer: Medicaid Other

## 2022-12-01 VITALS — BP 136/85 | HR 70 | Temp 98.4°F | Resp 18 | Ht 65.0 in | Wt 229.0 lb

## 2022-12-01 DIAGNOSIS — M1990 Unspecified osteoarthritis, unspecified site: Secondary | ICD-10-CM

## 2022-12-01 DIAGNOSIS — M059 Rheumatoid arthritis with rheumatoid factor, unspecified: Secondary | ICD-10-CM

## 2022-12-01 DIAGNOSIS — L732 Hidradenitis suppurativa: Secondary | ICD-10-CM | POA: Diagnosis not present

## 2022-12-01 DIAGNOSIS — Z79899 Other long term (current) drug therapy: Secondary | ICD-10-CM

## 2022-12-01 MED ORDER — DIPHENHYDRAMINE HCL 25 MG PO CAPS
25.0000 mg | ORAL_CAPSULE | Freq: Once | ORAL | Status: AC
  Administered 2022-12-01: 25 mg via ORAL
  Filled 2022-12-01: qty 1

## 2022-12-01 MED ORDER — ACETAMINOPHEN 325 MG PO TABS
650.0000 mg | ORAL_TABLET | Freq: Once | ORAL | Status: AC
  Administered 2022-12-01: 650 mg via ORAL
  Filled 2022-12-01: qty 2

## 2022-12-01 MED ORDER — SODIUM CHLORIDE 0.9 % IV SOLN
800.0000 mg | INTRAVENOUS | Status: DC
  Administered 2022-12-01: 800 mg via INTRAVENOUS
  Filled 2022-12-01: qty 80

## 2022-12-01 NOTE — Progress Notes (Signed)
Diagnosis: Rheumatoid Arthritis  Provider:  Marshell Garfinkel MD  Procedure: Infusion  IV Type: Peripheral, IV Location: L Antecubital  Remicade (Infliximab), Dose: '800mg'$   Infusion Start Time: 1406  Infusion Stop Time: 1625  Post Infusion IV Care: Peripheral IV Discontinued  Discharge: Condition: Good, Destination: Home . AVS provided to patient.   Performed by:  Koren Shiver, RN

## 2022-12-06 ENCOUNTER — Telehealth: Payer: Self-pay | Admitting: Pharmacist

## 2022-12-06 NOTE — Telephone Encounter (Signed)
Received fax for order renewal for patient's Remicade for [employer] patient assistance. Requires prescriber signature. Completed form and sent back to [facility] team for coordination  Remicade dose: '800mg'$  IV every 8 weeks  Patient is also overdue for f/u appt. Was due in September 2023. Will route to scheduling team for assistance with this  Knox Saliva, PharmD, MPH, BCPS, CPP Clinical Pharmacist (Rheumatology and Pulmonology)

## 2022-12-22 ENCOUNTER — Ambulatory Visit: Payer: Medicaid Other | Admitting: Obstetrics and Gynecology

## 2022-12-29 ENCOUNTER — Encounter: Payer: Self-pay | Admitting: Student

## 2022-12-29 ENCOUNTER — Ambulatory Visit (INDEPENDENT_AMBULATORY_CARE_PROVIDER_SITE_OTHER): Payer: Medicaid Other | Admitting: Student

## 2022-12-29 VITALS — BP 180/84 | HR 76 | Ht 65.0 in | Wt 228.0 lb

## 2022-12-29 DIAGNOSIS — I10 Essential (primary) hypertension: Secondary | ICD-10-CM

## 2022-12-29 DIAGNOSIS — J309 Allergic rhinitis, unspecified: Secondary | ICD-10-CM

## 2022-12-29 MED ORDER — FLUTICASONE PROPIONATE 50 MCG/ACT NA SUSP: 2.0000 | Freq: Every day | NASAL | 0 refills | Status: DC

## 2022-12-29 NOTE — Patient Instructions (Signed)
It was great to see you! Thank you for allowing me to participate in your care!   I recommend that you always bring your medications to each appointment as this makes it easy to ensure we are on the correct medications and helps Rachel Mckinney not miss when refills are needed.  Our plans for today:  - Use 2 sprays of Flonase in each nostril two times a day, for 7 days. - If you do not have improvement, please call Rachel Mckinney for an appointment - Please make an appointment to discuss blood pressure management at your earliest convenience  Take care and seek immediate care sooner if you develop any concerns. Please remember to show up 15 minutes before your scheduled appointment time!  Leslie Dales, DO [facility]

## 2022-12-29 NOTE — Assessment & Plan Note (Addendum)
HPI and physical exam consistent with allergic rhinitis versus viral URI.  Low suspicion of AOM (ear exam above).  Low suspicion of vertigo/other inner ear pathology, as patient's dizziness and tinnitus has resolved. - Trial Flonase 2 sprays per nostril twice daily - Continue OTC ibuprofen/acetaminophen for discomfort - Follow-up in 7 days if symptoms are not improved or worsened

## 2022-12-29 NOTE — Progress Notes (Signed)
    SUBJECTIVE:   CHIEF COMPLAINT / HPI:   Ear Discomfort Ringing in left ear and ear congestion started 2 days ago.  Felt off balance, but has not fallen and is not dizzy.  Experienced 2 days of tinnitus, however tinnitus and balance are both improved today.  She feels like the pressures also moving to her right ear.  She has tried Tylenol Sinus and severe headache without relief.  No past history of ear infections, has never had this happen before.  No known sick contacts.  Reports seasonal allergies-however no clear exposure to explain allergic symptoms at this time.  Smokes cigarettes, but has stopped smoking last couple days due to symptoms.  Blood pressure is elevated, see below.  HTN Blood pressure 178/90 in office this morning.  Patient reports having not taken her medications this morning, and not compliant past few days.  Intermittently checks blood pressure at home, says her blood pressure is better when she takes her medications as prescribed.  Patient is prescribed phentermine for weight loss, however she reports not take this medication yet-and we counseled not to start medication while blood pressure is elevated.  At this time do not see signs of hypertensive urgency/emergency.  Headache is intermittent and mild, no neurologic symptoms, ringing in ears is improved.  Highly recommend patient to be seen for blood pressure management at her earliest convenience.  PERTINENT  PMH / PSH: HTN, Tobacco use  OBJECTIVE:   BP (!) 180/84   Pulse 76   Ht '5\' 5"'$  (1.651 m)   Wt 228 lb (103.4 kg)   SpO2 100%   BMI 37.94 kg/m    General: NAD, pleasant HEENT: EOM intact bilaterally, normal conjunctiva, no drainage from eyes.  Nasal turbinates red and swollen bilaterally.  Postnasal drip present.  Fluid behind left TM (not bulging, normal cone of light, translucent membrane), right ear normal. Cardio: RRR, no MRG Respiratory: CTAB, normal breathing on room air Neuro: CN II: PERRL CN III,  IV,VI: EOMI CVII: Symmetric smile and brow raise CN VIII: Normal hearing CN IX,X: Symmetric palate raise  CN XII: Symmetric tongue protrusion  No overt focal neurological deficit appreciated on exam.   ASSESSMENT/PLAN:   Allergic rhinitis HPI and physical exam consistent with allergic rhinitis versus viral URI.  Low suspicion of AOM (ear exam above).  Low suspicion of vertigo/other inner ear pathology, as patient's dizziness and tinnitus has resolved. - Trial Flonase 2 sprays per nostril twice daily - Continue OTC ibuprofen/acetaminophen for discomfort - Follow-up in 7 days if symptoms are not improved or worsened  HTN (hypertension) Blood pressure elevated today 178/90, recheck 180/84.  Prescribed phentermine, but not currently taking.  Has not been taking medications as prescribed past few days.  Without red flags for hypertensive urgency.  Counseled patient on medication compliance and recommended follow-up at her earliest convenience to discuss blood pressure. - Follow-up in 2 to 4 weeks for blood pressure   Leslie Dales, [place]

## 2022-12-29 NOTE — Assessment & Plan Note (Addendum)
Blood pressure elevated today 178/90, recheck 180/84.  Prescribed phentermine, but not currently taking.  Has not been taking medications as prescribed past few days.  Without red flags for hypertensive urgency.  Counseled patient on medication compliance and recommended follow-up at her earliest convenience to discuss blood pressure. - Follow-up in 2 to 4 weeks for blood pressure

## 2023-01-09 NOTE — Progress Notes (Deleted)
Office Visit Note  Patient: Rachel Mckinney             Date of Birth: 06-10-1976           MRN: [id number]             PCP: Eppie Gibson, MD Referring: Eppie Gibson, MD Visit Date: 01/10/2023   Subjective:  No chief complaint on file.   History of Present Illness: Rachel Mckinney is a 47 y.o. female here for follow up ***   Previous HPI 06/07/22 Rachel Mckinney is a 47 y.o. female here for follow up for RA and HS on remicade 800 mg IV q8wks. She continues to have joint pains worst on her left hand and wrist. 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 She has tingling sensation affecting the right great toe intermittently now too. She has not seen as much swelling in the foot compared to before. Skin disease is well controlled. No new infections or major medical events.    Previous HPI 03/01/22 Rachel Mckinney is a 47 y.o. female here to establish care for rheumatoid arthritis and hidradenitis suppurativa on remicade 800 mg IV q8wks. She also has a history of HTN, anemia, depression, and lymphadenopathy. She is transferring care from [facility] with Dr. Graylin Shiver due to change in insurance status. She has a longstanding history of HS previously treated with antibiotics and surgeries and was on Humira prior to remicade, with incomplete symptom improvement. She started this 2021 with interruption then Remicade was restarted since 03/2021 with large improvement in left wrist inflammation. She had her last infusion late November, was scheduled late January but missed this due to needing to transfer care. She feels increasing symptoms since being overdue for treatment joint pain in left elbow, wrist, and fingers also painful swelling of her right great toe. She feels extremely stiff in the base of her neck and upper back. Skin rashes have not flared up too much yet.     Imaging reviewed MRI of the left wrist 11/09/2020 "IMPRESSION:  1. Severe synovitis throughout  the radiocarpal joint, midcarpal row, carpometacarpal articulation, and at the articulation between the triquetrum and pisiform bone. Erosions in ill-defined edema along portions of the carpus and metacarpal bases associated with the striking synovitis. Overall findings are highly suspicious for an inflammatory or crystalline arthropathy such as rheumatoid arthritis. Infectious arthropathy is considered less likely given the multi joint involvement, although is not totally excluded.  2. Tenosynovitis and peritendinitis involving the extensor compartments and flexor tendons.  3. There is some local expansion and edema in the ulnar nerve at the level of the radiocarpal joint, significance uncertain. No obvious impinging lesion is observed in this vicinity.  4. Circumferential subcutaneous edema in the wrist, possibly with some low-grade enhancement-cellulitis not excluded. "      No Rheumatology ROS completed.   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 Infiltration in the fat of the low pelvis anteriorly, involving  the vulva and right groin. Mild skin thickening. These changes are likely to represent cellulitis. No definite abscess collection identified. 2. Posteriorly along the gluteal crease, there is skin thickening and adjacent fatty induration possibly representing cellulitis or fistula. No loculated abscess cavity identified.   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 Added automatically from request for surgery [id number] Formatting of this note might be different from the original. Added automatically from request for surgery [id number]   Left medial elbow lymphadenopathy 11/10/2020   See Venous doppler left arm 11/09/20   Left wrist pain 11/10/2020   Polyarthralgia    Possible Peroneal tendonitis of left lower leg 2018   Dx by Podiatry   Presence of (intrauterine) contraceptive device 06/29/2020   Rheumatoid arthritis ([place])     Family History  Problem Relation Age of Onset   Liver disease Father    Hypertension Father    Hypertension Sister    Fibroids Sister    Hypertension Sister    Fibroids Sister    Hypertension Sister    Fibroids Sister    Fibroids Sister    Diabetes Paternal Grandfather    Hypertension Paternal Grandfather    Kidney disease Paternal Uncle    Past Surgical History:  Procedure Laterality Date   AXILLARY HIDRADENITIS EXCISION     CESAREAN SECTION     multiple abscess drainages     VULVECTOMY PARTIAL Left 02/21/2020   [facility]; Dorien Chihuahua, MD   Social History   Social History Narrative   Not on file    Immunization History  Administered Date(s) Administered   Influenza Split 09/03/2012   Influenza Whole 10/11/2007   Influenza,inj,Quad PF,6+ Mos 10/20/2021, 11/21/2022   Td 09/19/2003     Objective: Vital Signs: There were no vitals taken for this visit.   Physical Exam   Musculoskeletal Exam: ***  CDAI Exam: CDAI Score: -- Patient Global: --; Provider Global: -- Swollen: --; Tender: -- Joint Exam 01/10/2023   No joint exam has been documented for this visit   There is currently no information documented on the homunculus. Go to the Rheumatology activity and complete the homunculus joint exam.  Investigation: No additional findings.  Imaging: No results found.  Recent Labs: Lab Results  Component Value Date   WBC 6.9 10/06/2022   HGB 11.0 (L) 10/06/2022   PLT 199.0 10/06/2022   NA 135 10/06/2022   K 4.0 10/06/2022   CL 103 10/06/2022   CO2 27 10/06/2022   GLUCOSE 80 10/06/2022   BUN 20 10/06/2022   CREATININE 0.78 10/06/2022   BILITOT 0.3 10/06/2022   ALKPHOS 65 10/06/2022   AST 21 10/06/2022  ALT 16 10/06/2022   PROT 7.2 10/06/2022   ALBUMIN 4.1 10/06/2022   CALCIUM 8.9 10/06/2022   GFRAA 106 11/16/2020   QFTBGOLDPLUS NEGATIVE 03/01/2022    Speciality Comments: No specialty comments available.  Procedures:  No procedures performed Allergies: No known allergies   Assessment / Plan:     Visit Diagnoses: No diagnosis found.  ***  Orders: No orders of the defined types were placed in this encounter.  No orders of the defined types were placed in this encounter.    Follow-Up Instructions: No follow-ups on file.   Collier Salina, MD  Note - This record has been created using [employer].  Chart creation errors have been sought, but may not always  have been located. Such creation errors do not reflect on  the standard of medical care.

## 2023-01-10 ENCOUNTER — Ambulatory Visit: Payer: Medicaid Other | Attending: Internal Medicine | Admitting: Internal Medicine

## 2023-01-16 ENCOUNTER — Telehealth: Payer: Self-pay | Admitting: Pharmacy Technician

## 2023-01-16 NOTE — Telephone Encounter (Signed)
Dr. Benjamine Mola,  Please sign patient assistance re-order forms for Remicade. Patient has upcoming appt 01/26/23. Forms has been faxed to: [phone]

## 2023-01-26 ENCOUNTER — Ambulatory Visit (INDEPENDENT_AMBULATORY_CARE_PROVIDER_SITE_OTHER): Payer: Medicaid Other

## 2023-01-26 ENCOUNTER — Other Ambulatory Visit (INDEPENDENT_AMBULATORY_CARE_PROVIDER_SITE_OTHER): Payer: Medicaid Other

## 2023-01-26 VITALS — BP 172/93 | HR 68 | Temp 97.7°F | Resp 16 | Ht 65.0 in | Wt 230.4 lb

## 2023-01-26 DIAGNOSIS — Z79899 Other long term (current) drug therapy: Secondary | ICD-10-CM

## 2023-01-26 DIAGNOSIS — M1990 Unspecified osteoarthritis, unspecified site: Secondary | ICD-10-CM | POA: Diagnosis not present

## 2023-01-26 DIAGNOSIS — M059 Rheumatoid arthritis with rheumatoid factor, unspecified: Secondary | ICD-10-CM

## 2023-01-26 DIAGNOSIS — L732 Hidradenitis suppurativa: Secondary | ICD-10-CM

## 2023-01-26 LAB — COMPREHENSIVE METABOLIC PANEL
ALT: 20 U/L (ref 0–35)
AST: 21 U/L (ref 0–37)
Albumin: 3.9 g/dL (ref 3.5–5.2)
Alkaline Phosphatase: 77 U/L (ref 39–117)
BUN: 14 mg/dL (ref 6–23)
CO2: 28 mEq/L (ref 19–32)
Calcium: 9 mg/dL (ref 8.4–10.5)
Chloride: 102 mEq/L (ref 96–112)
Creatinine, Ser: 0.76 mg/dL (ref 0.40–1.20)
GFR: 93.05 mL/min (ref >60.00)
Glucose, Bld: 88 mg/dL (ref 70–99)
Potassium: 3.6 mEq/L (ref 3.5–5.1)
Sodium: 137 mEq/L (ref 135–145)
Total Bilirubin: 0.3 mg/dL (ref 0.2–1.2)
Total Protein: 7.3 g/dL (ref 6.0–8.3)

## 2023-01-26 LAB — CBC WITH DIFFERENTIAL/PLATELET
Basophils Absolute: 0.1 10*3/uL (ref 0.0–0.1)
Basophils Relative: 1.5 % (ref 0.0–3.0)
Eosinophils Absolute: 0.1 10*3/uL (ref 0.0–0.7)
Eosinophils Relative: 1.9 % (ref 0.0–5.0)
HCT: 33.7 % — ABNORMAL LOW (ref 36.0–46.0)
Hemoglobin: 11.4 g/dL — ABNORMAL LOW (ref 12.0–15.0)
Lymphocytes Relative: 29.6 % (ref 12.0–46.0)
Lymphs Abs: 1.7 10*3/uL (ref 0.7–4.0)
MCHC: 34 g/dL (ref 30.0–36.0)
MCV: 97.1 fl (ref 78.0–100.0)
Monocytes Absolute: 0.8 10*3/uL (ref 0.1–1.0)
Monocytes Relative: 13.6 % — ABNORMAL HIGH (ref 3.0–12.0)
Neutro Abs: 3.1 10*3/uL (ref 1.4–7.7)
Neutrophils Relative %: 53.4 % (ref 43.0–77.0)
Platelets: 263 10*3/uL (ref 150.0–400.0)
RBC: 3.47 Mil/uL — ABNORMAL LOW (ref 3.87–5.11)
RDW: 13 % (ref 11.5–15.5)
WBC: 5.8 10*3/uL (ref 4.0–10.5)

## 2023-01-26 MED ORDER — SODIUM CHLORIDE 0.9 % IV SOLN
800.0000 mg | INTRAVENOUS | Status: DC
  Administered 2023-01-26: 800 mg via INTRAVENOUS
  Filled 2023-01-26: qty 80

## 2023-01-26 MED ORDER — ACETAMINOPHEN 325 MG PO TABS
650.0000 mg | ORAL_TABLET | Freq: Once | ORAL | Status: AC
  Administered 2023-01-26: 650 mg via ORAL
  Filled 2023-01-26: qty 2

## 2023-01-26 MED ORDER — DIPHENHYDRAMINE HCL 25 MG PO CAPS
25.0000 mg | ORAL_CAPSULE | Freq: Once | ORAL | Status: AC
  Administered 2023-01-26: 25 mg via ORAL
  Filled 2023-01-26: qty 1

## 2023-01-26 NOTE — Progress Notes (Signed)
Diagnosis: Rheumatoid Arthritis  Provider:  Marshell Garfinkel MD  Procedure: Infusion  IV Type: Peripheral, IV Location: R Antecubital  Remicade (Infliximab), Dose: '800mg'$   Infusion Start Time: 1113  Infusion Stop Time: 1334  Post Infusion IV Care: Peripheral IV Discontinued  Discharge: Condition: Good, Destination: Home . AVS Provided  Performed by:  Cleophus Molt, RN

## 2023-02-08 ENCOUNTER — Ambulatory Visit (HOSPITAL_COMMUNITY)
Admission: RE | Admit: 2023-02-08 | Discharge: 2023-02-08 | Disposition: A | Discharge status: Home / Self Care | Payer: Medicaid Other | Source: Ambulatory Visit | Attending: Internal Medicine | Admitting: Internal Medicine

## 2023-02-08 ENCOUNTER — Encounter (HOSPITAL_COMMUNITY): Payer: Self-pay

## 2023-02-08 VITALS — BP 158/79 | HR 70 | Temp 98.4°F | Resp 16 | Ht 65.0 in | Wt 223.0 lb

## 2023-02-08 DIAGNOSIS — M25561 Pain in right knee: Secondary | ICD-10-CM

## 2023-02-08 MED ORDER — KETOROLAC TROMETHAMINE 30 MG/ML IJ SOLN
INTRAMUSCULAR | Status: AC
  Filled 2023-02-08: qty 1

## 2023-02-08 MED ORDER — KETOROLAC TROMETHAMINE 30 MG/ML IJ SOLN
30.0000 mg | Freq: Once | INTRAMUSCULAR | Status: AC
  Administered 2023-02-08: 30 mg via INTRAMUSCULAR

## 2023-02-08 MED ORDER — METHYLPREDNISOLONE 4 MG PO TBPK: ORAL_TABLET | ORAL | 0 refills | Status: DC

## 2023-02-08 NOTE — ED Triage Notes (Signed)
Chief Complaint: knee pain in the right. No known falls or injuries. Heard a pop while she was in bed when stretching the knee out. No history of knee issues. States pain is worse with getting up and down.   Onset: 1 week   Prescriptions or OTC medications tried: Yes- extra strength tylenol     with no relief

## 2023-02-08 NOTE — ED Provider Notes (Addendum)
[place]    CSN: [id number] Arrival date & time: 02/08/23  1702      History   Chief Complaint Chief Complaint  Patient presents with   Knee Pain    Entered by patient   Appointment    HPI Rachel Mckinney is a 47 y.o. female.   Patient presents urgent care for evaluation of right-sided knee pain that started 1 week ago after she was stretching her right knee in the bed and she heard a pop to the posterior aspect of the right knee.  Denies previous history of injury to the right knee, numbness and tingling distal to injury/pain, and redness/warmth to the right knee.  Pain does not radiate and is local to the posterior right knee.  No recent long car rides or long periods of sitting without movement.  Patient works standing up on her feet frequently during the day and believes that this is contributing to her knee discomfort.  She also has a history of rheumatoid arthritis.  She has not had any recent steroids and states that her rheumatoid arthritis has not flared up in approximately 1 year as she receives medication infusions for this.  She has not heard any more popping or clicking to the right knee since the initial injury while she was laying flat.  Ambulatory with slight limp favoring the right leg.  She has been using naproxen at home as well as Tylenol and has not found relief with either of these medications.  She has also been performing cool compresses without relief.   Knee Pain   Past Medical History:  Diagnosis Date   Abnormal finding on CT scan of Pelvis 11/10/2020   1. Infiltration in the fat of the low pelvis anteriorly, involving the vulva and right groin. Mild skin thickening. These changes are likely to represent cellulitis. No definite abscess collection identified. 2. Posteriorly along the gluteal crease, there is skin thickening and adjacent fatty induration possibly representing cellulitis or fistula. No loculated abscess cavity identified.    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 Added automatically from request for surgery [id number] Formatting of this note might be different from the original. Added automatically from request for surgery [id number]   Left medial elbow lymphadenopathy 11/10/2020   See Venous doppler left arm 11/09/20   Left wrist pain 11/10/2020   Polyarthralgia    Possible Peroneal tendonitis of left lower leg 2018   Dx by Podiatry   Presence of (intrauterine) contraceptive device 06/29/2020   Rheumatoid arthritis [facility])     Patient Active Problem List   Diagnosis Date Noted   Allergic rhinitis 12/29/2022   Labial abscess 11/21/2022   Rectal bleeding 06/30/2022   Mucus in stool 06/30/2022   High risk medication use 03/01/2022   Fibroids 02/14/2022   IUD (intrauterine device) in place 02/14/2022   Pap smear abnormality of cervix with LGSIL 01/18/2022   Passage of bloody stools 10/20/2021   Anemia 03/12/2021   Depression, major, single episode, severe ([place]) 01/13/2021   Seropositive rheumatoid  arthritis ([place]) 11/20/2020   Inflammatory arthritis 11/20/2020   Sedimentation rate elevation 11/10/2020   Polyarthralgia 09/10/2020   History of ANA positive 11/18/2018   Tobacco abuse 05/12/2015   HTN (hypertension) 05/12/2015   Hidradenitis suppurativa-chronic and involving multiple sites 02/10/2012   Morbid obesity ([place]) 11/16/2011    Past Surgical History:  Procedure Laterality Date   AXILLARY HIDRADENITIS EXCISION     CESAREAN SECTION     multiple abscess  drainages     VULVECTOMY PARTIAL Left 02/21/2020   [facility]; Dorien Chihuahua, MD    OB History     Gravida  3   Para      Term      Preterm      AB  2   Living  1      SAB      IAB      Ectopic      Multiple      Live Births  1            Home Medications    Prior to Admission medications   Medication Sig Start Date End Date Taking? Authorizing Provider  acetaminophen (TYLENOL) 325 MG tablet Prior to Remicade infusion 05/30/19  Yes [provider]  hydrochlorothiazide (HYDRODIURIL) 25 MG tablet TAKE 1 TABLET(25 MG) BY MOUTH DAILY 04/18/22  Yes Ezequiel Essex, MD  inFLIXimab (REMICADE) 100 MG injection Inject into the vein.   Yes [provider]  methylPREDNISolone (MEDROL DOSEPAK) 4 MG TBPK tablet Take as directed. 02/08/23  Yes Talbot Grumbling, FNP  Multiple Vitamins-Minerals (ONE DAILY CALCIUM/IRON) TABS Take 1 tablet by mouth daily.   Yes [provider]  naproxen (NAPROSYN) 500 MG tablet Take 1 tablet (500 mg total) by mouth 2 (two) times daily with a meal. 11/21/22  Yes Dameron, Luna Fuse, DO  naproxen (NAPROSYN) 500 MG tablet Take 1 tablet (500 mg total) by mouth 2 (two) times daily as needed for moderate pain. 11/21/22  Yes Dameron, Luna Fuse, DO  cholecalciferol (VITAMIN D3) 25 MCG (1000 UNIT) tablet Take 1,000 Units by mouth daily.    [provider]  Cholecalciferol 50 MCG (2000 UT) CAPS Take by mouth.    [provider]  fluticasone (FLONASE) 50 MCG/ACT nasal spray Place 2 sprays into both nostrils daily. 12/29/22   Leslie Dales, DO  megestrol (MEGACE) 40 MG tablet Take 1 tablet (40 mg total) by mouth daily. Can increase to two tablets twice a day in the event of heavy bleeding Patient not taking: Reported on 03/01/2022 02/14/22   Chancy Milroy, MD  oxyCODONE-acetaminophen (PERCOCET) 5-325 MG tablet Take 1 tablet by mouth every 8 (eight) hours as needed for severe pain. 11/22/22   McDiarmid, Blane Ohara, MD   phentermine 15 MG capsule Take 1 tablet by mouth every morning 2 hours after breakfast. 11/22/22   McDiarmid, Blane Ohara, MD  Turmeric 500 MG CAPS Take by mouth.    [provider]    Family History Family History  Problem Relation Age of Onset   Liver disease Father    Hypertension Father    Hypertension Sister    Fibroids Sister    Hypertension Sister    Fibroids Sister    Hypertension Sister    Fibroids Sister    Fibroids Sister    Diabetes Paternal Grandfather    Hypertension Paternal Grandfather    Kidney disease Paternal Uncle     Social History Social History   Tobacco Use   Smoking status: Every  Day    Packs/day: 0.10    Years: 10.00    Total pack years: 1.00    Types: Cigarettes   Smokeless tobacco: Never  Vaping Use   Vaping Use: Never used  Substance Use Topics   Alcohol use: Yes    Alcohol/week: 10.0 standard drinks of alcohol    Types: 10 Shots of liquor per week    Comment: on weekends   Drug use: Not Currently     Allergies   No known allergies   Review of Systems Review of Systems Per HPI  Physical Exam Triage Vital Signs ED Triage Vitals  Enc Vitals Group     BP 02/08/23 1718 (!) 158/79     Pulse Rate 02/08/23 1718 70     Resp 02/08/23 1718 16     Temp 02/08/23 1718 98.4 F (36.9 C)     Temp Source 02/08/23 1718 Oral     SpO2 02/08/23 1718 99 %     Weight 02/08/23 1718 223 lb (101.2 kg)     Height 02/08/23 1718 5' 5"$  (1.651 m)     Head Circumference --      Peak Flow --      Pain Score 02/08/23 1716 10     Pain Loc --      Pain Edu? --      Excl. in [place]? --    No data found.  Updated Vital Signs BP (!) 158/79 (BP Location: Left Arm)   Pulse 70   Temp 98.4 F (36.9 C) (Oral)   Resp 16   Ht 5' 5"$  (1.651 m)   Wt 223 lb (101.2 kg)   LMP  (LMP Unknown)   SpO2 99%   BMI 37.11 kg/m   Visual Acuity Right Eye Distance:   Left Eye Distance:   Bilateral Distance:    Right Eye Near:   Left Eye Near:    Bilateral  Near:     Physical Exam Vitals and nursing note reviewed.  Constitutional:      Appearance: She is obese. She is not ill-appearing or toxic-appearing.  HENT:     Head: Normocephalic and atraumatic.     Right Ear: Hearing and external ear normal.     Left Ear: Hearing and external ear normal.     Nose: Nose normal.     Mouth/Throat:     Lips: Pink.     Mouth: Mucous membranes are moist. No injury.     Tongue: No lesions. Tongue does not deviate from midline.     Palate: No mass and lesions.     Pharynx: Oropharynx is clear. Uvula midline. No pharyngeal swelling, oropharyngeal exudate, posterior oropharyngeal erythema or uvula swelling.     Tonsils: No tonsillar exudate or tonsillar abscesses.  Eyes:     General: Lids are normal. Vision grossly intact. Gaze aligned appropriately.     Extraocular Movements: Extraocular movements intact.     Conjunctiva/sclera: Conjunctivae normal.  Cardiovascular:     Rate and Rhythm: Normal rate and regular rhythm.     Heart sounds: Normal heart sounds, S1 normal and S2 normal.  Pulmonary:     Effort: Pulmonary effort is normal. No respiratory distress.     Breath sounds: Normal breath sounds and air entry.  Musculoskeletal:     Cervical back: Neck supple.     Right knee: Swelling and bony tenderness present. No deformity, effusion, erythema, ecchymosis, lacerations or crepitus. Normal range of motion. Tenderness present over the medial joint line,  lateral joint line and patellar tendon. Normal alignment, normal meniscus and normal patellar mobility.     Instability Tests: Anterior drawer test negative. Posterior drawer test negative.     Left knee: Normal.     Comments: +2 bilateral popliteal pulses.  Bevelyn Buckles' sign negative bilaterally.  Skin:    General: Skin is warm and dry.     Capillary Refill: Capillary refill takes less than 2 seconds.     Findings: No rash.  Neurological:     General: No focal deficit present.     Mental Status: She is  alert and oriented to person, place, and time. Mental status is at baseline.     Cranial Nerves: No dysarthria or facial asymmetry.  Psychiatric:        Mood and Affect: Mood normal.        Speech: Speech normal.        Behavior: Behavior normal.        Thought Content: Thought content normal.        Judgment: Judgment normal.      UC Treatments / Results  Labs (all labs ordered are listed, but only abnormal results are displayed) Labs Reviewed - No data to display  EKG   Radiology No results found.  Procedures Procedures (including critical care time)  Medications Ordered in UC Medications  ketorolac (TORADOL) 30 MG/ML injection 30 mg (has no administration in time range)    Initial Impression / Assessment and Plan / UC Course  I have reviewed the triage vital signs and the nursing notes.  Pertinent labs & imaging results that were available during my care of the patient were reviewed by me and considered in my medical decision making (see chart for details).   1.  Acute pain of right knee Acute pain of right knee is likely due to inflammation secondary to overuse injury as patient frequently stands for long hours for her job.  Deferred imaging today based on stable musculoskeletal exam findings and atraumatic mechanism of injury to the right knee.  RICE advised.  Knee brace provided in clinic.  Advised to follow-up with orthopedic specialist listed on paperwork as needed.  Methylprednisolone Dosepak sent to pharmacy as she has failed treatment with NSAIDs.  Advised to avoid use of NSAIDs while taking methylprednisolone due to increased risk of GI bleeding.  She is to take this with food to avoid stomach upset.  Strict ER return precautions discussed.  She is neurovascularly intact distal to injury/pain.  Low suspicion for infectious etiology of patient's right knee pain.  Ketorolac injection 30 mg IM provided in clinic today for acute pain.  Advised to start taking  methylprednisolone Dosepak tomorrow.  Discussed physical exam and available lab work findings in clinic with patient.  Counseled patient regarding appropriate use of medications and potential side effects for all medications recommended or prescribed today. Discussed red flag signs and symptoms of worsening condition,when to call the PCP office, return to urgent care, and when to seek higher level of care in the emergency department. Patient verbalizes understanding and agreement with plan. All questions answered. Patient discharged in stable condition.    Final Clinical Impressions(s) / UC Diagnoses   Final diagnoses:  Acute pain of right knee     Discharge Instructions      Your knee pain is likely due to inflammation of the tendons and ligaments of the knee triggered by overuse.   Wear the knee brace we provided in the clinic for the next  couple of weeks to provide compression, stability, and comfort.  Please rest, ice, and elevate your right knee to help it heal and decrease inflammation.   Take the steroid dose pak as directed. Do not take any naproxen or ibuprofen while taking steroid. Tylenol 1,089m every 6 hours may be used as needed.   Call the orthopedic provider listed on your discharge paperwork to schedule a follow-up appointment if your symptoms do not improve in the next 1-2 weeks with supportive care.  Return to urgent care if you experience worsening pain, numbness, tingling, change of color in your skin near the injury, or any other concerning symptoms.  I hope you feel better!   ED Prescriptions     Medication Sig Dispense Auth. Provider   methylPREDNISolone (MEDROL DOSEPAK) 4 MG TBPK tablet Take as directed. 1 each STalbot Grumbling FNP      PDMP not reviewed this encounter.   [place]albot Grumbling [place]02/21/24 [place] FNP 02/08/23 [phone]

## 2023-02-08 NOTE — Discharge Instructions (Signed)
Your knee pain is likely due to inflammation of the tendons and ligaments of the knee triggered by overuse.   Wear the knee brace we provided in the clinic for the next couple of weeks to provide compression, stability, and comfort.  Please rest, ice, and elevate your right knee to help it heal and decrease inflammation.   Take the steroid dose pak as directed. Do not take any naproxen or ibuprofen while taking steroid. Tylenol 1,068m every 6 hours may be used as needed.   Call the orthopedic provider listed on your discharge paperwork to schedule a follow-up appointment if your symptoms do not improve in the next 1-2 weeks with supportive care.  Return to urgent care if you experience worsening pain, numbness, tingling, change of color in your skin near the injury, or any other concerning symptoms.  I hope you feel better!

## 2023-03-03 ENCOUNTER — Encounter: Payer: Self-pay | Admitting: Internal Medicine

## 2023-03-23 ENCOUNTER — Ambulatory Visit: Payer: Medicaid Other

## 2023-03-27 ENCOUNTER — Ambulatory Visit: Payer: Medicaid Other

## 2023-04-14 ENCOUNTER — Ambulatory Visit (INDEPENDENT_AMBULATORY_CARE_PROVIDER_SITE_OTHER): Payer: Medicaid Other

## 2023-04-14 VITALS — BP 151/91 | HR 68 | Temp 98.4°F | Resp 16 | Ht 65.0 in | Wt 234.4 lb

## 2023-04-14 DIAGNOSIS — L732 Hidradenitis suppurativa: Secondary | ICD-10-CM

## 2023-04-14 DIAGNOSIS — Z79899 Other long term (current) drug therapy: Secondary | ICD-10-CM | POA: Diagnosis not present

## 2023-04-14 DIAGNOSIS — M1990 Unspecified osteoarthritis, unspecified site: Secondary | ICD-10-CM

## 2023-04-14 DIAGNOSIS — M059 Rheumatoid arthritis with rheumatoid factor, unspecified: Secondary | ICD-10-CM | POA: Diagnosis not present

## 2023-04-14 MED ORDER — SODIUM CHLORIDE 0.9 % IV SOLN
800.0000 mg | INTRAVENOUS | Status: DC
  Administered 2023-04-14: 800 mg via INTRAVENOUS
  Filled 2023-04-14: qty 80

## 2023-04-14 MED ORDER — DIPHENHYDRAMINE HCL 25 MG PO CAPS
25.0000 mg | ORAL_CAPSULE | Freq: Once | ORAL | Status: AC
  Administered 2023-04-14: 25 mg via ORAL
  Filled 2023-04-14: qty 1

## 2023-04-14 MED ORDER — ACETAMINOPHEN 325 MG PO TABS
650.0000 mg | ORAL_TABLET | Freq: Once | ORAL | Status: AC
  Administered 2023-04-14: 650 mg via ORAL
  Filled 2023-04-14: qty 2

## 2023-04-14 NOTE — Progress Notes (Signed)
Diagnosis: Rheumatoid Arthritis  Provider:  Chilton Greathouse MD  Procedure: IV Infusion  IV Type: Peripheral, IV Location: R Forearm  Remicade (Infliximab), Dose: 800mg   Infusion Start Time: 1436  Infusion Stop Time: 1656  Post Infusion IV Care: Peripheral IV Discontinued  Discharge: Condition: Good, Destination: Home . AVS Declined  Performed by:  Nat Math, RN

## 2023-06-09 ENCOUNTER — Ambulatory Visit (INDEPENDENT_AMBULATORY_CARE_PROVIDER_SITE_OTHER): Payer: BLUE CROSS/BLUE SHIELD

## 2023-06-09 ENCOUNTER — Other Ambulatory Visit (INDEPENDENT_AMBULATORY_CARE_PROVIDER_SITE_OTHER): Payer: Medicaid Other

## 2023-06-09 VITALS — BP 150/87 | HR 61 | Temp 98.3°F | Resp 14 | Ht 65.0 in | Wt 234.0 lb

## 2023-06-09 DIAGNOSIS — M059 Rheumatoid arthritis with rheumatoid factor, unspecified: Secondary | ICD-10-CM

## 2023-06-09 DIAGNOSIS — Z79899 Other long term (current) drug therapy: Secondary | ICD-10-CM

## 2023-06-09 DIAGNOSIS — L732 Hidradenitis suppurativa: Secondary | ICD-10-CM

## 2023-06-09 DIAGNOSIS — M1990 Unspecified osteoarthritis, unspecified site: Secondary | ICD-10-CM

## 2023-06-09 LAB — CBC WITH DIFFERENTIAL/PLATELET
Basophils Absolute: 0 10*3/uL (ref 0.0–0.1)
Basophils Relative: 0.4 % (ref 0.0–3.0)
Eosinophils Absolute: 0.1 10*3/uL (ref 0.0–0.7)
Eosinophils Relative: 2.1 % (ref 0.0–5.0)
HCT: 36.9 % (ref 36.0–46.0)
Hemoglobin: 12.3 g/dL (ref 12.0–15.0)
Lymphocytes Relative: 32.1 % (ref 12.0–46.0)
Lymphs Abs: 2.3 10*3/uL (ref 0.7–4.0)
MCHC: 33.2 g/dL (ref 30.0–36.0)
MCV: 97.2 fl (ref 78.0–100.0)
Monocytes Absolute: 0.6 10*3/uL (ref 0.1–1.0)
Monocytes Relative: 9.1 % (ref 3.0–12.0)
Neutro Abs: 4 10*3/uL (ref 1.4–7.7)
Neutrophils Relative %: 56.3 % (ref 43.0–77.0)
Platelets: 213 10*3/uL (ref 150.0–400.0)
RBC: 3.8 Mil/uL — ABNORMAL LOW (ref 3.87–5.11)
RDW: 13.3 % (ref 11.5–15.5)
WBC: 7.1 10*3/uL (ref 4.0–10.5)

## 2023-06-09 LAB — COMPREHENSIVE METABOLIC PANEL
ALT: 13 U/L (ref 0–35)
AST: 15 U/L (ref 0–37)
Albumin: 3.9 g/dL (ref 3.5–5.2)
Alkaline Phosphatase: 62 U/L (ref 39–117)
BUN: 14 mg/dL (ref 6–23)
CO2: 25 mEq/L (ref 19–32)
Calcium: 8.7 mg/dL (ref 8.4–10.5)
Chloride: 109 mEq/L (ref 96–112)
Creatinine, Ser: 0.79 mg/dL (ref 0.40–1.20)
GFR: 88.59 mL/min (ref >60.00)
Glucose, Bld: 106 mg/dL — ABNORMAL HIGH (ref 70–99)
Potassium: 3.7 mEq/L (ref 3.5–5.1)
Sodium: 140 mEq/L (ref 135–145)
Total Bilirubin: 0.2 mg/dL (ref 0.2–1.2)
Total Protein: 7 g/dL (ref 6.0–8.3)

## 2023-06-09 MED ORDER — SODIUM CHLORIDE 0.9 % IV SOLN
800.0000 mg | INTRAVENOUS | Status: DC
  Administered 2023-06-09: 800 mg via INTRAVENOUS
  Filled 2023-06-09: qty 80

## 2023-06-09 MED ORDER — DIPHENHYDRAMINE HCL 25 MG PO CAPS
25.0000 mg | ORAL_CAPSULE | Freq: Once | ORAL | Status: AC
  Administered 2023-06-09: 25 mg via ORAL
  Filled 2023-06-09: qty 1

## 2023-06-09 MED ORDER — ACETAMINOPHEN 325 MG PO TABS
650.0000 mg | ORAL_TABLET | Freq: Once | ORAL | Status: AC
  Administered 2023-06-09: 650 mg via ORAL
  Filled 2023-06-09: qty 2

## 2023-06-09 NOTE — Progress Notes (Signed)
Diagnosis: Rheumatoid Arthritis  Provider:  Chilton Greathouse MD  Procedure: IV Infusion  IV Type: Peripheral, IV Location: R wrist  Remicade (Infliximab), Dose: 800 mg  Infusion Start Time: 1111  Infusion Stop Time: 1319  Post Infusion IV Care: Patient declined observation and Peripheral IV Discontinued  Discharge: Condition: Stable, Destination: Home . AVS Declined  Performed by:  Wyvonne Lenz, RN

## 2023-06-11 LAB — QUANTIFERON-TB GOLD PLUS
Mitogen-NIL: >10 IU/mL
NIL: 0.02 IU/mL
QuantiFERON-TB Gold Plus: NEGATIVE
TB1-NIL: 0.01 IU/mL
TB2-NIL: 0.01 IU/mL

## 2023-06-29 ENCOUNTER — Encounter: Payer: Self-pay | Admitting: Internal Medicine

## 2023-07-06 ENCOUNTER — Encounter: Payer: Self-pay | Admitting: Internal Medicine

## 2023-07-06 ENCOUNTER — Ambulatory Visit (HOSPITAL_COMMUNITY)
Admission: RE | Admit: 2023-07-06 | Discharge: 2023-07-06 | Disposition: A | Discharge status: Home / Self Care | Payer: Medicaid Other | Source: Ambulatory Visit | Attending: Family Medicine | Admitting: Family Medicine

## 2023-07-06 ENCOUNTER — Encounter (HOSPITAL_COMMUNITY): Payer: Self-pay

## 2023-07-06 VITALS — BP 130/83 | HR 75 | Temp 97.9°F | Resp 16

## 2023-07-06 DIAGNOSIS — L02214 Cutaneous abscess of groin: Secondary | ICD-10-CM

## 2023-07-06 MED ORDER — LIDOCAINE-EPINEPHRINE-TETRACAINE (LET) TOPICAL GEL
TOPICAL | Status: AC
  Filled 2023-07-06: qty 3

## 2023-07-06 MED ORDER — LIDOCAINE-EPINEPHRINE 1 %-1:100000 IJ SOLN
INTRAMUSCULAR | Status: AC
  Filled 2023-07-06: qty 1

## 2023-07-06 MED ORDER — DOXYCYCLINE HYCLATE 100 MG PO CAPS: 100.0000 mg | ORAL_CAPSULE | Freq: Two times a day (BID) | ORAL | 0 refills | Status: DC

## 2023-07-06 NOTE — ED Provider Notes (Signed)
[facility]    CSN: [id number] Arrival date & time: 07/06/23  1435      History   Chief Complaint Chief Complaint  Patient presents with   Abscess    Boil in panty line  area - Entered by patient    HPI Rachel Mckinney is a 47 y.o. female.   HPI Patient with a history of hidradenitis presents today with 2 infected abscess to the left groin area.  She reports that they have been present for at least 4 days. She has been trying different home remedies to try and alleviate pain.  She reports that abscesses not ruptured or drained.  She has previously required surgical removal of abscess however is currently on Remicade suppressive therapy and has not had a flare in some time.  She denies fever or chills. Past Medical History:  Diagnosis Date   Abnormal finding on CT scan of Pelvis 11/10/2020   1. Infiltration in the fat of the low pelvis anteriorly, involving the vulva and right groin. Mild skin thickening. These changes are likely to represent cellulitis. No definite abscess collection identified. 2. Posteriorly along the gluteal crease, there is skin thickening and adjacent fatty induration possibly representing cellulitis or fistula. No loculated abscess cavity identified.   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 Added automatically from request for surgery [id number] Formatting of this note might be different from the original. Added automatically from request for surgery [id number]   Left medial elbow lymphadenopathy 11/10/2020    See Venous doppler left arm 11/09/20   Left wrist pain 11/10/2020   Polyarthralgia    Possible Peroneal tendonitis of left lower leg 2018   Dx by Podiatry   Presence of (intrauterine) contraceptive device 06/29/2020   Rheumatoid arthritis [facility])     Patient Active Problem List   Diagnosis Date Noted   Allergic rhinitis 12/29/2022   Labial abscess 11/21/2022   Rectal bleeding 06/30/2022   Mucus in stool 06/30/2022   High risk medication use 03/01/2022   Fibroids 02/14/2022   IUD (intrauterine device) in place 02/14/2022   Pap smear abnormality of cervix with LGSIL 01/18/2022   Passage of bloody stools 10/20/2021   Anemia 03/12/2021   Depression, major, single episode, 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/13/2021   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   Inflammatory arthritis 11/20/2020   Sedimentation rate elevation 11/10/2020   Polyarthralgia 09/10/2020   History of ANA positive 11/18/2018   Tobacco abuse 05/12/2015   HTN (hypertension) 05/12/2015   Hidradenitis suppurativa-chronic and involving multiple sites 02/10/2012   Morbid obesity (HCC) 11/16/2011    Past Surgical History:  Procedure Laterality Date   AXILLARY HIDRADENITIS EXCISION     CESAREAN SECTION     multiple abscess drainages     VULVECTOMY PARTIAL Left 02/21/2020   [facility]; Christianne Borrow, MD    OB History     Gravida  3   Para      Term      Preterm      AB  2  Living  1      SAB      IAB      Ectopic      Multiple      Live Births  1            Home Medications    Prior to Admission medications   Medication Sig Start Date End Date Taking? Authorizing Provider  doxycycline (VIBRAMYCIN) 100 MG capsule Take 1 capsule (100 mg total) by mouth 2 (two) times daily. 07/06/23  Yes Bing Neighbors, NP  acetaminophen (TYLENOL) 325 MG tablet Prior to Remicade infusion 05/30/19   [provider]  cholecalciferol (VITAMIN D3) 25 MCG (1000 UNIT) tablet Take 1,000 Units by mouth  daily.    [provider]  Cholecalciferol 50 MCG (2000 UT) CAPS Take by mouth.    [provider]  fluticasone (FLONASE) 50 MCG/ACT nasal spray Place 2 sprays into both nostrils daily. 12/29/22   Tiffany Kocher, DO  hydrochlorothiazide (HYDRODIURIL) 25 MG tablet TAKE 1 TABLET(25 MG) BY MOUTH DAILY 04/18/22   Valetta Close, MD  inFLIXimab (REMICADE) 100 MG injection Inject into the vein.    [provider]  megestrol (MEGACE) 40 MG tablet Take 1 tablet (40 mg total) by mouth daily. Can increase to two tablets twice a day in the event of heavy bleeding Patient not taking: Reported on 03/01/2022 02/14/22   Hermina Staggers, MD  methylPREDNISolone (MEDROL DOSEPAK) 4 MG TBPK tablet Take as directed. 02/08/23   Carlisle Beers, FNP  Multiple Vitamins-Minerals (ONE DAILY CALCIUM/IRON) TABS Take 1 tablet by mouth daily.    [provider]  naproxen (NAPROSYN) 500 MG tablet Take 1 tablet (500 mg total) by mouth 2 (two) times daily with a meal. 11/21/22   Dameron, Nolberto Hanlon, DO  naproxen (NAPROSYN) 500 MG tablet Take 1 tablet (500 mg total) by mouth 2 (two) times daily as needed for moderate pain. 11/21/22   Dameron, Nolberto Hanlon, DO  oxyCODONE-acetaminophen (PERCOCET) 5-325 MG tablet Take 1 tablet by mouth every 8 (eight) hours as needed for severe pain. 11/22/22   McDiarmid, Leighton Roach, MD  phentermine 15 MG capsule Take 1 tablet by mouth every morning 2 hours after breakfast. 11/22/22   McDiarmid, Leighton Roach, MD  Turmeric 500 MG CAPS Take by mouth.    [provider]    Family History Family History  Problem Relation Age of Onset   Liver disease Father    Hypertension Father    Hypertension Sister    Fibroids Sister    Hypertension Sister    Fibroids Sister    Hypertension Sister    Fibroids Sister    Fibroids Sister    Diabetes Paternal Grandfather    Hypertension Paternal Grandfather    Kidney disease Paternal Uncle     Social History Social History    Tobacco Use   Smoking status: Every Day    Current packs/day: 0.10    Average packs/day: 0.1 packs/day for 10.0 years (1.0 ttl pk-yrs)    Types: Cigarettes   Smokeless tobacco: Never  Vaping Use   Vaping status: Never Used  Substance Use Topics   Alcohol use: Yes    Alcohol/week: 10.0 standard drinks of alcohol    Types: 10 Shots of liquor per week    Comment: on weekends   Drug use: Not Currently     Allergies   No known allergies   Review of Systems Review of Systems Pertinent negatives listed in HPI  Physical Exam Triage Vital Signs ED Triage Vitals  Encounter Vitals Group     BP 07/06/23 1451 130/83     Systolic BP Percentile --      Diastolic BP Percentile --      Pulse Rate 07/06/23 1451 75     Resp 07/06/23 1451 16     Temp 07/06/23 1451 97.9 F (36.6 C)     Temp Source 07/06/23 1451 Oral     SpO2 07/06/23 1451 98 %     Weight --      Height --      Head Circumference --      Peak Flow --      Pain Score 07/06/23 1453 10     Pain Loc --      Pain Education --      Exclude from Growth Chart --    No data found.  Updated Vital Signs BP 130/83 (BP Location: Left Arm)   Pulse 75   Temp 97.9 F (36.6 C) (Oral)   Resp 16   SpO2 98%   Visual Acuity Right Eye Distance:   Left Eye Distance:   Bilateral Distance:    Right Eye Near:   Left Eye Near:    Bilateral Near:     Physical Exam Vitals reviewed. Exam conducted with a chaperone present.  Constitutional:      Appearance: Normal appearance.  HENT:     Head: Normocephalic and atraumatic.  Eyes:     Extraocular Movements: Extraocular movements intact.  Cardiovascular:     Rate and Rhythm: Normal rate and regular rhythm.  Pulmonary:     Effort: Pulmonary effort is normal.     Breath sounds: Normal breath sounds.  Genitourinary:    Comments: Abscess right groin  Skin:    General: Skin is warm.  Neurological:     General: No focal deficit present.     Mental Status: She is alert.       UC Treatments / Results  Labs (all labs ordered are listed, but only abnormal results are displayed) Labs Reviewed - No data to display  EKG   Radiology No results found.  Procedures Incision and Drainage  Date/Time: 07/06/2023 3:20 PM  Performed by: Bing Neighbors, NP Authorized by: Bing Neighbors, NP   Consent:    Consent obtained:  Verbal   Consent given by:  Patient   Risks discussed:  Bleeding, incomplete drainage and infection   Alternatives discussed:  No treatment Universal protocol:    Patient identity confirmed:  Verbally with patient Location:    Type:  Abscess   Size:  8mm   Location:  Anogenital   Anogenital location:  Perineum Pre-procedure details:    Skin preparation:  Povidone-iodine Sedation:    Sedation type:  None Anesthesia:    Anesthesia method:  Topical application and local infiltration   Topical anesthetic:  LET   Local anesthetic:  Lidocaine 2% WITH epi Procedure type:    Complexity:  Complex Procedure details:    Incision types:  Stab incision   Incision depth:  Dermal   Wound management:  Irrigated with saline   Drainage:  Serosanguinous   Drainage amount:  Copious   Wound treatment:  Wound left open   Packing materials:  1/4 in iodoform gauze Post-procedure details:    Procedure completion:  Tolerated well, no immediate complications  (including critical care time)  Medications Ordered in UC Medications - No data to display  Initial Impression /  Assessment and Plan / UC Course  I have reviewed the triage vital signs and the nursing notes.  Pertinent labs & imaging results that were available during my care of the patient were reviewed by me and considered in my medical decision making (see chart for details).    Abscess of the left groin, I&D successful.  Patient given wound care instructions.  Start doxycycline 100 mg twice daily for 10 days.  Turn precautions given. Final Clinical Impressions(s) / UC  Diagnoses   Final diagnoses:  Abscess of left groin  Abscess 2nd left groin     Discharge Instructions      Start Doxycyline 100 mg twice daily for 10 days. Change dressing within 8 hours and apply bulky dressing and the wound will drain likely for the next 48 hours. Return as needed.     ED Prescriptions     Medication Sig Dispense Auth. Provider   doxycycline (VIBRAMYCIN) 100 MG capsule Take 1 capsule (100 mg total) by mouth 2 (two) times daily. 20 capsule Bing Neighbors, NP      PDMP not reviewed this encounter.   Bing Neighbors, NP 07/06/23 [phone]

## 2023-07-06 NOTE — ED Triage Notes (Signed)
Pt states abscess to her left groin for the past 3 days.

## 2023-07-06 NOTE — Discharge Instructions (Addendum)
Start Doxycyline 100 mg twice daily for 10 days. Change dressing within 8 hours and apply bulky dressing and the wound will drain likely for the next 48 hours. Return as needed.

## 2023-08-04 ENCOUNTER — Ambulatory Visit (INDEPENDENT_AMBULATORY_CARE_PROVIDER_SITE_OTHER): Payer: Medicaid Other

## 2023-08-04 VITALS — BP 136/84 | HR 83 | Temp 98.3°F | Resp 16 | Ht 65.0 in | Wt 231.8 lb

## 2023-08-04 DIAGNOSIS — L732 Hidradenitis suppurativa: Secondary | ICD-10-CM

## 2023-08-04 DIAGNOSIS — M059 Rheumatoid arthritis with rheumatoid factor, unspecified: Secondary | ICD-10-CM | POA: Diagnosis not present

## 2023-08-04 DIAGNOSIS — M1990 Unspecified osteoarthritis, unspecified site: Secondary | ICD-10-CM | POA: Diagnosis not present

## 2023-08-04 DIAGNOSIS — Z79899 Other long term (current) drug therapy: Secondary | ICD-10-CM

## 2023-08-04 MED ORDER — DIPHENHYDRAMINE HCL 25 MG PO CAPS
25.0000 mg | ORAL_CAPSULE | Freq: Once | ORAL | Status: AC
  Administered 2023-08-04: 25 mg via ORAL
  Filled 2023-08-04: qty 1

## 2023-08-04 MED ORDER — ACETAMINOPHEN 325 MG PO TABS
650.0000 mg | ORAL_TABLET | Freq: Once | ORAL | Status: AC
  Administered 2023-08-04: 650 mg via ORAL
  Filled 2023-08-04: qty 2

## 2023-08-04 MED ORDER — SODIUM CHLORIDE 0.9 % IV SOLN
800.0000 mg | INTRAVENOUS | Status: DC
  Administered 2023-08-04: 800 mg via INTRAVENOUS
  Filled 2023-08-04: qty 80

## 2023-08-04 NOTE — Progress Notes (Signed)
Diagnosis: Rheumatoid Arthritis  Provider:  Chilton Greathouse MD  Procedure: IV Infusion  IV Type: Peripheral, IV Location: R Antecubital  Remicade (Infliximab), Dose: 800 mg  Infusion Start Time: 1104  Infusion Stop Time: 1317  Post Infusion IV Care: Peripheral IV Discontinued  Discharge: Condition: Good, Destination: Home . AVS Declined  Performed by:  Rico Ala, LPN

## 2023-08-11 ENCOUNTER — Ambulatory Visit (HOSPITAL_COMMUNITY): Payer: Self-pay

## 2023-08-24 ENCOUNTER — Ambulatory Visit (HOSPITAL_COMMUNITY)
Admission: RE | Admit: 2023-08-24 | Discharge: 2023-08-24 | Disposition: A | Discharge status: Home / Self Care | Payer: Medicaid Other | Source: Ambulatory Visit | Attending: Nurse Practitioner | Admitting: Nurse Practitioner

## 2023-08-24 ENCOUNTER — Encounter (HOSPITAL_COMMUNITY): Payer: Self-pay

## 2023-08-24 VITALS — BP 160/95 | HR 61 | Temp 98.1°F | Resp 18

## 2023-08-24 DIAGNOSIS — L03011 Cellulitis of right finger: Secondary | ICD-10-CM

## 2023-08-24 MED ORDER — SULFAMETHOXAZOLE-TRIMETHOPRIM 800-160 MG PO TABS
1.0000 | ORAL_TABLET | Freq: Two times a day (BID) | ORAL | 0 refills | Status: DC
Start: 2023-08-24 — End: 2024-03-25

## 2023-08-24 NOTE — ED Triage Notes (Signed)
Pt states she has had a swollen right ring finger x 3 days. She states that it was draining the other day.

## 2023-08-24 NOTE — ED Provider Notes (Signed)
[facility]    CSN: [id number] Arrival date & time: 08/24/23  1204      History   Chief Complaint Chief Complaint  Patient presents with   Hand Pain    HPI Rachel Mckinney is a 47 y.o. female.   Patient presents today with swelling and pain to the tip of the right ring finger.  She reports it has been swollen and painful for the past 3 weeks.  She reports about 1 week ago, it popped overnight in bed and she noticed a lot of draining.  Reports the pain got better for couple of days, but then returned.  She denies any recent injury to the nail or nailbed.  Reports she does bite her fingernails and pick at her hangnails.  She denies fevers or nausea/vomiting.  Reports she gets Remicade infusions monthly for hidradenitis suppurativa.  Reports last boil was approximately 1 month ago, she was treated for doxycycline at that time.    Past Medical History:  Diagnosis Date   Abnormal finding on CT scan of Pelvis 11/10/2020   1. Infiltration in the fat of the low pelvis anteriorly, involving the vulva and right groin. Mild skin thickening. These changes are likely to represent cellulitis. No definite abscess collection identified. 2. Posteriorly along the gluteal crease, there is skin thickening and adjacent fatty induration possibly representing cellulitis or fistula. No loculated abscess cavity identified.   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 Added automatically from request for surgery [id number] Formatting of this note might be  different from the original. Added automatically from request for surgery [id number]   Left medial elbow lymphadenopathy 11/10/2020   See Venous doppler left arm 11/09/20   Left wrist pain 11/10/2020   Polyarthralgia    Possible Peroneal tendonitis of left lower leg 2018   Dx by Podiatry   Presence of (intrauterine) contraceptive device 06/29/2020   Rheumatoid arthritis [facility])     Patient Active Problem List   Diagnosis Date Noted   Allergic rhinitis 12/29/2022   Labial abscess 11/21/2022   Rectal bleeding 06/30/2022   Mucus in stool 06/30/2022   High risk medication use 03/01/2022   Fibroids 02/14/2022   IUD (intrauterine device) in place 02/14/2022   Pap smear abnormality of cervix with LGSIL 01/18/2022   Passage of bloody stools 10/20/2021   Anemia 03/12/2021   Depression, major, single episode, 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/13/2021   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   Inflammatory arthritis 11/20/2020   Sedimentation rate elevation 11/10/2020   Polyarthralgia 09/10/2020   History of ANA positive 11/18/2018   Tobacco abuse 05/12/2015   HTN (hypertension) 05/12/2015   Hidradenitis suppurativa-chronic and involving multiple sites 02/10/2012   Morbid obesity (HCC) 11/16/2011    Past Surgical History:  Procedure Laterality Date   AXILLARY HIDRADENITIS EXCISION     CESAREAN SECTION     multiple abscess drainages     VULVECTOMY PARTIAL Left 02/21/2020   [facility]; Christianne Borrow, MD    OB History     Gravida  3  Para      Term      Preterm      AB  2   Living  1      SAB      IAB      Ectopic      Multiple      Live Births  1            Home Medications    Prior to Admission medications   Medication Sig Start Date End Date Taking? Authorizing Provider  Multiple Vitamins-Minerals (ONE DAILY CALCIUM/IRON) TABS Take 1 tablet by mouth daily.   Yes [provider]  sulfamethoxazole-trimethoprim (BACTRIM DS) 800-160 MG tablet Take 1  tablet by mouth 2 (two) times daily. 08/24/23  Yes Valentino Nose, NP  Turmeric 500 MG CAPS Take by mouth.   Yes [provider]  acetaminophen (TYLENOL) 325 MG tablet Prior to Remicade infusion 05/30/19   [provider]  cholecalciferol (VITAMIN D3) 25 MCG (1000 UNIT) tablet Take 1,000 Units by mouth daily.    [provider]  Cholecalciferol 50 MCG (2000 UT) CAPS Take by mouth.    [provider]  fluticasone (FLONASE) 50 MCG/ACT nasal spray Place 2 sprays into both nostrils daily. 12/29/22   Tiffany Kocher, DO  hydrochlorothiazide (HYDRODIURIL) 25 MG tablet TAKE 1 TABLET(25 MG) BY MOUTH DAILY 04/18/22   Valetta Close, MD  inFLIXimab (REMICADE) 100 MG injection Inject into the vein.    [provider]  megestrol (MEGACE) 40 MG tablet Take 1 tablet (40 mg total) by mouth daily. Can increase to two tablets twice a day in the event of heavy bleeding Patient not taking: Reported on 03/01/2022 02/14/22   Hermina Staggers, MD  methylPREDNISolone (MEDROL DOSEPAK) 4 MG TBPK tablet Take as directed. 02/08/23   Carlisle Beers, FNP  naproxen (NAPROSYN) 500 MG tablet Take 1 tablet (500 mg total) by mouth 2 (two) times daily with a meal. 11/21/22   Dameron, Nolberto Hanlon, DO  naproxen (NAPROSYN) 500 MG tablet Take 1 tablet (500 mg total) by mouth 2 (two) times daily as needed for moderate pain. 11/21/22   Dameron, Nolberto Hanlon, DO  oxyCODONE-acetaminophen (PERCOCET) 5-325 MG tablet Take 1 tablet by mouth every 8 (eight) hours as needed for severe pain. 11/22/22   McDiarmid, Leighton Roach, MD  phentermine 15 MG capsule Take 1 tablet by mouth every morning 2 hours after breakfast. 11/22/22   McDiarmid, Leighton Roach, MD    Family History Family History  Problem Relation Age of Onset   Liver disease Father    Hypertension Father    Hypertension Sister    Fibroids Sister    Hypertension Sister    Fibroids Sister    Hypertension Sister    Fibroids Sister    Fibroids Sister     Diabetes Paternal Grandfather    Hypertension Paternal Grandfather    Kidney disease Paternal Uncle     Social History Social History   Tobacco Use   Smoking status: Every Day    Current packs/day: 0.10    Average packs/day: 0.1 packs/day for 10.0 years (1.0 ttl pk-yrs)    Types: Cigarettes   Smokeless tobacco: Never  Vaping Use   Vaping status: Never Used  Substance Use Topics   Alcohol use: Yes    Alcohol/week: 10.0 standard drinks of alcohol    Types: 10 Shots of liquor per week    Comment: on weekends   Drug use: Not Currently  Allergies   No known allergies   Review of Systems Review of Systems Per HPI  Physical Exam Triage Vital Signs ED Triage Vitals  Encounter Vitals Group     BP 08/24/23 1227 (!) 160/95     Systolic BP Percentile --      Diastolic BP Percentile --      Pulse Rate 08/24/23 1227 61     Resp 08/24/23 1227 18     Temp 08/24/23 1227 98.1 F (36.7 C)     Temp Source 08/24/23 1227 Oral     SpO2 08/24/23 1227 98 %     Weight --      Height --      Head Circumference --      Peak Flow --      Pain Score 08/24/23 1225 9     Pain Loc --      Pain Education --      Exclude from Growth Chart --    No data found.  Updated Vital Signs BP (!) 160/95 (BP Location: Right Arm)   Pulse 61   Temp 98.1 F (36.7 C) (Oral)   Resp 18   SpO2 98%   Visual Acuity Right Eye Distance:   Left Eye Distance:   Bilateral Distance:    Right Eye Near:   Left Eye Near:    Bilateral Near:     Physical Exam Vitals and nursing note reviewed.  Constitutional:      General: She is not in acute distress.    Appearance: Normal appearance. She is not toxic-appearing.  HENT:     Mouth/Throat:     Mouth: Mucous membranes are moist.     Pharynx: Oropharynx is clear.  Pulmonary:     Effort: Pulmonary effort is normal. No respiratory distress.  Musculoskeletal:     Comments: Paronychia to right lateral ring finger; no tenderness to the pad of the  fingertip.  Patient is neurovascularly intact distal to the paronychia.  Full range of motion to the right fourth digit.  Skin:    General: Skin is warm and dry.     Capillary Refill: Capillary refill takes less than 2 seconds.     Findings: Abscess present.  Neurological:     Mental Status: She is alert and oriented to person, place, and time.  Psychiatric:        Behavior: Behavior is cooperative.      UC Treatments / Results  Labs (all labs ordered are listed, but only abnormal results are displayed) Labs Reviewed - No data to display  EKG   Radiology No results found.  Procedures Incision and Drainage  Date/Time: 08/24/2023 1:44 PM  Performed by: Valentino Nose, NP Authorized by: Valentino Nose, NP   Consent:    Consent obtained:  Verbal   Consent given by:  Patient   Risks, benefits, and alternatives were discussed: yes     Risks discussed:  Bleeding, pain, incomplete drainage and infection   Alternatives discussed:  Alternative treatment Universal protocol:    Procedure explained and questions answered to patient or proxy's satisfaction: yes     Patient identity confirmed:  Verbally with patient Location:    Type:  Abscess   Location:  Upper extremity   Upper extremity location:  Finger   Finger location:  R ring finger Sedation:    Sedation type:  None Anesthesia:    Anesthesia method:  Topical application   Topical anesthesia: Freeze spray. Procedure type:  Complexity:  Simple Procedure details:    Ultrasound guidance: no     Incision types:  Stab incision   Drainage:  Purulent and bloody   Drainage amount:  Moderate   Wound treatment:  Wound left open   Packing materials:  None Post-procedure details:    Procedure completion:  Tolerated well, no immediate complications  (including critical care time)  Medications Ordered in UC Medications - No data to display  Initial Impression / Assessment and Plan / UC Course  I have reviewed  the triage vital signs and the nursing notes.  Pertinent labs & imaging results that were available during my care of the patient were reviewed by me and considered in my medical decision making (see chart for details).   Patient is well-appearing, afebrile, not tachycardic, not tachypneic, oxygenating well on room air.  Patient is mildly hypertensive in urgent care today.  1. Paronychia of finger of right hand Incision and drainage of paronychia as above Gauze and Coban applied, recommended warm soaks upon returning home Start Bactrim DS twice daily for 7 days; recent GFR greater than 60 Strict ER and return precautions discussed with patient  The patient was given the opportunity to ask questions.  All questions answered to their satisfaction.  The patient is in agreement to this plan.    Final Clinical Impressions(s) / UC Diagnoses   Final diagnoses:  Paronychia of finger of right hand     Discharge Instructions      We drained the fingernail infection today.  Start warm soaks at home.  Start Bactrim DS twice daily for 7 days.  Seek care if your pain does not improve with this treatment.    ED Prescriptions     Medication Sig Dispense Auth. Provider   sulfamethoxazole-trimethoprim (BACTRIM DS) 800-160 MG tablet Take 1 tablet by mouth 2 (two) times daily. 14 tablet Valentino Nose, NP      PDMP not reviewed this encounter.   Valentino Nose, NP 08/24/23 1346

## 2023-08-24 NOTE — Discharge Instructions (Signed)
We drained the fingernail infection today.  Start warm soaks at home.  Start Bactrim DS twice daily for 7 days.  Seek care if your pain does not improve with this treatment.

## 2023-08-29 ENCOUNTER — Telehealth: Payer: Self-pay | Admitting: Pharmacist

## 2023-08-29 ENCOUNTER — Other Ambulatory Visit (HOSPITAL_COMMUNITY): Payer: Self-pay

## 2023-08-29 NOTE — Telephone Encounter (Signed)
Received PAP renewal form from [employer] for [employer].  Patient appears to have two coverages active (one being Medicaid)  Fax: [phone]  Routing to Marinell Blight as FYI for [facility]  Chesley Mires, PharmD, MPH, BCPS, CPP Clinical Pharmacist (Rheumatology and Pulmonology)

## 2023-08-29 NOTE — Telephone Encounter (Signed)
She does have current Medicaid.  I will f/u with [employer] for them to discontinue PAP. Thanks Selena Batten

## 2023-08-30 ENCOUNTER — Telehealth: Payer: Self-pay | Admitting: Pharmacy Technician

## 2023-08-30 NOTE — Telephone Encounter (Addendum)
Patient has new insurance with mediciad Auth has been submitted.  PAP (free-drug) has been discontinued.  Auth Submission: no auth needed Site of care: Site of care: [facility] Payer: [place] medicaid Medication & CPT/J Code(s) submitted: Remicade (Infliximab) J1745 Route of submission (phone, fax, portal): PORTAL Phone # Fax # Auth type: Buy/Bill PB Units/visits requested: 800MG  Q8WKS Reference number: [id number] Approval from:09/06/23 - 12/19/23

## 2023-08-31 ENCOUNTER — Encounter: Payer: Self-pay | Admitting: Internal Medicine

## 2023-09-04 NOTE — Telephone Encounter (Signed)
Rachel Mckinney is faxing documents related to Remicade to Dr. Chilton Greathouse (Pulmonology). We did not prescribe this medication.   Per fax coverage has been DENIED.   Routing to pharmacy tech to advise. Can fax hard copy if needed.

## 2023-09-06 ENCOUNTER — Telehealth: Payer: Self-pay

## 2023-09-06 NOTE — Telephone Encounter (Signed)
Auth Submission: NO AUTH NEEDED Site of care: Site of care: [facility] Payer: Medicaid Medication & CPT/J Code(s) submitted: Remicade (Infliximab) J1745 Route of submission (phone, fax, portal): Fax Phone # Fax #[phone] Auth type: Buy/Bill PB Units/visits requested: 800mg  x 2 doses Reference number:  Approval from: 09/06/23 to 12/19/23

## 2023-09-08 ENCOUNTER — Encounter: Payer: Self-pay | Admitting: Internal Medicine

## 2023-09-29 ENCOUNTER — Ambulatory Visit: Payer: Medicaid Other

## 2023-11-03 ENCOUNTER — Other Ambulatory Visit (INDEPENDENT_AMBULATORY_CARE_PROVIDER_SITE_OTHER): Payer: Managed Care, Other (non HMO)

## 2023-11-03 ENCOUNTER — Ambulatory Visit (INDEPENDENT_AMBULATORY_CARE_PROVIDER_SITE_OTHER): Payer: Managed Care, Other (non HMO)

## 2023-11-03 VITALS — BP 149/82 | HR 60 | Temp 98.0°F | Resp 16 | Ht 65.0 in | Wt 232.0 lb

## 2023-11-03 DIAGNOSIS — M059 Rheumatoid arthritis with rheumatoid factor, unspecified: Secondary | ICD-10-CM

## 2023-11-03 DIAGNOSIS — Z79899 Other long term (current) drug therapy: Secondary | ICD-10-CM

## 2023-11-03 DIAGNOSIS — M1990 Unspecified osteoarthritis, unspecified site: Secondary | ICD-10-CM | POA: Diagnosis not present

## 2023-11-03 DIAGNOSIS — L732 Hidradenitis suppurativa: Secondary | ICD-10-CM | POA: Diagnosis not present

## 2023-11-03 LAB — CBC WITH DIFFERENTIAL/PLATELET
Basophils Absolute: 0 10*3/uL (ref 0.0–0.1)
Basophils Relative: 0.4 % (ref 0.0–3.0)
Eosinophils Absolute: 0.1 10*3/uL (ref 0.0–0.7)
Eosinophils Relative: 1.8 % (ref 0.0–5.0)
HCT: 38.4 % (ref 36.0–46.0)
Hemoglobin: 12.5 g/dL (ref 12.0–15.0)
Lymphocytes Relative: 23.4 % (ref 12.0–46.0)
Lymphs Abs: 1.6 10*3/uL (ref 0.7–4.0)
MCHC: 32.6 g/dL (ref 30.0–36.0)
MCV: 98.7 fL (ref 78.0–100.0)
Monocytes Absolute: 0.5 10*3/uL (ref 0.1–1.0)
Monocytes Relative: 7.8 % (ref 3.0–12.0)
Neutro Abs: 4.5 10*3/uL (ref 1.4–7.7)
Neutrophils Relative %: 66.6 % (ref 43.0–77.0)
Platelets: 242 10*3/uL (ref 150.0–400.0)
RBC: 3.89 Mil/uL (ref 3.87–5.11)
RDW: 13.1 % (ref 11.5–15.5)
WBC: 6.8 10*3/uL (ref 4.0–10.5)

## 2023-11-03 LAB — COMPREHENSIVE METABOLIC PANEL
ALT: 14 U/L (ref 0–35)
AST: 17 U/L (ref 0–37)
Albumin: 4.2 g/dL (ref 3.5–5.2)
Alkaline Phosphatase: 79 U/L (ref 39–117)
BUN: 13 mg/dL (ref 6–23)
CO2: 27 meq/L (ref 19–32)
Calcium: 9.2 mg/dL (ref 8.4–10.5)
Chloride: 106 meq/L (ref 96–112)
Creatinine, Ser: 0.79 mg/dL (ref 0.40–1.20)
GFR: 88.34 mL/min (ref >60.00)
Glucose, Bld: 95 mg/dL (ref 70–99)
Potassium: 4 meq/L (ref 3.5–5.1)
Sodium: 140 meq/L (ref 135–145)
Total Bilirubin: 0.3 mg/dL (ref 0.2–1.2)
Total Protein: 7.6 g/dL (ref 6.0–8.3)

## 2023-11-03 MED ORDER — ACETAMINOPHEN 325 MG PO TABS
650.0000 mg | ORAL_TABLET | Freq: Once | ORAL | Status: AC
  Administered 2023-11-03: 650 mg via ORAL
  Filled 2023-11-03: qty 2

## 2023-11-03 MED ORDER — SODIUM CHLORIDE 0.9 % IV SOLN
800.0000 mg | Freq: Once | INTRAVENOUS | Status: AC
  Administered 2023-11-03: 800 mg via INTRAVENOUS
  Filled 2023-11-03: qty 80

## 2023-11-03 MED ORDER — DIPHENHYDRAMINE HCL 25 MG PO CAPS
25.0000 mg | ORAL_CAPSULE | Freq: Once | ORAL | Status: AC
  Administered 2023-11-03: 25 mg via ORAL
  Filled 2023-11-03: qty 1

## 2023-11-03 NOTE — Progress Notes (Signed)
Diagnosis: Rheumatoid Arthritis  Provider:  Chilton Greathouse MD  Procedure: IV Infusion  IV Type: Peripheral, IV Location: L Antecubital  Remicade (Infliximab), Dose: 800mg   Infusion Start Time: 1004  Infusion Stop Time: 1213  Post Infusion IV Care: Peripheral IV Discontinued  Discharge: Condition: Good, Destination: Home . AVS Declined  Performed by:  Adriana Mccallum, RN

## 2023-12-08 ENCOUNTER — Encounter: Payer: Self-pay | Admitting: Internal Medicine

## 2023-12-19 ENCOUNTER — Other Ambulatory Visit: Payer: Self-pay

## 2023-12-21 ENCOUNTER — Encounter: Payer: Self-pay | Admitting: Internal Medicine

## 2023-12-27 ENCOUNTER — Telehealth: Payer: Self-pay

## 2023-12-27 NOTE — Telephone Encounter (Signed)
 Auth Submission: NO AUTH NEEDED Site of care: Site of care: [facility] Payer: [employer] medicaid Medication & CPT/J Code(s) submitted: Remicade  (Infliximab ) J1745 Route of submission (phone, fax, portal): fax Phone # Fax # Auth type: Buy/Bill PB Units/visits requested: 800mg  every 8 weeks Reference number:  Approval from: 12/27/23 to 12/18/24   [employer] replied to my fax PA that a prior auth is not required. This documentation is in the media tab.

## 2023-12-29 ENCOUNTER — Ambulatory Visit (INDEPENDENT_AMBULATORY_CARE_PROVIDER_SITE_OTHER): Payer: Medicaid Other

## 2023-12-29 ENCOUNTER — Ambulatory Visit: Payer: Medicaid Other

## 2023-12-29 VITALS — BP 150/89 | HR 73 | Temp 98.1°F | Resp 16 | Ht 65.0 in | Wt 225.2 lb

## 2023-12-29 DIAGNOSIS — M1990 Unspecified osteoarthritis, unspecified site: Secondary | ICD-10-CM

## 2023-12-29 DIAGNOSIS — M059 Rheumatoid arthritis with rheumatoid factor, unspecified: Secondary | ICD-10-CM

## 2023-12-29 DIAGNOSIS — L732 Hidradenitis suppurativa: Secondary | ICD-10-CM | POA: Diagnosis not present

## 2023-12-29 DIAGNOSIS — Z79899 Other long term (current) drug therapy: Secondary | ICD-10-CM | POA: Diagnosis not present

## 2023-12-29 MED ORDER — ACETAMINOPHEN 325 MG PO TABS
650.0000 mg | ORAL_TABLET | Freq: Once | ORAL | Status: AC
  Administered 2023-12-29: 650 mg via ORAL
  Filled 2023-12-29: qty 2

## 2023-12-29 MED ORDER — DIPHENHYDRAMINE HCL 25 MG PO CAPS
25.0000 mg | ORAL_CAPSULE | Freq: Once | ORAL | Status: AC
  Administered 2023-12-29: 25 mg via ORAL
  Filled 2023-12-29: qty 1

## 2023-12-29 MED ORDER — SODIUM CHLORIDE 0.9 % IV SOLN
800.0000 mg | Freq: Once | INTRAVENOUS | Status: AC
  Administered 2023-12-29: 800 mg via INTRAVENOUS
  Filled 2023-12-29: qty 80

## 2023-12-29 NOTE — Progress Notes (Signed)
 Diagnosis:   Hidradenitis suppurativa   Provider:  Praveen Mannam MD  Procedure: IV Infusion  IV Type: Peripheral, IV Location: L Antecubital  Remicade  (Infliximab ), Dose: 800mg   Infusion Start Time: 1057  Infusion Stop Time: 1305  Post Infusion IV Care: Patient declined observation  Discharge: Condition: Good, Destination: Home . AVS Declined  Performed by:  Leita FORBES Miles, LPN

## 2024-01-11 ENCOUNTER — Ambulatory Visit: Payer: Medicaid Other

## 2024-01-19 ENCOUNTER — Encounter: Payer: Self-pay | Admitting: Internal Medicine

## 2024-02-06 ENCOUNTER — Encounter: Payer: Self-pay | Admitting: Internal Medicine

## 2024-02-22 ENCOUNTER — Telehealth: Payer: Self-pay | Admitting: Pharmacy Technician

## 2024-02-22 NOTE — Telephone Encounter (Signed)
 Patient has denied claims with [facility] due to coordination of benefits (COB).  Patient will need to call [facility] and inform them that BCBS is not active or primary. I have attempted to reach the patient twice.  Left another v/m 02/22/24. She has an appt 02/23/24, that will need to be cancelled if patient has not reached out to [facility].  Rachel Mckinney

## 2024-02-23 ENCOUNTER — Ambulatory Visit: Payer: Managed Care, Other (non HMO)

## 2024-03-21 ENCOUNTER — Ambulatory Visit

## 2024-03-21 VITALS — BP 143/66 | HR 89 | Ht 65.0 in | Wt 226.2 lb

## 2024-03-21 DIAGNOSIS — H0011 Chalazion right upper eyelid: Secondary | ICD-10-CM

## 2024-03-21 DIAGNOSIS — H00011 Hordeolum externum right upper eyelid: Secondary | ICD-10-CM | POA: Diagnosis not present

## 2024-03-21 NOTE — Patient Instructions (Signed)
 It was great to see you! Thank you for allowing me to participate in your care!   Our plans for today:  - I have placed ophthalmology referral for this stye (we cannot drain it here and urgent cares usually do not drain these either) - Please replace any eye products   Take care and seek immediate care sooner if you develop any concerns.  Levin Erp, MD

## 2024-03-21 NOTE — Progress Notes (Signed)
    SUBJECTIVE:   CHIEF COMPLAINT / HPI: Right eye spot  Discussed the use of AI scribe software for clinical note transcription with the patient, who gave verbal consent to proceed.  History of Present Illness Presents with recurrent styes for the past 2-3 months. She describes the styes as 'popping up' and being quite large, with the most recent one on the right eye persisting for about a month. The styes are not associated with fever, vomiting, or pain with eye movement. She has been managing the styes with hot compresses and two different types of ointments, as well as eye drops. The patient has also made lifestyle changes, such as removing eye makeup at night and reducing daytime makeup use, in an attempt to prevent the styes. She has not changed her mascara bottle. The patient has never been to an eye doctor and does not have one.   PERTINENT  PMH / PSH: reviewed  OBJECTIVE:   BP (!) 143/66   Pulse 89   Ht 5\' 5"  (1.651 m)   Wt 226 lb 3.2 oz (102.6 kg)   SpO2 100%   BMI 37.64 kg/m   General: Well appearing, NAD, awake, alert, responsive to questions Head: Normocephalic atraumatic, hordeolum of margin on right upper eyelid, left lower eyelid, no fluctuance or overlying erythema or warmth nontender to palpation, EOMI Respiratory: chest rises symmetrically,  no increased work of breathing    ASSESSMENT/PLAN:   Assessment & Plan Chalazion of right upper eyelid Right eyelid persisting for a month, unresponsive to conservative treatment. No fever, vomiting, or pain with eye movement.  - Refer to ophthalmology for evaluation and potential incision and drainage. - Contact referral coordinator to expedite ophthalmology appointment/provide number for patient   Levin Erp, MD [facility]

## 2024-03-25 ENCOUNTER — Encounter (HOSPITAL_COMMUNITY): Payer: Self-pay

## 2024-03-25 ENCOUNTER — Encounter: Admitting: Family Medicine

## 2024-03-25 ENCOUNTER — Ambulatory Visit (HOSPITAL_COMMUNITY)
Admission: RE | Admit: 2024-03-25 | Discharge: 2024-03-25 | Disposition: A | Discharge status: Home / Self Care | Source: Ambulatory Visit

## 2024-03-25 VITALS — BP 153/93 | HR 75 | Temp 98.5°F | Resp 16

## 2024-03-25 DIAGNOSIS — H00021 Hordeolum internum right upper eyelid: Secondary | ICD-10-CM | POA: Diagnosis not present

## 2024-03-25 MED ORDER — GENTAMICIN SULFATE 0.3 % OP SOLN: 1.0000 [drp] | Freq: Three times a day (TID) | OPHTHALMIC | 0 refills | Status: AC

## 2024-03-25 MED ORDER — AZELASTINE HCL 0.05 % OP SOLN: 1.0000 [drp] | Freq: Two times a day (BID) | OPHTHALMIC | 12 refills | Status: AC

## 2024-03-25 NOTE — Discharge Instructions (Addendum)
   1. Hordeolum internum of right upper eyelid (Primary) - azelastine (OPTIVAR) 0.05 % ophthalmic solution; Place 1 drop into both eyes 2 (two) times daily.  Dispense: 6 mL; Refill: 12 - gentamicin (GARAMYCIN) 0.3 % ophthalmic solution; Place 1 drop into both eyes 3 (three) times daily.  Dispense: 5 mL; Refill: 0 - Ambulatory referral to Ophthalmology for follow-up evaluation for persistent bilateral styes. -Use warm water and baby shampoo to massage gently the upper eyelid and any other area where stye appears. -Use warm compresses 2-3 times a day for 10 to 15 minutes at a time to help break up blockage and open up hordeolum internum.

## 2024-03-25 NOTE — ED Triage Notes (Signed)
 Patient here today with c/o bumps on both eyelids X 1 month and the right is worse than the left. Patient has tried using stye ointment and eyedrops with no relief. Patient states that when she wakes up in the morning her eyes are crusted over.

## 2024-03-25 NOTE — ED Provider Notes (Signed)
 [facility] [place]  Note:  This document was prepared using Dragon voice recognition software and may include unintentional dictation errors.  MRN: [id number] DOB: March 11, 1976  Subjective:   Rachel Mckinney is a 48 y.o. female presenting for swelling to the right upper eyelid x 1 month and multiple other swollen areas to eyelids for the same amount of time.  Patient reports that right upper eyelid is by far the worst.  Patient has been using over-the-counter eyedrops and stye ointments with minimal improvement.  Patient was referred to ophthalmology by her primary care provider but she has not been able to secure an appointment.  Patient has tried everything she could think of but is unable to improve symptoms.  Patient reports mucus discharge every morning crusting her eyes shut.  She is concern for secondary infection.  Patient has history of styes but has never had any this severe.  No current facility-administered medications for this encounter.  Current Outpatient Medications:    azelastine (OPTIVAR) 0.05 % ophthalmic solution, Place 1 drop into both eyes 2 (two) times daily., Disp: 6 mL, Rfl: 12   gentamicin (GARAMYCIN) 0.3 % ophthalmic solution, Place 1 drop into both eyes 3 (three) times daily., Disp: 5 mL, Rfl: 0   cholecalciferol (VITAMIN D3) 25 MCG (1000 UNIT) tablet, Take 1,000 Units by mouth daily., Disp: , Rfl:    Cholecalciferol 50 MCG (2000 UT) CAPS, Take by mouth., Disp: , Rfl:    hydrochlorothiazide (HYDRODIURIL) 25 MG tablet, TAKE 1 TABLET(25 MG) BY MOUTH DAILY, Disp: 90 tablet, Rfl: 3   inFLIXimab (REMICADE) 100 MG injection, Inject into the vein., Disp: , Rfl:    Multiple Vitamins-Minerals (ONE DAILY CALCIUM/IRON) TABS, Take 1 tablet by mouth daily., Disp: , Rfl:    Allergies  Allergen Reactions   No Known Allergies     Past Medical History:  Diagnosis Date   Abnormal finding on CT scan of Pelvis 11/10/2020   1. Infiltration in the fat of the low pelvis  anteriorly, involving the vulva and right groin. Mild skin thickening. These changes are likely to represent cellulitis. No definite abscess collection identified. 2. Posteriorly along the gluteal crease, there is skin thickening and adjacent fatty induration possibly representing cellulitis or fistula. No loculated abscess cavity identified.   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 Added automatically from request for surgery [id number] Formatting of this note might be different from the original. Added automatically from request for surgery [id number]   Left medial elbow lymphadenopathy 11/10/2020   See Venous doppler left arm 11/09/20   Left wrist pain 11/10/2020   Polyarthralgia    Possible Peroneal tendonitis of left lower leg 2018   Dx by Podiatry   Presence of (intrauterine) contraceptive device 06/29/2020   Rheumatoid arthritis [facility])      Past Surgical History:  Procedure Laterality Date   AXILLARY HIDRADENITIS EXCISION     CESAREAN SECTION     multiple abscess drainages     VULVECTOMY PARTIAL Left 02/21/2020   [facility]; Christianne Borrow, MD  Family History  Problem Relation Age of Onset   Liver disease Father    Hypertension Father    Hypertension Sister    Fibroids Sister    Hypertension Sister    Fibroids Sister    Hypertension Sister    Fibroids Sister    Fibroids Sister    Diabetes Paternal Grandfather    Hypertension Paternal Grandfather    Kidney disease Paternal Uncle     Social History   Tobacco Use   Smoking  status: Every Day    Current packs/day: 0.10    Average packs/day: 0.1 packs/day for 10.0 years (1.0 ttl pk-yrs)    Types: Cigarettes   Smokeless tobacco: Never  Vaping Use   Vaping status: Never Used  Substance Use Topics   Alcohol use: Yes    Alcohol/week: 10.0 standard drinks of alcohol    Types: 10 Shots of liquor per week    Comment: on weekends   Drug use: Not Currently    ROS Refer to HPI for ROS details.  Objective:   Vitals: BP (!) 153/93 (BP Location: Right Arm)   Pulse 75   Temp 98.5 F (36.9 C) (Oral)   Resp 16   LMP  (LMP Unknown)   SpO2 98%   Physical Exam Vitals and nursing note reviewed.  Constitutional:      General: She is not in acute distress.    Appearance: Normal appearance. She is well-developed. She is not ill-appearing or toxic-appearing.  HENT:     Head: Normocephalic.     Nose: No congestion or rhinorrhea.  Eyes:     General: Vision grossly intact. Gaze aligned appropriately. No visual field deficit.       Right eye: Hordeolum present. No foreign body or discharge.        Left eye: Hordeolum present.No foreign body or discharge.     Extraocular Movements: Extraocular movements intact.     Right eye: Normal extraocular motion.     Left eye: Normal extraocular motion.     Conjunctiva/sclera: Conjunctivae normal.     Right eye: Right conjunctiva is not injected. No exudate or hemorrhage.    Left eye: Left conjunctiva is not injected. No exudate or hemorrhage.    Pupils: Pupils are equal, round, and reactive to light.  Cardiovascular:     Rate and Rhythm: Normal rate and regular rhythm.     Heart sounds: No murmur heard. Pulmonary:     Effort: Pulmonary effort is normal. No respiratory distress.     Breath sounds: Normal breath sounds.  Abdominal:     Palpations: Abdomen is soft.     Tenderness: There is no abdominal tenderness.  Musculoskeletal:        General: No swelling.     Cervical back: Neck supple.  Skin:    General: Skin is  warm and dry.     Capillary Refill: Capillary refill takes less than 2 seconds.  Neurological:     Mental Status: She is alert.  Psychiatric:        Mood and Affect: Mood normal.     Procedures  No results found for this or any previous visit (from the past 24 hours).  Assessment and Plan :   PDMP not reviewed this encounter.  1. Hordeolum internum of right upper eyelid      1. Hordeolum internum of right upper eyelid (Primary) - azelastine (OPTIVAR) 0.05 % ophthalmic solution; Place 1 drop into both eyes 2 (two) times daily.  Dispense: 6  mL; Refill: 12 - gentamicin (GARAMYCIN) 0.3 % ophthalmic solution; Place 1 drop into both eyes 3 (three) times daily.  Dispense: 5 mL; Refill: 0 - Ambulatory referral to Ophthalmology for follow-up evaluation for persistent bilateral styes. -Use warm water and baby shampoo to massage gently the upper eyelid and any other area where stye appears. -Use warm compresses 2-3 times a day for 10 to 15 minutes at a time to help break up blockage and open up hordeolum internum. -Continue to monitor symptoms for any change in severity if there is any escalation of current symptoms or development of new symptoms follow-up in ER for further evaluation and management.  Lucky Cowboy   [place], [place], [place] 03/25/24 [phone]

## 2024-03-28 ENCOUNTER — Encounter: Admitting: Family Medicine

## 2024-03-28 NOTE — Progress Notes (Deleted)
    SUBJECTIVE:   CHIEF COMPLAINT / HPI:   *** Cardiovascular: - Risk as of ***(date): *** (assessment every 3-5 years) - Dx Hypertension: {YES/NO/WILD ZOXWR:60454}  - Dx Hyperlipidemia: {YES/NO/WILD UJWJX:91478} (once age 49-21; high risk >35yo, low risk >45)*** - Dx Obesity: {YES/NO/WILD CARDS:18581} (Class I BMI <34.9, Class II <39.9, Class III < 49.9)*** - Physical Activity: {YES/NO/WILD GNFAO:13086}  - Diabetes: {YES/NO/WILD VHQIO:96295} (age 9-70 w/ BMI >25, or HTN, or HLD) *** (A1c >6.5, or classic Sxs w/ random CBG >200, or FBG >126 (fasting >8hr))***  Cancer: Colorectal >> Colonoscopy: {YES/NO/WILD MWUXL:24401} (Risk factors?, W/o risk factors > 50yo)*** Lung >> Tobacco Use: {YES/NO/WILD UUVOZ:36644} (age 36-74 w/ >23yr/pack/yr Hx, unless quit >60yr ago) ***  - If so, previous Low-Dose CT screen: {YES/NO/WILD IHKVQ:25956}  Breast >> Mammogram: {YES/NO/WILD CARDS:18581} (>40yo to be discussed; Q97yrs)*** Cervical/Endometrial >>  - Vaginal Bleeding: {YES/NO/WILD LOVFI:43329} - Pap Smear: {YES/NO/WILD CARDS:18581} last pap 2023, + HR HPV with LSIL, 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, due for pap  - Previous Abnormal Pap: {YES/NO/WILD CARDS:18581} (21-29yo Q51yrs; >30yo Q53yr w/ neg HPV)*** Skin >> Suspicious lesions: {YES/NO/WILD JJOAC:16606}   Social: Alcohol Use: {YES/NO/WILD TKZSW:10932}  Tobacco Use: {YES/NO/WILD TFTDD:22025}   - Interested in Quitting: {YES/NO/WILD KYHCW:23762}  Other Drugs: {YES/NO/WILD GBTDV:76160}  Risky Sexual Behavior: {YES/NO/WILD CARDS:18581}  - Chlamydia: <25yo, or >25yo and incr risk - Gonorrhea: sexually active <25yo, or at increased risk - Syphilis: If at increased risk - HIV: All individuals (15-18yo once, then annually if risks are high) >> should be checked anytime STDs are checked. - Hep C: once if born in [place] between 1945-1965; or at increased risk - Hep B: If at increased risk*** Depression: {YES/NO/WILD CARDS:18581}   - PHQ9 score:  Support  and Life at Home: {YES/NO/WILD VPXTG:62694}   Other: Osteoporosis: {YES/NO/WILD CARDS:18581} (postmenopausal women <65yo with risk factors -- do BMD screen)*** Zoster Vaccine: {YES/NO/WILD WNIOE:70350} (those >50yo)*** Flu Vaccine: {YES/NO/WILD KXFGH:82993}  Pneumonia Vaccine: {YES/NO/WILD ZJIRC:78938} (those w/ risk factors) - Both: Immunocompromised, cochlear implant, CSF leak, asplenic, sickle cell, CKD - PPSV-23 only: Heart dz, lung disease, DM, tobacco abuse, alcoholism, cirrhosis/liver disease.***   PERTINENT  PMH / PSH: HTN, allergic rhintiis, seropositive RA, hidradenitis suppurativa  OBJECTIVE:   LMP  (LMP Unknown)   ***  ASSESSMENT/PLAN:   Assessment & Plan      Para March, DO [facility]

## 2024-04-23 ENCOUNTER — Telehealth: Payer: Self-pay | Admitting: Pharmacy Technician

## 2024-04-23 NOTE — Telephone Encounter (Addendum)
 Patient has updated COB.  She states she has called [employer] and [facility] and verified that [employer] is her primary insurance. She provided ref# [id number]. Claims will be resubmitted/re-processed.  Patient will be scheduled as soon as possible.  Burdette Carolin

## 2024-05-06 ENCOUNTER — Ambulatory Visit

## 2024-05-06 ENCOUNTER — Other Ambulatory Visit: Payer: Self-pay

## 2024-05-06 VITALS — BP 129/82 | HR 77 | Temp 97.8°F | Resp 18 | Ht 63.0 in | Wt 227.2 lb

## 2024-05-06 DIAGNOSIS — L732 Hidradenitis suppurativa: Secondary | ICD-10-CM | POA: Diagnosis not present

## 2024-05-06 DIAGNOSIS — Z79899 Other long term (current) drug therapy: Secondary | ICD-10-CM

## 2024-05-06 DIAGNOSIS — M059 Rheumatoid arthritis with rheumatoid factor, unspecified: Secondary | ICD-10-CM | POA: Diagnosis not present

## 2024-05-06 DIAGNOSIS — M1990 Unspecified osteoarthritis, unspecified site: Secondary | ICD-10-CM

## 2024-05-06 MED ORDER — DIPHENHYDRAMINE HCL 25 MG PO CAPS
25.0000 mg | ORAL_CAPSULE | Freq: Once | ORAL | Status: AC
  Administered 2024-05-06: 25 mg via ORAL
  Filled 2024-05-06: qty 1

## 2024-05-06 MED ORDER — SODIUM CHLORIDE 0.9 % IV SOLN
800.0000 mg | Freq: Once | INTRAVENOUS | Status: AC
  Administered 2024-05-06: 800 mg via INTRAVENOUS
  Filled 2024-05-06: qty 80

## 2024-05-06 MED ORDER — ACETAMINOPHEN 325 MG PO TABS
650.0000 mg | ORAL_TABLET | Freq: Once | ORAL | Status: AC
  Administered 2024-05-06: 650 mg via ORAL
  Filled 2024-05-06: qty 2

## 2024-05-06 NOTE — Progress Notes (Signed)
 Diagnosis: Hidradenitis suppurativa   Provider:  Praveen Mannam MD  Procedure: IV Infusion  IV Type: Peripheral, IV Location: L Antecubital  Remicade  (Infliximab ), Dose: 800mg   Infusion Start Time: 1214  Infusion Stop Time: 1426  Post Infusion IV Care: Peripheral IV Discontinued  Discharge: Condition: Good, Destination: Home . AVS Declined  Performed by:  Star [place], LPN

## 2024-05-08 LAB — COMPREHENSIVE METABOLIC PANEL WITH GFR
AG Ratio: 1.3 (calc) (ref 1.0–2.5)
ALT: 16 U/L (ref 6–29)
AST: 18 U/L (ref 10–35)
Albumin: 4.3 g/dL (ref 3.6–5.1)
Alkaline phosphatase (APISO): 94 U/L (ref 31–125)
BUN: 15 mg/dL (ref 7–25)
CO2: 28 mmol/L (ref 20–32)
Calcium: 9.5 mg/dL (ref 8.6–10.2)
Chloride: 103 mmol/L (ref 98–110)
Creat: 0.92 mg/dL (ref 0.50–0.99)
Globulin: 3.2 g/dL (ref 1.9–3.7)
Glucose, Bld: 98 mg/dL (ref 65–99)
Potassium: 3.5 mmol/L (ref 3.5–5.3)
Sodium: 140 mmol/L (ref 135–146)
Total Bilirubin: 0.4 mg/dL (ref 0.2–1.2)
Total Protein: 7.5 g/dL (ref 6.1–8.1)
eGFR: 76 mL/min/{1.73_m2} (ref >=60)

## 2024-05-08 LAB — CBC WITH DIFFERENTIAL/PLATELET
Absolute Lymphocytes: 1482 {cells}/uL (ref 850–3900)
Absolute Monocytes: 528 {cells}/uL (ref 200–950)
Basophils Absolute: 18 {cells}/uL (ref 0–200)
Basophils Relative: 0.3 %
Eosinophils Absolute: 78 {cells}/uL (ref 15–500)
Eosinophils Relative: 1.3 %
HCT: 35.1 % (ref 35.0–45.0)
Hemoglobin: 11.5 g/dL — ABNORMAL LOW (ref 11.7–15.5)
MCH: 31.9 pg (ref 27.0–33.0)
MCHC: 32.8 g/dL (ref 32.0–36.0)
MCV: 97.5 fL (ref 80.0–100.0)
MPV: 11.1 fL (ref 7.5–12.5)
Monocytes Relative: 8.8 %
Neutro Abs: 3894 {cells}/uL (ref 1500–7800)
Neutrophils Relative %: 64.9 %
Platelets: 286 10*3/uL (ref 140–400)
RBC: 3.6 10*6/uL — ABNORMAL LOW (ref 3.80–5.10)
RDW: 12.3 % (ref 11.0–15.0)
Total Lymphocyte: 24.7 %
WBC: 6 10*3/uL (ref 3.8–10.8)

## 2024-05-08 LAB — QUANTIFERON-TB GOLD PLUS
Mitogen-NIL: 7.25 [IU]/mL
NIL: 0.01 [IU]/mL
QuantiFERON-TB Gold Plus: NEGATIVE
TB1-NIL: 0 [IU]/mL
TB2-NIL: 0.02 [IU]/mL

## 2024-05-28 ENCOUNTER — Encounter: Payer: Self-pay | Admitting: *Deleted

## 2024-07-01 ENCOUNTER — Ambulatory Visit

## 2024-07-01 ENCOUNTER — Other Ambulatory Visit: Payer: Self-pay

## 2024-07-01 VITALS — BP 153/85 | HR 70 | Temp 98.2°F | Resp 16 | Ht 65.0 in | Wt 220.0 lb

## 2024-07-01 DIAGNOSIS — Z79899 Other long term (current) drug therapy: Secondary | ICD-10-CM

## 2024-07-01 DIAGNOSIS — M1990 Unspecified osteoarthritis, unspecified site: Secondary | ICD-10-CM

## 2024-07-01 DIAGNOSIS — M059 Rheumatoid arthritis with rheumatoid factor, unspecified: Secondary | ICD-10-CM

## 2024-07-01 DIAGNOSIS — L732 Hidradenitis suppurativa: Secondary | ICD-10-CM

## 2024-07-01 LAB — COMPREHENSIVE METABOLIC PANEL WITH GFR
AG Ratio: 1.3 (calc) (ref 1.0–2.5)
ALT: 13 U/L (ref 6–29)
AST: 14 U/L (ref 10–35)
Albumin: 3.9 g/dL (ref 3.6–5.1)
Alkaline phosphatase (APISO): 85 U/L (ref 31–125)
BUN: 17 mg/dL (ref 7–25)
CO2: 24 mmol/L (ref 20–32)
Calcium: 8.9 mg/dL (ref 8.6–10.2)
Chloride: 108 mmol/L (ref 98–110)
Creat: 0.86 mg/dL (ref 0.50–0.99)
Globulin: 3 g/dL (ref 1.9–3.7)
Glucose, Bld: 92 mg/dL (ref 65–99)
Potassium: 3.5 mmol/L (ref 3.5–5.3)
Sodium: 137 mmol/L (ref 135–146)
Total Bilirubin: 0.4 mg/dL (ref 0.2–1.2)
Total Protein: 6.9 g/dL (ref 6.1–8.1)
eGFR: 83 mL/min/1.73m2 (ref >=60)

## 2024-07-01 LAB — CBC WITH DIFFERENTIAL/PLATELET
Absolute Lymphocytes: 2342 {cells}/uL (ref 850–3900)
Absolute Monocytes: 659 {cells}/uL (ref 200–950)
Basophils Absolute: 18 {cells}/uL (ref 0–200)
Basophils Relative: 0.3 %
Eosinophils Absolute: 128 {cells}/uL (ref 15–500)
Eosinophils Relative: 2.1 %
HCT: 34.4 % — ABNORMAL LOW (ref 35.0–45.0)
Hemoglobin: 11.2 g/dL — ABNORMAL LOW (ref 11.7–15.5)
MCH: 31.5 pg (ref 27.0–33.0)
MCHC: 32.6 g/dL (ref 32.0–36.0)
MCV: 96.9 fL (ref 80.0–100.0)
MPV: 11.7 fL (ref 7.5–12.5)
Monocytes Relative: 10.8 %
Neutro Abs: 2952 {cells}/uL (ref 1500–7800)
Neutrophils Relative %: 48.4 %
Platelets: 209 Thousand/uL (ref 140–400)
RBC: 3.55 Million/uL — ABNORMAL LOW (ref 3.80–5.10)
RDW: 11.8 % (ref 11.0–15.0)
Total Lymphocyte: 38.4 %
WBC: 6.1 Thousand/uL (ref 3.8–10.8)

## 2024-07-01 MED ORDER — ACETAMINOPHEN 325 MG PO TABS
650.0000 mg | ORAL_TABLET | Freq: Once | ORAL | Status: AC
  Administered 2024-07-01: 650 mg via ORAL
  Filled 2024-07-01: qty 2

## 2024-07-01 MED ORDER — DIPHENHYDRAMINE HCL 25 MG PO CAPS
25.0000 mg | ORAL_CAPSULE | Freq: Once | ORAL | Status: AC
  Administered 2024-07-01: 25 mg via ORAL
  Filled 2024-07-01: qty 1

## 2024-07-01 MED ORDER — SODIUM CHLORIDE 0.9 % IV SOLN
800.0000 mg | Freq: Once | INTRAVENOUS | Status: AC
  Administered 2024-07-01: 800 mg via INTRAVENOUS
  Filled 2024-07-01: qty 80

## 2024-07-01 NOTE — Progress Notes (Signed)
 Diagnosis: Rheumatoid Arthritis  Provider:  Mannam, Praveen MD  Procedure: IV Infusion  IV Type: Peripheral, IV Location: L Forearm  Remicade  (Infliximab ), Dose: 800 mg  Infusion Start Time: 1216  Infusion Stop Time: 1428  Post Infusion IV Care: Peripheral IV Discontinued  Discharge: Condition: Good, Destination: Home . AVS Declined  Performed by:  Rocky FORBES Sar, RN

## 2024-08-26 ENCOUNTER — Other Ambulatory Visit: Payer: Self-pay

## 2024-08-26 ENCOUNTER — Ambulatory Visit (INDEPENDENT_AMBULATORY_CARE_PROVIDER_SITE_OTHER)

## 2024-08-26 VITALS — BP 161/88 | HR 69 | Temp 98.2°F | Resp 16 | Ht 65.0 in | Wt 219.4 lb

## 2024-08-26 DIAGNOSIS — M059 Rheumatoid arthritis with rheumatoid factor, unspecified: Secondary | ICD-10-CM

## 2024-08-26 DIAGNOSIS — Z79899 Other long term (current) drug therapy: Secondary | ICD-10-CM | POA: Diagnosis not present

## 2024-08-26 DIAGNOSIS — L732 Hidradenitis suppurativa: Secondary | ICD-10-CM | POA: Diagnosis not present

## 2024-08-26 DIAGNOSIS — M1990 Unspecified osteoarthritis, unspecified site: Secondary | ICD-10-CM

## 2024-08-26 MED ORDER — SODIUM CHLORIDE 0.9 % IV SOLN
800.0000 mg | Freq: Once | INTRAVENOUS | Status: AC
  Administered 2024-08-26: 800 mg via INTRAVENOUS
  Filled 2024-08-26: qty 80

## 2024-08-26 MED ORDER — ACETAMINOPHEN 325 MG PO TABS
650.0000 mg | ORAL_TABLET | Freq: Once | ORAL | Status: AC
  Administered 2024-08-26: 650 mg via ORAL
  Filled 2024-08-26: qty 2

## 2024-08-26 MED ORDER — DIPHENHYDRAMINE HCL 25 MG PO CAPS
25.0000 mg | ORAL_CAPSULE | Freq: Once | ORAL | Status: AC
  Administered 2024-08-26: 25 mg via ORAL
  Filled 2024-08-26: qty 1

## 2024-08-26 NOTE — Progress Notes (Signed)
 Diagnosis: Hidradenitis suppurativa   Provider:  Praveen Mannam MD  Procedure: IV Infusion  IV Type: Peripheral, IV Location: L Antecubital  Remicade  (Infliximab ), Dose: 800mg   Infusion Start Time: 1155  Infusion Stop Time: 1408  Post Infusion IV Care: Peripheral IV Discontinued  Discharge: Condition: Good, Destination: Home . AVS Declined  Performed by:  Leita FORBES Miles, LPN

## 2024-08-27 LAB — COMPREHENSIVE METABOLIC PANEL WITH GFR
AG Ratio: 1.4 (calc) (ref 1.0–2.5)
ALT: 14 U/L (ref 6–29)
AST: 18 U/L (ref 10–35)
Albumin: 4.1 g/dL (ref 3.6–5.1)
Alkaline phosphatase (APISO): 88 U/L (ref 31–125)
BUN: 12 mg/dL (ref 7–25)
CO2: 27 mmol/L (ref 20–32)
Calcium: 9 mg/dL (ref 8.6–10.2)
Chloride: 106 mmol/L (ref 98–110)
Creat: 0.92 mg/dL (ref 0.50–0.99)
Globulin: 3 g/dL (ref 1.9–3.7)
Glucose, Bld: 81 mg/dL (ref 65–99)
Potassium: 3.6 mmol/L (ref 3.5–5.3)
Sodium: 139 mmol/L (ref 135–146)
Total Bilirubin: 0.5 mg/dL (ref 0.2–1.2)
Total Protein: 7.1 g/dL (ref 6.1–8.1)
eGFR: 76 mL/min/1.73m2 (ref >=60)

## 2024-08-27 LAB — CBC WITH DIFFERENTIAL/PLATELET
Absolute Lymphocytes: 1604 {cells}/uL (ref 850–3900)
Absolute Monocytes: 574 {cells}/uL (ref 200–950)
Basophils Absolute: 33 {cells}/uL (ref 0–200)
Basophils Relative: 0.5 %
Eosinophils Absolute: 211 {cells}/uL (ref 15–500)
Eosinophils Relative: 3.2 %
HCT: 34.7 % — ABNORMAL LOW (ref 35.0–45.0)
Hemoglobin: 11.5 g/dL — ABNORMAL LOW (ref 11.7–15.5)
MCH: 31.9 pg (ref 27.0–33.0)
MCHC: 33.1 g/dL (ref 32.0–36.0)
MCV: 96.4 fL (ref 80.0–100.0)
MPV: 10.9 fL (ref 7.5–12.5)
Monocytes Relative: 8.7 %
Neutro Abs: 4178 {cells}/uL (ref 1500–7800)
Neutrophils Relative %: 63.3 %
Platelets: 261 Thousand/uL (ref 140–400)
RBC: 3.6 Million/uL — ABNORMAL LOW (ref 3.80–5.10)
RDW: 12.3 % (ref 11.0–15.0)
Total Lymphocyte: 24.3 %
WBC: 6.6 Thousand/uL (ref 3.8–10.8)

## 2024-09-12 ENCOUNTER — Ambulatory Visit: Admitting: Advanced Practice Midwife

## 2024-10-02 ENCOUNTER — Telehealth: Payer: Self-pay | Admitting: Pharmacist

## 2024-10-02 ENCOUNTER — Telehealth: Payer: Self-pay | Admitting: Internal Medicine

## 2024-10-02 NOTE — Telephone Encounter (Signed)
 Patient receiving Remicade  at infusion center but has not been seen since June 2023.  Infusion therapy plan discontinued. She must have follow-up appointment before any further infusions can be received. Message sent to scheduling team  If no appt scheduled, will cancel infusion that is scheduled for 10/21/24  Sherry Pennant, PharmD, MPH, BCPS, CPP Clinical Pharmacist [facility])

## 2024-10-02 NOTE — Telephone Encounter (Signed)
-----   Message from Rachel Mckinney sent at 10/02/2024 12:42 PM EDT ----- Patient overdue for appt. Please advise that infusions for Remicade  are discontinued and she must follow-up with office before she can continue receiving.

## 2024-10-02 NOTE — Telephone Encounter (Signed)
 Attempted to contact patient and left message to advise patient to call the office and schedule appointment.

## 2024-10-03 NOTE — Telephone Encounter (Signed)
 Attempted to contact patient and left message to advise patient to call the office and schedule appointment.

## 2024-10-21 ENCOUNTER — Ambulatory Visit

## 2024-10-30 ENCOUNTER — Ambulatory Visit

## 2024-10-30 ENCOUNTER — Encounter: Payer: Self-pay | Admitting: Internal Medicine

## 2024-10-30 ENCOUNTER — Other Ambulatory Visit: Payer: Self-pay | Admitting: Pharmacist

## 2024-10-30 NOTE — Progress Notes (Signed)
 D/w Dr. Jeannetta - okay for one more infusion to minimize interruption in treatment. If appt is cancelled or no-show, will not be placing further orders.  I called patient and advised of importance of following up with providers especially when on a high-risk medication like infliximab ..  She has fortunately been getting routine lab monitoring with infusions.  Placed one-time order for infliximab . Message sent to [employer].  Sherry Pennant, PharmD, MPH, BCPS, CPP Clinical Pharmacist [facility])

## 2024-11-07 ENCOUNTER — Ambulatory Visit

## 2024-11-07 VITALS — BP 169/96 | HR 66 | Temp 98.1°F | Resp 18 | Ht 65.0 in | Wt 218.4 lb

## 2024-11-07 DIAGNOSIS — M059 Rheumatoid arthritis with rheumatoid factor, unspecified: Secondary | ICD-10-CM

## 2024-11-07 DIAGNOSIS — M1380 Other specified arthritis, unspecified site: Secondary | ICD-10-CM

## 2024-11-07 DIAGNOSIS — L732 Hidradenitis suppurativa: Secondary | ICD-10-CM

## 2024-11-07 DIAGNOSIS — Z79899 Other long term (current) drug therapy: Secondary | ICD-10-CM

## 2024-11-07 MED ORDER — DIPHENHYDRAMINE HCL 25 MG PO CAPS
25.0000 mg | ORAL_CAPSULE | Freq: Once | ORAL | Status: AC
  Administered 2024-11-07: 25 mg via ORAL
  Filled 2024-11-07: qty 1

## 2024-11-07 MED ORDER — ACETAMINOPHEN 325 MG PO TABS
650.0000 mg | ORAL_TABLET | Freq: Once | ORAL | Status: AC
  Administered 2024-11-07: 650 mg via ORAL
  Filled 2024-11-07: qty 2

## 2024-11-07 MED ORDER — SODIUM CHLORIDE 0.9 % IV SOLN
800.0000 mg | Freq: Once | INTRAVENOUS | Status: AC
  Administered 2024-11-07: 800 mg via INTRAVENOUS
  Filled 2024-11-07: qty 80

## 2024-11-07 NOTE — Progress Notes (Signed)
 Diagnosis: Hidradenitis Suppurativa  Provider:  Praveen Mannam MD  Procedure: IV Infusion  IV Type: Peripheral, IV Location: L Antecubital  Remicade  (Infliximab ), Dose: 800mg   Infusion Start Time: 1234  Infusion Stop Time: 1440  Post Infusion IV Care: Peripheral IV Discontinued  Discharge: Condition: Good, Destination: Home . AVS Declined  Performed by:  Jahmai Finelli, RN

## 2024-11-12 NOTE — Progress Notes (Deleted)
    SUBJECTIVE:   Chief compliant/HPI: annual examination  Rachel Mckinney is a 48 y.o. who presents today for an annual exam.   R knee pain  History tabs reviewed and updated ***.   Review of systems form reviewed and notable for ***.   OBJECTIVE:   There were no vitals taken for this visit.  ***  ASSESSMENT/PLAN:   Assessment & Plan Annual physical exam  Annual Examination  See AVS for age appropriate recommendations.   PHQ score ***, reviewed and discussed.  Blood pressure reviewed and at goal ***.  Asked about intimate partner violence and resources given as appropriate  The patient currently uses *** for contraception. Folate recommended as appropriate, minimum of 400 mcg per day.   Considered the following items based upon USPSTF recommendations: Diabetes screening: ordered HIV testing:{FMCANNUALORDERED:33692} Hepatitis C: {FMCANNUALORDERED:33692} Hepatitis B:{FMCANNUALORDERED:33692} Syphilis if at high risk: {FMCANNUALORDERED:33692} GC/CT {GC/CT screening :23818} Lipid panel (nonfasting or fasting) discussed based upon AHA recommendations and ordered.  Consider repeat every 4-6 years.  Reviewed risk factors for latent tuberculosis and {not indicated/requested/declined:14582}   Discussed family history, BRCA testing {not indicated/requested/declined:14582}. Tool used to risk stratify was ***.  Cervical cancer screening: {PAPTYPE:23819} Breast cancer screening: {FMCLIPID:33694} Colorectal cancer screening: {crcscreen:23821::discussed, colonoscopy ordered} if age 56 or over.   Follow up in 1 *** year or sooner if indicated.  MyChart Activation: {MYCHARTLIST:32522}  Izetta Nap, MD [facility]

## 2024-11-12 NOTE — Progress Notes (Unsigned)
 Office Visit Note  Patient: Rachel Mckinney             Date of Birth: [birth year]           MRN: [id number]             PCP: Theophilus Pagan, MD Referring: Theophilus Pagan, MD Visit Date: 11/25/2024   Subjective:  Injections (Right knee injection recommended by urgent care for some cracking and popping )    Discussed the use of AI scribe software for clinical note transcription with the patient, who gave verbal consent to proceed.  History of Present Illness   Rachel Mckinney is a 48 y.o. female here for follow up for RA and HS on remicade  800 mg IV q8wks. She presents for follow-up and new increase in right knee pain.  She experiences increased body aches and feels that the Remicade  infusions are less effective than before. Her arthritis symptoms have worsened compared to a year or two ago. She uses naproxen  for relief, but previously used extra strength Tylenol  and Bayer arthritis without significant benefit.  She has knee pain that originates in the back of the knee and radiates to the hip. 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 She describes her knee as popping and cracking, with stiffness after sitting for extended periods. Work schedule has been worse than usual now and will be through the holiday.  Her nails have been changing color, appearing yellow at times, for the past six to seven months. She also has hand eczema, which she attributes to frequent use of hair products and cleaning agents due to her multiple jobs, including working at the post office, administrator buildings, and doing hair part-time. She uses Dermasil lotion for her eczema but sometimes forgets to apply it.  She experiences cramping and numbness in her hands and feet, with frequent cramping in her feet. She also notes stiffness and tightness from her hip down to her knee, which she believes contributes to her knee instability.       Previous  HPI 06/07/2022 Rachel Mckinney is a 48 y.o. female here for follow up for RA and HS on remicade  800 mg IV q8wks. She continues to have joint pains worst on her left hand and wrist. 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 She has tingling sensation affecting the right great toe intermittently now too. She has not seen as much swelling in the foot compared to before. Skin disease is well controlled. No new infections or major medical events.    Previous HPI 03/01/22 Rachel Mckinney is a 48 y.o. female here to establish care for rheumatoid arthritis and hidradenitis suppurativa on remicade  800 mg IV q8wks. She also has a history of HTN, anemia, depression, and lymphadenopathy. She is transferring care from [facility] with Dr. Mercie due to change in insurance status. She has a longstanding history of HS previously treated with antibiotics and surgeries and was on Humira prior to remicade , with incomplete symptom improvement. She started this 2021 with interruption then Remicade  was restarted since 03/2021 with large improvement in left wrist inflammation. She had her last infusion late November, was scheduled late January but missed this due to needing to transfer care. She feels increasing symptoms since being overdue for treatment joint pain in left elbow, wrist, and fingers also painful swelling of her right great toe. She feels extremely stiff in the base of her neck  and upper back. Skin rashes have not flared up too much yet.     Imaging reviewed MRI of the left wrist 11/09/2020 IMPRESSION:  1. Severe synovitis throughout the radiocarpal joint, midcarpal row, carpometacarpal articulation, and at the articulation between the triquetrum and pisiform bone. Erosions in ill-defined edema along portions of the carpus and metacarpal bases associated with the striking synovitis. Overall findings are highly suspicious for an inflammatory or crystalline arthropathy such as rheumatoid  arthritis. Infectious arthropathy is considered less likely given the multi joint involvement, although is not totally excluded.  2. Tenosynovitis and peritendinitis involving the extensor compartments and flexor tendons.  3. There is some local expansion and edema in the ulnar nerve at the level of the radiocarpal joint, significance uncertain. No obvious impinging lesion is observed in this vicinity.  4. Circumferential subcutaneous edema in the wrist, possibly with some low-grade enhancement-cellulitis not excluded.     Review of Systems  Constitutional:  Negative for fatigue.  HENT:  Negative for mouth sores and mouth dryness.   Eyes:  Negative for dryness.  Respiratory:  Negative for shortness of breath.   Cardiovascular:  Negative for chest pain and palpitations.  Gastrointestinal:  Negative for blood in stool, constipation and diarrhea.  Endocrine: Negative for increased urination.  Genitourinary:  Negative for involuntary urination.  Musculoskeletal:  Positive for joint pain, joint pain, joint swelling, myalgias, muscle weakness, morning stiffness, muscle tenderness and myalgias. Negative for gait problem.  Skin:  Positive for color change. Negative for rash, hair loss and sensitivity to sunlight.  Allergic/Immunologic: Negative for susceptible to infections.  Neurological:  Negative for dizziness and headaches.  Hematological:  Negative for swollen glands.  Psychiatric/Behavioral:  Negative for depressed mood and sleep disturbance. The patient is not nervous/anxious.     PMFS History:  Patient Active Problem List   Diagnosis Date Noted   Screening for tuberculosis 11/28/2024   Unilateral primary osteoarthritis, right knee 11/25/2024   Allergic rhinitis 12/29/2022   Labial abscess 11/21/2022   Rectal bleeding 06/30/2022   Mucus in stool 06/30/2022   High risk medication use 03/01/2022   Fibroids 02/14/2022   IUD (intrauterine device) in place 02/14/2022   Pap smear  abnormality of cervix with LGSIL 01/18/2022   Passage of bloody stools 10/20/2021   Anemia 03/12/2021   Depression, major, single episode, 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/13/2021   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   Inflammatory arthritis 11/20/2020   Sedimentation rate elevation 11/10/2020   Polyarthralgia 09/10/2020   History of ANA positive 11/18/2018   Tobacco abuse 05/12/2015   HTN (hypertension) 05/12/2015   Hidradenitis suppurativa-chronic and involving multiple sites 02/10/2012   Morbid obesity (HCC) 11/16/2011    Past Medical History:  Diagnosis Date   Abnormal finding on CT scan of Pelvis 11/10/2020   1. Infiltration in the fat of the low pelvis anteriorly, involving the vulva and right groin. Mild skin thickening. These changes are likely to represent cellulitis. No definite abscess collection identified. 2. Posteriorly along the gluteal crease, there is skin thickening and adjacent fatty induration possibly representing cellulitis or fistula. No loculated abscess cavity identified.   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 Added automatically from request for surgery [phone] Formatting of this note might be different from the original. Added automatically from request for surgery [id number]   Left medial elbow lymphadenopathy 11/10/2020   See Venous doppler left arm 11/09/20   Left wrist pain 11/10/2020   Polyarthralgia    Possible Peroneal tendonitis of left lower leg 2018   Dx by Podiatry    Presence of (intrauterine) contraceptive device 06/29/2020   Rheumatoid arthritis (HCC)     Family History  Problem Relation Age of Onset   Liver disease Father    Hypertension Father    Hypertension Sister    Fibroids Sister    Hypertension Sister    Fibroids Sister    Hypertension Sister    Fibroids Sister    Fibroids Sister    Diabetes Paternal Grandfather    Hypertension Paternal Grandfather    Kidney disease Paternal Uncle    Past Surgical History:  Procedure Laterality Date   AXILLARY HIDRADENITIS EXCISION     CESAREAN SECTION     multiple abscess drainages     VULVECTOMY PARTIAL Left 02/21/2020   [facility]; Sonny Eck, MD   Social History   Social History Narrative   Not on file   Immunization History  Administered Date(s) Administered   Influenza Split 09/03/2012   Influenza Whole 10/11/2007   Influenza,inj,Quad PF,6+ Mos 10/20/2021, 11/21/2022   Td 09/19/2003     Objective: Vital Signs: BP (!) 157/97   Pulse 61   Temp (!) 97.3 F (36.3 C)   Resp 16   Ht 5' 5 (1.651 m)   Wt 220 lb 3.2 oz (99.9 kg)   BMI 36.64 kg/m    Physical Exam Constitutional:      Appearance: She is obese.  Eyes:     Conjunctiva/sclera: Conjunctivae normal.  Cardiovascular:     Rate and Rhythm: Normal rate and regular rhythm.  Pulmonary:     Effort: Pulmonary effort is normal.     Breath sounds: Normal breath sounds.  Musculoskeletal:     Right lower leg: No edema.     Left lower leg: No edema.  Lymphadenopathy:     Cervical: No cervical adenopathy.  Skin:    General: Skin is warm and dry.     Comments: Eczema present  Neurological:     Mental Status: She is alert.  Psychiatric:        Mood and Affect: Mood normal.      Musculoskeletal Exam:  Shoulders full ROM no tenderness or swelling Elbows full ROM left elbow tenderness to pressure without swelling Wrists full ROM left wrist soft tissue swelling at ulnar side Fingers full ROM, left hand mild lateral  finger deviation Knees full ROM, right knee mildly painful with joint line pressure, no palpable effusion Ankles full ROM no tenderness or swelling Right 1st MTP mild bony changes, callus on medial edge, no palpable swelling   Investigation: No additional findings.  Imaging: DG Knee Complete 4 Views Right Result Date: 11/20/2024 EXAM: 4 OR MORE VIEW(S) XRAY OF THE RIGHT KNEE 11/20/2024 01:20:25 PM COMPARISON: None available. CLINICAL HISTORY: acute on chronic right knee pain FINDINGS: BONES AND JOINTS: No acute fracture. No focal osseous lesion. No joint dislocation. Small knee joint effusion. Mild to moderate tricompartmental osteoarthritis with peripheral and tibial spine spurring. Mild lateral tibiofemoral joint space narrowing. SOFT  TISSUES: The soft tissues are unremarkable. IMPRESSION: 1. No acute abnormalities. 2. Mild to moderate tricompartmental osteoarthritis. 3. Possible Small knee joint effusion. Electronically signed by: Toribio Agreste MD 11/20/2024 01:59 PM EST RP Workstation: [id number]    Recent Labs: Lab Results  Component Value Date   WBC 5.9 11/25/2024   HGB 11.5 (L) 11/25/2024   PLT 238 11/25/2024   NA 140 11/25/2024   K 4.2 11/25/2024   CL 105 11/25/2024   CO2 29 11/25/2024   GLUCOSE 84 11/25/2024   BUN 15 11/25/2024   CREATININE 0.78 11/25/2024   BILITOT 0.5 11/25/2024   ALKPHOS 79 11/03/2023   AST 22 11/25/2024   ALT 13 11/25/2024   PROT 7.3 11/25/2024   ALBUMIN 4.2 11/03/2023   CALCIUM 9.1 11/25/2024   GFRAA 106 11/16/2020   QFTBGOLDPLUS NEGATIVE 05/06/2024    Speciality Comments: No specialty comments available.  Procedures:  Large Joint Inj: R knee on 11/25/2024 9:10 AM Indications: pain Details: 27 G 1.5 in needle, anteromedial approach Medications: 2 mL lidocaine  1 %; 40 mg triamcinolone  acetonide 40 MG/ML Outcome: tolerated well, no immediate complications Procedure, treatment alternatives, risks and benefits explained, specific risks discussed.  Consent was given by the patient. Immediately prior to procedure a time out was called to verify the correct patient, procedure, equipment, support staff and site/side marked as required. Patient was prepped and draped in the usual sterile fashion.     Allergies: No known allergies   Assessment / Plan:     Visit Diagnoses: Seropositive rheumatoid arthritis (HCC) - Plan: Sedimentation rate, C-reactive protein Increased body aches and stiffness, possibly due to decreased Remicade  effectiveness or occupational demands. No changes to regimen for years. No active peripherla synovitis is apprecable on exam suggesting more OA/use related pain. - Ordered sed rate and CRP for assessment rheumatoid arthritis activity, if significantly elevated could adjust infusion dose vs frequency - Continue Remicade  infusions 800 mg IV q8wks. - Continue naproxen  for pain management.  Hidradenitis suppurativa-chronic and involving multiple sites Not complaining of any major interval break out/exacerbations. Did not conducted detailed exam today due to not complaining.  High risk medication use - Plan: CBC with Differential/Platelet, Comprehensive metabolic panel with GFR, Hemoglobin A1c, Lipid panel No serious interval infections. - Checking CBC and CMP for medication monitoring on Remicade  - Checking Hgb A1c, lipids risk factor screening with longstanding RA  Unilateral primary osteoarthritis, right knee - Plan: Large Joint Inj: R knee Knee osteoarthritis with bone spurring and joint space narrowing, exacerbated by occupational demands and hip tightness. - right knee steroid injection today - Provided exercises for hip and knee stability. - Continue naproxen  for pain management.  Osteoarthritis of left thumb Osteoarthritis due to repetitive use and occupational activities.  Hand eczema Chronic hand eczema, possibly exacerbated by occupational exposure. - Advised on protective measures to minimize irritant  exposure. - Continue Dermasil lotion for eczema management.    Orders: Orders Placed This Encounter  Procedures   Large Joint Inj: R knee   Sedimentation rate   C-reactive protein   CBC with Differential/Platelet   Comprehensive metabolic panel with GFR   Hemoglobin A1c   Lipid panel   No orders of the defined types were placed in this encounter.    Follow-Up Instructions: Return in about 3 months (around 02/23/2025) for RA/HS on IFX/inj f/u 3mos.   Lonni LELON Ester, MD  Note - This record has been created using [employer].  Chart creation errors have been sought, but may not  always  have been located. Such creation errors do not reflect on  the standard of medical care.

## 2024-11-13 ENCOUNTER — Encounter: Admitting: Family Medicine

## 2024-11-13 DIAGNOSIS — Z0000 Encounter for general adult medical examination without abnormal findings: Secondary | ICD-10-CM

## 2024-11-19 ENCOUNTER — Ambulatory Visit (HOSPITAL_COMMUNITY): Payer: Self-pay

## 2024-11-20 ENCOUNTER — Encounter (HOSPITAL_COMMUNITY): Payer: Self-pay

## 2024-11-20 ENCOUNTER — Ambulatory Visit (INDEPENDENT_AMBULATORY_CARE_PROVIDER_SITE_OTHER)

## 2024-11-20 ENCOUNTER — Ambulatory Visit (HOSPITAL_COMMUNITY)
Admission: RE | Admit: 2024-11-20 | Discharge: 2024-11-20 | Disposition: A | Discharge status: Home / Self Care | Payer: Self-pay | Source: Ambulatory Visit | Attending: Internal Medicine | Admitting: Internal Medicine

## 2024-11-20 VITALS — BP 154/86 | HR 63 | Temp 97.9°F | Resp 18

## 2024-11-20 DIAGNOSIS — M25561 Pain in right knee: Secondary | ICD-10-CM

## 2024-11-20 DIAGNOSIS — M1711 Unilateral primary osteoarthritis, right knee: Secondary | ICD-10-CM

## 2024-11-20 HISTORY — DX: Unilateral primary osteoarthritis, right knee: M17.11

## 2024-11-20 MED ORDER — NAPROXEN 500 MG PO TABS: 500.0000 mg | ORAL_TABLET | Freq: Two times a day (BID) | ORAL | 0 refills | Status: AC | PRN

## 2024-11-20 NOTE — Discharge Instructions (Signed)
 I prescribed naproxen  for relief of knee pain. Xrays are awaiting radiology review but do not show acute findings or advanced arthritis on my review. I recommend using a compression knee sleeve while working. See Dr. Jeannetta for follow up next week. Return to urgent care if pain acutely worsens.

## 2024-11-20 NOTE — ED Provider Notes (Signed)
 [facility]    CSN: [id number] Arrival date & time: 11/20/24  1228      History   Chief Complaint Chief Complaint  Patient presents with   APPT - knee pain    HPI Rachel Mckinney is a 48 y.o. female presenting to urgent care endorsing acute on chronic right knee pain.  She has a documented history of rheumatoid arthritis.  Followed by Dr. Jeannetta and has follow-up scheduled for 12/8.  She is currently on Remicade  infusions, her last infusion was 11/21.  Rachel Mckinney reports that for the last 2 months she has noted popping along the anterior aspect of her knee with extension.  She works for [employer] and they have recently switched to 12-hour shifts for the holiday season.  She believes this has contributed to her worsening pain.  Pain is worse with extended periods of standing and ambulation in general.  She has tried taking Tylenol  without sustained pain relief.  Naproxen  has been effective for pain relief in the past but she is currently out of medication.  She has not appreciated any significant swelling along the right knee.  Past Medical History:  Diagnosis Date   Abnormal finding on CT scan of Pelvis 11/10/2020   1. Infiltration in the fat of the low pelvis anteriorly, involving the vulva and right groin. Mild skin thickening. These changes are likely to represent cellulitis. No definite abscess collection identified. 2. Posteriorly along the gluteal crease, there is skin thickening and adjacent fatty induration possibly representing cellulitis or fistula. No loculated abscess cavity identified.   Arthralgia of right elbow 03/19/2018   Boils    under breast and inner thigh   De Quervain's syndrome (tenosynovitis) 12/27/2017   Flexor tenosynovitis of left wrist 11/10/2020   Hidradenitis suppurativa    Chronic problem for patient, multiple admissions for surgical drainage   History of ANA positive 11/18/2018   Hypertension    Immunosuppression due to chronic  steroid use 11/10/2020   Large labia majora 01/01/2020   Formatting of this note might be different from the original. Added automatically from request for surgery [id number] Formatting of this note might be different from the original. Added automatically from request for surgery [id number]   Left medial elbow lymphadenopathy 11/10/2020   See Venous doppler left arm 11/09/20   Left wrist pain 11/10/2020   Polyarthralgia    Possible Peroneal tendonitis of left lower leg 2018   Dx by Podiatry   Presence of (intrauterine) contraceptive device 06/29/2020   Rheumatoid arthritis [facility])     Patient Active Problem List   Diagnosis Date Noted   Allergic rhinitis 12/29/2022   Labial abscess 11/21/2022   Rectal bleeding 06/30/2022   Mucus in stool 06/30/2022   High risk medication use 03/01/2022   Fibroids 02/14/2022   IUD (intrauterine device) in place 02/14/2022   Pap smear abnormality of cervix with LGSIL 01/18/2022   Passage of bloody stools 10/20/2021   Anemia 03/12/2021   Depression, major, single episode, 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/13/2021   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   Inflammatory arthritis 11/20/2020   Sedimentation rate elevation 11/10/2020   Polyarthralgia 09/10/2020   History of ANA positive 11/18/2018   Tobacco abuse 05/12/2015   HTN (hypertension) 05/12/2015   Hidradenitis suppurativa-chronic and involving multiple sites 02/10/2012   Morbid obesity (HCC) 11/16/2011    Past Surgical History:  Procedure Laterality Date   AXILLARY HIDRADENITIS EXCISION     CESAREAN SECTION     multiple abscess  drainages     VULVECTOMY PARTIAL Left 02/21/2020   [facility]; Sonny Eck, MD    OB History     Gravida  3   Para      Term      Preterm      AB  2   Living  1      SAB      IAB      Ectopic      Multiple      Live Births  1            Home Medications    Prior to Admission medications   Medication Sig Start Date End Date Taking?  Authorizing Provider  naproxen  (NAPROSYN ) 500 MG tablet Take 1 tablet (500 mg total) by mouth 2 (two) times daily as needed for moderate pain (pain score 4-6). 11/20/24  Yes Melvenia Manus BRAVO, MD  azelastine  (OPTIVAR ) 0.05 % ophthalmic solution Place 1 drop into both eyes 2 (two) times daily. 03/25/24   Reddick, Johnathan B, NP  cholecalciferol (VITAMIN D3) 25 MCG (1000 UNIT) tablet Take 1,000 Units by mouth daily.    [provider]  Cholecalciferol 50 MCG (2000 UT) CAPS Take by mouth.    [provider]  gentamicin  (GARAMYCIN ) 0.3 % ophthalmic solution Place 1 drop into both eyes 3 (three) times daily. 03/25/24   Reddick, Johnathan B, NP  hydrochlorothiazide  (HYDRODIURIL ) 25 MG tablet TAKE 1 TABLET(25 MG) BY MOUTH DAILY 04/18/22   Macario Dorothyann HERO, MD  inFLIXimab  (REMICADE ) 100 MG injection Inject into the vein.    [provider]  Multiple Vitamins-Minerals (ONE DAILY CALCIUM/IRON) TABS Take 1 tablet by mouth daily.    [provider]    Family History Family History  Problem Relation Age of Onset   Liver disease Father    Hypertension Father    Hypertension Sister    Fibroids Sister    Hypertension Sister    Fibroids Sister    Hypertension Sister    Fibroids Sister    Fibroids Sister    Diabetes Paternal Grandfather    Hypertension Paternal Grandfather    Kidney disease Paternal Uncle     Social History Social History   Tobacco Use   Smoking status: Every Day    Current packs/day: 0.10    Average packs/day: 0.1 packs/day for 10.0 years (1.0 ttl pk-yrs)    Types: Cigarettes   Smokeless tobacco: Never  Vaping Use   Vaping status: Never Used  Substance Use Topics   Alcohol use: Yes    Alcohol/week: 10.0 standard drinks of alcohol    Types: 10 Shots of liquor per week    Comment: on weekends   Drug use: Not Currently     Allergies   No known allergies   Review of Systems Review of Systems  Musculoskeletal:        Acute on chronic  right knee pain  All other systems reviewed and are negative.    Physical Exam Triage Vital Signs ED Triage Vitals  Encounter Vitals Group     BP 11/20/24 1259 (!) 154/86     Girls Systolic BP Percentile --      Girls Diastolic BP Percentile --      Boys Systolic BP Percentile --      Boys Diastolic BP Percentile --      Pulse Rate 11/20/24 1259 63     Resp 11/20/24 1259 18     Temp 11/20/24 1259 97.9 F (36.6  C)     Temp Source 11/20/24 1259 Oral     SpO2 11/20/24 1259 99 %     Weight --      Height --      Head Circumference --      Peak Flow --      Pain Score 11/20/24 1257 10     Pain Loc --      Pain Education --      Exclude from Growth Chart --    No data found.  Updated Vital Signs BP (!) 154/86 (BP Location: Left Arm)   Pulse 63   Temp 97.9 F (36.6 C) (Oral)   Resp 18   SpO2 99%    Physical Exam Vitals reviewed.  Constitutional:      Appearance: Normal appearance.  Musculoskeletal:     Comments: No deformity or effusion noted on inspection of the right knee.  ROM from 0-120 degrees of flexion.  There is pain with terminal flexion.  Crepitus with extension.  TTP along the medial and lateral joint lines.  Negative Lachman's.  Equivocal McMurray's.  Grossly NV intact distally.  Patient was observed ambulating in clinic today with minimal antalgic gait.  Neurological:     Mental Status: She is alert.    UC Treatments / Results  Labs (all labs ordered are listed, but only abnormal results are displayed) Labs Reviewed - No data to display  EKG   Radiology No results found.  Procedures Procedures (including critical care time)  Medications Ordered in UC Medications - No data to display  Initial Impression / Assessment and Plan / UC Course  I have reviewed the triage vital signs and the nursing notes.  Pertinent labs & imaging results that were available during my care of the patient were reviewed by me and considered in my medical decision  making (see chart for details).    The patient is a 48 year old female presenting urgent care endorsing acute on chronic right knee pain.  No specific injury or trauma noted.  Pain has gradually worsened over the last 2 months.  It is worse with standing and ambulation.  She has noted crepitus with extension.  4 view x-rays obtained today do not show acute findings or advanced arthritic changes.  She may have mild patellofemoral arthritis.  Naproxen  has been effective previously for pain relief.  With this in mind, I prescribed naproxen  500 mg twice daily as needed.  I recommend that she follow-up with her rheumatologist and PCP for further management.  She should use a compression knee sleeve while working.  I recommended that she return to care if pain significantly worsens.  She is agreeable with this plan and is medically stable for discharge at this time.  Final Clinical Impressions(s) / UC Diagnoses   Final diagnoses:  Acute pain of right knee     Discharge Instructions      I prescribed naproxen  for relief of knee pain. Xrays are awaiting radiology review but do not show acute findings or advanced arthritis on my review. I recommend using a compression knee sleeve while working. See Dr. Jeannetta for follow up next week. Return to urgent care if pain acutely worsens.      ED Prescriptions     Medication Sig Dispense Auth. Provider   naproxen  (NAPROSYN ) 500 MG tablet Take 1 tablet (500 mg total) by mouth 2 (two) times daily as needed for moderate pain (pain score 4-6). 60 tablet Melvenia Manus BRAVO, MD  PDMP not reviewed this encounter.   Melvenia Manus BRAVO, MD 11/20/24 [phone]

## 2024-11-20 NOTE — ED Triage Notes (Signed)
 Pt c/o rt knee pain and popping x2 months. States has appt with rheumatoid arthritis doctor on 12/08. States had an infusion on 11/21 and no relief. States stands on concrete floors at work. States taking extra strength tylenol  and bare. States naproxen  usually helps but ran out. Denies injury.

## 2024-11-21 ENCOUNTER — Ambulatory Visit (HOSPITAL_COMMUNITY): Payer: Self-pay

## 2024-11-25 ENCOUNTER — Encounter: Payer: Self-pay | Admitting: Internal Medicine

## 2024-11-25 ENCOUNTER — Ambulatory Visit: Attending: Internal Medicine | Admitting: Internal Medicine

## 2024-11-25 VITALS — BP 157/97 | HR 61 | Temp 97.3°F | Resp 16 | Ht 65.0 in | Wt 220.2 lb

## 2024-11-25 DIAGNOSIS — M1711 Unilateral primary osteoarthritis, right knee: Secondary | ICD-10-CM | POA: Insufficient documentation

## 2024-11-25 DIAGNOSIS — M059 Rheumatoid arthritis with rheumatoid factor, unspecified: Secondary | ICD-10-CM

## 2024-11-25 DIAGNOSIS — Z79899 Other long term (current) drug therapy: Secondary | ICD-10-CM

## 2024-11-25 DIAGNOSIS — L732 Hidradenitis suppurativa: Secondary | ICD-10-CM

## 2024-11-25 NOTE — Patient Instructions (Signed)
 Straight leg raises, side-lying This exercise strengthens the muscles that move the hip joint away from the center of the body (hip abductors). Lie on your side with your left / right leg in the top position. Lie so your head, shoulder, hip, and knee line up. You may bend your bottom knee slightly to help you balance. Roll your hips slightly forward, so your hips are stacked directly over each other and your left / right knee is facing forward. Leading with your heel, lift your top leg 4-6 inches (10-15 cm). You should feel the muscles in your top hip lifting. Do not let your foot drift forward. Do not let your knee roll toward the ceiling. Hold this position for __________ seconds. Slowly return to the starting position. Let your muscles relax completely between repetitions. Repeat __________ times. Complete this exercise __________ times a day. Straight leg raises, supine This exercise strengthens the muscles in the front of your thigh (quadriceps and hip flexors). Lie on your back (supine position) with your left / right leg extended and your other knee bent. Tense the muscles in the front of your left / right thigh. You should see your kneecap slide up or see increased dimpling just above your knee. Keep these muscles tight as you raise your leg 4-6 inches (10-15 cm) off the floor. Do not let your knee bend. Hold this position for __________ seconds. Keep these muscles tense as you lower your leg. Relax the muscles slowly and completely between repetitions. Repeat __________ times. Complete this exercise __________ times a day. Hip abductors, standing This exercise strengthens the muscles that move the leg and hip joint away from the center of the body (hip abductors). Tie one end of a rubber exercise band or tubing to a secure surface, such as a chair, table, or pole. Loop the other end of the band or tubing around your left / right ankle. Keeping your ankle with the band or tubing  directly opposite the secured end, step away until there is tension in the tubing or band. Hold on to a chair, table, or pole as needed for balance. Lift your left / right leg out to your side. While you do this: Keep your back upright. Keep your shoulders over your hips. Keep your toes pointing forward. Make sure to use your hip muscles to slowly lift your leg. Do not tip your body or forcefully lift your leg. Hold this position for __________ seconds. Slowly return to the starting position. Repeat __________ times. Complete this exercise __________ times a day.

## 2024-11-26 ENCOUNTER — Telehealth: Payer: Self-pay | Admitting: Internal Medicine

## 2024-11-26 LAB — CBC WITH DIFFERENTIAL/PLATELET
Absolute Lymphocytes: 2018 {cells}/uL (ref 850–3900)
Absolute Monocytes: 631 {cells}/uL (ref 200–950)
Basophils Absolute: 18 {cells}/uL (ref 0–200)
Basophils Relative: 0.3 %
Eosinophils Absolute: 189 {cells}/uL (ref 15–500)
Eosinophils Relative: 3.2 %
HCT: 34.9 % — ABNORMAL LOW (ref 35.9–46.0)
Hemoglobin: 11.5 g/dL — ABNORMAL LOW (ref 11.7–15.5)
MCH: 32.2 pg (ref 27.0–33.0)
MCHC: 33 g/dL (ref 31.6–35.4)
MCV: 97.8 fL (ref 81.4–101.7)
MPV: 11.7 fL (ref 7.5–12.5)
Monocytes Relative: 10.7 %
Neutro Abs: 3044 {cells}/uL (ref 1500–7800)
Neutrophils Relative %: 51.6 %
Platelets: 238 Thousand/uL (ref 140–400)
RBC: 3.57 Million/uL — ABNORMAL LOW (ref 3.80–5.10)
RDW: 12 % (ref 11.0–15.0)
Total Lymphocyte: 34.2 %
WBC: 5.9 Thousand/uL (ref 3.8–10.8)

## 2024-11-26 LAB — COMPREHENSIVE METABOLIC PANEL WITH GFR
AG Ratio: 1.4 (calc) (ref 1.0–2.5)
ALT: 13 U/L (ref 6–29)
AST: 22 U/L (ref 10–35)
Albumin: 4.3 g/dL (ref 3.6–5.1)
Alkaline phosphatase (APISO): 73 U/L (ref 31–125)
BUN: 15 mg/dL (ref 7–25)
CO2: 29 mmol/L (ref 20–32)
Calcium: 9.1 mg/dL (ref 8.6–10.2)
Chloride: 105 mmol/L (ref 98–110)
Creat: 0.78 mg/dL (ref 0.50–0.99)
Globulin: 3 g/dL (ref 1.9–3.7)
Glucose, Bld: 84 mg/dL (ref 65–99)
Potassium: 4.2 mmol/L (ref 3.5–5.3)
Sodium: 140 mmol/L (ref 135–146)
Total Bilirubin: 0.5 mg/dL (ref 0.2–1.2)
Total Protein: 7.3 g/dL (ref 6.1–8.1)
eGFR: 93 mL/min/1.73m2 (ref >=60)

## 2024-11-26 LAB — LIPID PANEL
Cholesterol: 166 mg/dL (ref <200)
HDL: 65 mg/dL (ref >=50)
LDL Cholesterol (Calc): 87 mg/dL
Non-HDL Cholesterol (Calc): 101 mg/dL (ref <130)
Total CHOL/HDL Ratio: 2.6 (calc) (ref <5.0)
Triglycerides: 47 mg/dL (ref <150)

## 2024-11-26 LAB — HEMOGLOBIN A1C
Hgb A1c MFr Bld: 5.4 % (ref <5.7)
Mean Plasma Glucose: 108 mg/dL
eAG (mmol/L): 6 mmol/L

## 2024-11-26 LAB — SEDIMENTATION RATE: Sed Rate: 14 mm/h (ref 0–20)

## 2024-11-26 LAB — C-REACTIVE PROTEIN: CRP: <3 mg/L (ref <8.0)

## 2024-11-26 NOTE — Telephone Encounter (Signed)
 Pt is requesting a doctors note for yesterdays appointment with Dr. Jeannetta. Pt would like yo pick it up by tomorrow.

## 2024-11-28 ENCOUNTER — Encounter (HOSPITAL_COMMUNITY): Payer: Self-pay | Admitting: Pharmacist

## 2024-11-28 ENCOUNTER — Telehealth: Payer: Self-pay | Admitting: Pharmacist

## 2024-11-28 ENCOUNTER — Encounter: Payer: Self-pay | Admitting: Internal Medicine

## 2024-11-28 DIAGNOSIS — Z111 Encounter for screening for respiratory tuberculosis: Secondary | ICD-10-CM | POA: Insufficient documentation

## 2024-11-28 NOTE — Telephone Encounter (Signed)
-----   Message from Sherry GORMAN Pennant sent at 11/28/2024  9:05 AM EST ----- Please ensure patient has f/u up in March 2026. Thanks!

## 2024-11-28 NOTE — Telephone Encounter (Signed)
 Referral placed for Remicade  (infliximab ) IV [phone]) at [facility] [phone]) [place] to start benefits investigation. Next Remicade  infusion is due 01/02/2025  Diagnosis: inflammatory arthritis, rheumatoid arthritis (RA), and hidradenitis suppurativa  Provider: Dr. Lonni Ester  Dose: 800mg  IV every 8 weeks Premedications: acetaminophen  650mg  p.o. and diphenhydramine  25mg  p.o. Labs to be drawn with infusions: CBC/CMP every 3 months, TB gold yearly, lipid panel yearly, and A1c annually  Last dose/infusion date: 11/07/2024 Last Clinic Visit: 11/25/2024 Next Clinic Visit: not scheduled; due March 2026  Pertinent baseline labs: TB gold negative 05/06/24, CBC/CMP on 11/25/2024  Once benefits are processed, infusion center will contact patient to schedule.   Sherry Pennant, PharmD, MPH, BCPS, CPP Clinical Pharmacist

## 2024-11-28 NOTE — Telephone Encounter (Signed)
 error

## 2024-12-02 ENCOUNTER — Ambulatory Visit: Payer: Self-pay | Admitting: Internal Medicine

## 2024-12-02 MED ORDER — TRIAMCINOLONE ACETONIDE 40 MG/ML IJ SUSP
40.0000 mg | INTRAMUSCULAR | Status: AC | PRN
  Administered 2024-11-25: 09:00:00 40 mg via INTRA_ARTICULAR

## 2024-12-02 MED ORDER — LIDOCAINE HCL 1 % IJ SOLN
2.0000 mL | INTRAMUSCULAR | Status: AC | PRN
  Administered 2024-11-25: 09:00:00 2 mL

## 2024-12-14 ENCOUNTER — Ambulatory Visit
Admission: EM | Admit: 2024-12-14 | Discharge: 2024-12-14 | Disposition: A | Discharge status: Home / Self Care | Attending: Family Medicine | Admitting: Family Medicine

## 2024-12-14 ENCOUNTER — Encounter: Payer: Self-pay | Admitting: Emergency Medicine

## 2024-12-14 DIAGNOSIS — M069 Rheumatoid arthritis, unspecified: Secondary | ICD-10-CM

## 2024-12-14 MED ORDER — PREDNISONE 10 MG (21) PO TBPK: ORAL_TABLET | Freq: Every day | ORAL | 0 refills | Status: AC

## 2024-12-14 NOTE — Discharge Instructions (Signed)
 Start prednisone  taper as prescribed.  Please follow-up with your rheumatologist next week for further treatment of your rheumatoid arthritis flare.  Please go to the ER if you develop any worsening symptoms.  I hope you feel better soon!

## 2024-12-14 NOTE — ED Triage Notes (Signed)
 Pt reports aching body pain head to toe x3 days. Pt has RA and her provider stopped infusions (last infusion: Nov 2025) due to her needing to follow up in office. Pt reports he was supposed to send out a medication to get her through until the follow up, but it is still not at the pharmacy. Pt is unsure of med name. States in the past naproxen  has helped with her RA pain when needed. Next infusion appt is 2nd week in January. Has taken tylenol  arthritis with no relief. No other associated symptoms or sick contacts.

## 2024-12-14 NOTE — ED Provider Notes (Signed)
 " [facility]    CSN: [id number] Arrival date & time: 12/14/24  1243      History   Chief Complaint Chief Complaint  Patient presents with   Generalized Body Aches    HPI Rachel Mckinney is a 48 y.o. female presents for rheumatoid flare.  Patient has a history of rheumatoid arthritis and is following with rheumatology.  She had been on Remicade  infusions last was in November.  It was stopped due to follow-up concerns.  Patient did see a rheumatologist this month and the infusions are set to resume in January.  She states over the past couple days she has been having a lot of joint pain and swelling.  She works at the [employer] and has been working multiple 12-hour shifts in a row and thinks this is bring it on.  She denies any fevers or URI/flu symptoms.  She has been taking naproxen  which somewhat helps.  No other concerns at this time.  HPI  Past Medical History:  Diagnosis Date   Abnormal finding on CT scan of Pelvis 11/10/2020   1. Infiltration in the fat of the low pelvis anteriorly, involving the vulva and right groin. Mild skin thickening. These changes are likely to represent cellulitis. No definite abscess collection identified. 2. Posteriorly along the gluteal crease, there is skin thickening and adjacent fatty induration possibly representing cellulitis or fistula. No loculated abscess cavity identified.   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 Added automatically from request for  surgery [id number] Formatting of this note might be different from the original. Added automatically from request for surgery [id number]   Left medial elbow lymphadenopathy 11/10/2020   See Venous doppler left arm 11/09/20   Left wrist pain 11/10/2020   Polyarthralgia    Possible Peroneal tendonitis of left lower leg 2018   Dx by Podiatry   Presence of (intrauterine) contraceptive device 06/29/2020   Rheumatoid arthritis [facility])     Patient Active Problem List   Diagnosis Date Noted   Screening for tuberculosis 11/28/2024   Unilateral primary osteoarthritis, right knee 11/25/2024   Allergic rhinitis 12/29/2022   Labial abscess 11/21/2022   Rectal bleeding 06/30/2022   Mucus in stool 06/30/2022   High risk medication use 03/01/2022   Fibroids 02/14/2022   IUD (intrauterine device) in place 02/14/2022   Pap smear abnormality of cervix with LGSIL 01/18/2022   Passage of bloody stools 10/20/2021   Anemia 03/12/2021   Depression, major, single episode, 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/13/2021   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   Inflammatory arthritis 11/20/2020   Sedimentation rate elevation 11/10/2020   Polyarthralgia 09/10/2020   History of ANA positive 11/18/2018   Tobacco abuse 05/12/2015   HTN (hypertension) 05/12/2015   Hidradenitis suppurativa-chronic and involving multiple sites 02/10/2012   Morbid obesity (HCC) 11/16/2011    Past Surgical History:  Procedure Laterality Date   AXILLARY HIDRADENITIS EXCISION     CESAREAN SECTION     multiple abscess drainages  VULVECTOMY PARTIAL Left 02/21/2020   [facility]; Sonny Eck, MD    OB History     Gravida  3   Para      Term      Preterm      AB  2   Living  1      SAB      IAB      Ectopic      Multiple      Live Births  1            Home Medications    Prior to Admission medications  Medication Sig Start Date End Date Taking? Authorizing Provider  cholecalciferol (VITAMIN D3) 25 MCG (1000  UNIT) tablet Take 1,000 Units by mouth daily.   Yes [provider]  hydrochlorothiazide  (HYDRODIURIL ) 25 MG tablet TAKE 1 TABLET(25 MG) BY MOUTH DAILY 04/18/22  Yes Macario Dorothyann HERO, MD  naproxen  (NAPROSYN ) 500 MG tablet Take 1 tablet (500 mg total) by mouth 2 (two) times daily as needed for moderate pain (pain score 4-6). 11/20/24  Yes Melvenia Manus BRAVO, MD  predniSONE  (STERAPRED UNI-PAK 21 TAB) 10 MG (21) TBPK tablet Take by mouth daily. Take 6 tabs by mouth daily  for 1 day, then 5 tabs for 1 day, then 4 tabs for 1 day, then 3 tabs for 1 day, 2 tabs for 1 day, then 1 tab by mouth daily for 1 days 12/14/24  Yes Haelyn Forgey, Jodi R, NP  Turmeric, Darris Jung, POWD Take 1 capsule by mouth daily. 02/04/20  Yes [provider]  azelastine  (OPTIVAR ) 0.05 % ophthalmic solution Place 1 drop into both eyes 2 (two) times daily. Patient not taking: Reported on 11/25/2024 03/25/24   Reddick, Johnathan B, NP  Cholecalciferol 50 MCG (2000 UT) CAPS Take by mouth. Patient not taking: Reported on 11/25/2024    [provider]  gentamicin  (GARAMYCIN ) 0.3 % ophthalmic solution Place 1 drop into both eyes 3 (three) times daily. Patient not taking: Reported on 11/25/2024 03/25/24   Reddick, Johnathan B, NP  inFLIXimab  (REMICADE ) 100 MG injection Inject into the vein.    [provider]  Multiple Vitamins-Minerals (ONE DAILY CALCIUM/IRON) TABS Take 1 tablet by mouth daily.    [provider]    Family History Family History  Problem Relation Age of Onset   Liver disease Father    Hypertension Father    Hypertension Sister    Fibroids Sister    Hypertension Sister    Fibroids Sister    Hypertension Sister    Fibroids Sister    Fibroids Sister    Diabetes Paternal Grandfather    Hypertension Paternal Grandfather    Kidney disease Paternal Uncle     Social History Social History[1]   Allergies   No known allergies   Review of Systems Review of Systems   Musculoskeletal:  Positive for arthralgias.     Physical Exam Triage Vital Signs ED Triage Vitals  Encounter Vitals Group     BP 12/14/24 1414 (!) 142/81     Girls Systolic BP Percentile --      Girls Diastolic BP Percentile --      Boys Systolic BP Percentile --      Boys Diastolic BP Percentile --      Pulse Rate 12/14/24 1414 90     Resp 12/14/24 1414 14     Temp 12/14/24 1414 97.9 F (36.6 C)     Temp Source 12/14/24 1414 Oral  SpO2 12/14/24 1414 98 %     Weight --      Height --      Head Circumference --      Peak Flow --      Pain Score 12/14/24 1415 10     Pain Loc --      Pain Education --      Exclude from Growth Chart --    No data found.  Updated Vital Signs BP (!) 142/81 (BP Location: Left Arm)   Pulse 90   Temp 97.9 F (36.6 C) (Oral)   Resp 14   LMP  (LMP Unknown)   SpO2 98%   Visual Acuity Right Eye Distance:   Left Eye Distance:   Bilateral Distance:    Right Eye Near:   Left Eye Near:    Bilateral Near:     Physical Exam Vitals and nursing note reviewed.  Constitutional:      General: She is not in acute distress.    Appearance: Normal appearance. She is not ill-appearing.  HENT:     Head: Normocephalic and atraumatic.  Eyes:     Pupils: Pupils are equal, round, and reactive to light.  Cardiovascular:     Rate and Rhythm: Normal rate.  Pulmonary:     Effort: Pulmonary effort is normal.  Musculoskeletal:     Comments: Mild swelling of bilateral hands without erythema or warmth.  Full range of motion of wrist, hands, shoulders.  Skin:    General: Skin is warm and dry.  Neurological:     General: No focal deficit present.     Mental Status: She is alert and oriented to person, place, and time.  Psychiatric:        Mood and Affect: Mood normal.        Behavior: Behavior normal.      UC Treatments / Results  Labs (all labs ordered are listed, but only abnormal results are displayed) Labs Reviewed - No data to  display  EKG   Radiology No results found.  Procedures Procedures (including critical care time)  Medications Ordered in UC Medications - No data to display  Initial Impression / Assessment and Plan / UC Course  I have reviewed the triage vital signs and the nursing notes.  Pertinent labs & imaging results that were available during my care of the patient were reviewed by me and considered in my medical decision making (see chart for details).     Will do prednisone  taper for rheumatoid flare.  Advised patient to contact her rheumatologist on Monday for further treatment options.  ER precautions reviewed. Final Clinical Impressions(s) / UC Diagnoses   Final diagnoses:  Rheumatoid arthritis flare [facility])     Discharge Instructions      Start prednisone  taper as prescribed.  Please follow-up with your rheumatologist next week for further treatment of your rheumatoid arthritis flare.  Please go to the ER if you develop any worsening symptoms.  I hope you feel better soon!    ED Prescriptions     Medication Sig Dispense Auth. Provider   predniSONE  (STERAPRED UNI-PAK 21 TAB) 10 MG (21) TBPK tablet Take by mouth daily. Take 6 tabs by mouth daily  for 1 day, then 5 tabs for 1 day, then 4 tabs for 1 day, then 3 tabs for 1 day, 2 tabs for 1 day, then 1 tab by mouth daily for 1 days 21 tablet Jerol Rufener, Jodi R, NP      PDMP not  reviewed this encounter.    [1]  Social History Tobacco Use   Smoking status: Every Day    Current packs/day: 0.10    Average packs/day: 0.1 packs/day for 10.0 years (1.0 ttl pk-yrs)    Types: Cigarettes    Passive exposure: Never   Smokeless tobacco: Never  Vaping Use   Vaping status: Never Used  Substance Use Topics   Alcohol use: Yes    Alcohol/week: 10.0 standard drinks of alcohol    Types: 10 Shots of liquor per week    Comment: on weekends   Drug use: Not Currently     Loreda Myla SAUNDERS, NP 12/14/24 1440  "

## 2024-12-19 ENCOUNTER — Telehealth: Payer: Self-pay

## 2024-12-19 NOTE — Telephone Encounter (Signed)
 Auth Submission: NO AUTH NEEDED Site of care: Site of care: [facility] Payer: [employer] medicaid Medication & CPT/J Code(s) submitted: Remicade  (Infliximab ) J1745 Diagnosis Code:  Route of submission (phone, fax, portal):  Phone # Fax # Auth type: Buy/Bill PB Units/visits requested: 800mg  x 6 doses Reference number:  Approval from: 12/19/24 to 12/18/25

## 2025-01-02 ENCOUNTER — Ambulatory Visit

## 2025-01-02 VITALS — BP 124/81 | HR 70 | Temp 98.1°F | Resp 18 | Ht 65.0 in | Wt 214.8 lb

## 2025-01-02 DIAGNOSIS — M059 Rheumatoid arthritis with rheumatoid factor, unspecified: Secondary | ICD-10-CM | POA: Diagnosis not present

## 2025-01-02 DIAGNOSIS — L732 Hidradenitis suppurativa: Secondary | ICD-10-CM

## 2025-01-02 DIAGNOSIS — M1380 Other specified arthritis, unspecified site: Secondary | ICD-10-CM

## 2025-01-02 DIAGNOSIS — Z79899 Other long term (current) drug therapy: Secondary | ICD-10-CM | POA: Diagnosis not present

## 2025-01-02 MED ORDER — ACETAMINOPHEN 325 MG PO TABS
650.0000 mg | ORAL_TABLET | Freq: Once | ORAL | Status: AC
  Administered 2025-01-02: 650 mg via ORAL
  Filled 2025-01-02: qty 2

## 2025-01-02 MED ORDER — SODIUM CHLORIDE 0.9 % IV SOLN
800.0000 mg | Freq: Once | INTRAVENOUS | Status: AC
  Administered 2025-01-02: 800 mg via INTRAVENOUS
  Filled 2025-01-02: qty 80

## 2025-01-02 MED ORDER — DIPHENHYDRAMINE HCL 25 MG PO CAPS
25.0000 mg | ORAL_CAPSULE | Freq: Once | ORAL | Status: AC
  Administered 2025-01-02: 25 mg via ORAL
  Filled 2025-01-02: qty 1

## 2025-01-02 NOTE — Progress Notes (Signed)
 Diagnosis: Hidradenitis Suppurativa  Provider:  Praveen Mannam MD  Procedure: IV Infusion  IV Type: Peripheral, IV Location: R Forearm  Remicade  (Infliximab ), Dose: 800mg   Infusion Start Time: 1114  Infusion Stop Time: 1349  Post Infusion IV Care: Peripheral IV Discontinued  Discharge: Condition: Good, Destination: Home . AVS Declined  Performed by:  Maria Gallicchio, RN

## 2025-02-24 ENCOUNTER — Ambulatory Visit: Admitting: Internal Medicine

## 2025-02-27 ENCOUNTER — Ambulatory Visit
